# Patient Record
Sex: Female | Born: 1995 | Race: Black or African American | Hispanic: No | Marital: Single | Smoking: Former smoker
Health system: Southern US, Community
[De-identification: ages and names within clinical notes are randomized; demographics above are authoritative.]

## PROBLEM LIST (undated history)

## (undated) ENCOUNTER — Inpatient Hospital Stay (HOSPITAL_COMMUNITY): Payer: Self-pay

## (undated) DIAGNOSIS — D649 Anemia, unspecified: Secondary | ICD-10-CM

## (undated) DIAGNOSIS — Z9109 Other allergy status, other than to drugs and biological substances: Secondary | ICD-10-CM

## (undated) DIAGNOSIS — Z8759 Personal history of other complications of pregnancy, childbirth and the puerperium: Secondary | ICD-10-CM

## (undated) HISTORY — PX: NO PAST SURGERIES: SHX2092

## (undated) HISTORY — DX: Anemia, unspecified: D64.9

---
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
## 2017-01-09 ENCOUNTER — Encounter: Payer: Self-pay | Admitting: Family Medicine

## 2017-01-10 ENCOUNTER — Encounter: Payer: Medicaid Other | Admitting: Obstetrics and Gynecology

## 2017-01-24 ENCOUNTER — Ambulatory Visit (INDEPENDENT_AMBULATORY_CARE_PROVIDER_SITE_OTHER): Payer: Medicaid Other | Admitting: Obstetrics & Gynecology

## 2017-01-24 ENCOUNTER — Encounter: Payer: Self-pay | Admitting: Obstetrics & Gynecology

## 2017-01-24 VITALS — BP 118/71 | HR 76 | Wt 162.8 lb

## 2017-01-24 DIAGNOSIS — O09292 Supervision of pregnancy with other poor reproductive or obstetric history, second trimester: Secondary | ICD-10-CM

## 2017-01-24 DIAGNOSIS — O0990 Supervision of high risk pregnancy, unspecified, unspecified trimester: Secondary | ICD-10-CM

## 2017-01-24 DIAGNOSIS — Z8759 Personal history of other complications of pregnancy, childbirth and the puerperium: Secondary | ICD-10-CM

## 2017-01-24 DIAGNOSIS — O0992 Supervision of high risk pregnancy, unspecified, second trimester: Secondary | ICD-10-CM

## 2017-01-24 MED ORDER — PANTOPRAZOLE SODIUM 20 MG PO TBEC: 20.0000 mg | DELAYED_RELEASE_TABLET | Freq: Every day | ORAL | 4 refills | Status: DC

## 2017-01-24 NOTE — Patient Instructions (Signed)
Second Trimester of Pregnancy The second trimester is from week 13 through week 28, month 4 through 6. This is often the time in pregnancy that you feel your best. Often times, morning sickness has lessened or quit. You may have more energy, and you may get hungry more often. Your unborn baby (fetus) is growing rapidly. At the end of the sixth month, he or she is about 9 inches long and weighs about 1 pounds. You will likely feel the baby move (quickening) between 18 and 20 weeks of pregnancy. Follow these instructions at home:  Avoid all smoking, herbs, and alcohol. Avoid drugs not approved by your doctor.  Do not use any tobacco products, including cigarettes, chewing tobacco, and electronic cigarettes. If you need help quitting, ask your doctor. You may get counseling or other support to help you quit.  Only take medicine as told by your doctor. Some medicines are safe and some are not during pregnancy.  Exercise only as told by your doctor. Stop exercising if you start having cramps.  Eat regular, healthy meals.  Wear a good support bra if your breasts are tender.  Do not use hot tubs, steam rooms, or saunas.  Wear your seat belt when driving.  Avoid raw meat, uncooked cheese, and liter boxes and soil used by cats.  Take your prenatal vitamins.  Take 1500-2000 milligrams of calcium daily starting at the 20th week of pregnancy until you deliver your baby.  Try taking medicine that helps you poop (stool softener) as needed, and if your doctor approves. Eat more fiber by eating fresh fruit, vegetables, and whole grains. Drink enough fluids to keep your pee (urine) clear or pale yellow.  Take warm water baths (sitz baths) to soothe pain or discomfort caused by hemorrhoids. Use hemorrhoid cream if your doctor approves.  If you have puffy, bulging veins (varicose veins), wear support hose. Raise (elevate) your feet for 15 minutes, 3-4 times a day. Limit salt in your diet.  Avoid heavy  lifting, wear low heals, and sit up straight.  Rest with your legs raised if you have leg cramps or low back pain.  Visit your dentist if you have not gone during your pregnancy. Use a soft toothbrush to brush your teeth. Be gentle when you floss.  You can have sex (intercourse) unless your doctor tells you not to.  Go to your doctor visits. Get help if:  You feel dizzy.  You have mild cramps or pressure in your lower belly (abdomen).  You have a nagging pain in your belly area.  You continue to feel sick to your stomach (nauseous), throw up (vomit), or have watery poop (diarrhea).  You have bad smelling fluid coming from your vagina.  You have pain with peeing (urination). Get help right away if:  You have a fever.  You are leaking fluid from your vagina.  You have spotting or bleeding from your vagina.  You have severe belly cramping or pain.  You lose or gain weight rapidly.  You have trouble catching your breath and have chest pain.  You notice sudden or extreme puffiness (swelling) of your face, hands, ankles, feet, or legs.  You have not felt the baby move in over an hour.  You have severe headaches that do not go away with medicine.  You have vision changes. This information is not intended to replace advice given to you by your health care provider. Make sure you discuss any questions you have with your health care   provider. Document Released: 10/20/2009 Document Revised: 01/01/2016 Document Reviewed: 09/26/2012 Elsevier Interactive Patient Education  2017 Elsevier Inc.  

## 2017-01-26 ENCOUNTER — Other Ambulatory Visit: Payer: Self-pay | Admitting: *Deleted

## 2017-01-26 ENCOUNTER — Encounter: Payer: Self-pay | Admitting: Obstetrics & Gynecology

## 2017-01-26 DIAGNOSIS — O0990 Supervision of high risk pregnancy, unspecified, unspecified trimester: Secondary | ICD-10-CM

## 2017-01-26 DIAGNOSIS — Z8759 Personal history of other complications of pregnancy, childbirth and the puerperium: Secondary | ICD-10-CM

## 2017-02-01 ENCOUNTER — Ambulatory Visit (HOSPITAL_COMMUNITY)
Admission: RE | Admit: 2017-02-01 | Discharge: 2017-02-01 | Disposition: A | Discharge status: Home / Self Care | Payer: Medicaid Other | Source: Ambulatory Visit | Attending: Obstetrics & Gynecology | Admitting: Obstetrics & Gynecology

## 2017-02-01 ENCOUNTER — Encounter (HOSPITAL_COMMUNITY): Payer: Self-pay

## 2017-02-01 DIAGNOSIS — O0992 Supervision of high risk pregnancy, unspecified, second trimester: Secondary | ICD-10-CM | POA: Diagnosis not present

## 2017-02-01 DIAGNOSIS — Z3A19 19 weeks gestation of pregnancy: Secondary | ICD-10-CM | POA: Insufficient documentation

## 2017-02-01 DIAGNOSIS — Z8759 Personal history of other complications of pregnancy, childbirth and the puerperium: Secondary | ICD-10-CM | POA: Diagnosis not present

## 2017-02-01 DIAGNOSIS — O0990 Supervision of high risk pregnancy, unspecified, unspecified trimester: Secondary | ICD-10-CM

## 2017-02-24 ENCOUNTER — Ambulatory Visit (INDEPENDENT_AMBULATORY_CARE_PROVIDER_SITE_OTHER): Payer: Self-pay | Admitting: Clinical

## 2017-02-24 ENCOUNTER — Ambulatory Visit (INDEPENDENT_AMBULATORY_CARE_PROVIDER_SITE_OTHER): Payer: Self-pay | Admitting: Obstetrics & Gynecology

## 2017-02-24 VITALS — BP 120/72 | HR 84 | Wt 170.1 lb

## 2017-02-24 DIAGNOSIS — F419 Anxiety disorder, unspecified: Secondary | ICD-10-CM

## 2017-02-24 DIAGNOSIS — O0990 Supervision of high risk pregnancy, unspecified, unspecified trimester: Secondary | ICD-10-CM

## 2017-02-24 DIAGNOSIS — O0992 Supervision of high risk pregnancy, unspecified, second trimester: Secondary | ICD-10-CM

## 2017-02-24 DIAGNOSIS — F4322 Adjustment disorder with anxiety: Secondary | ICD-10-CM

## 2017-02-24 DIAGNOSIS — Z8759 Personal history of other complications of pregnancy, childbirth and the puerperium: Secondary | ICD-10-CM

## 2017-02-24 NOTE — Progress Notes (Signed)
   PRENATAL VISIT NOTE  Subjective:  Emily Cain is a 21 y.o. G2P1000 at 5230w4d being seen today for ongoing prenatal care.  She is currently monitored for the following issues for this high-risk pregnancy and has History of IUFD; Supervision of high risk pregnancy, antepartum; and GBS (group B streptococcus) UTI complicating pregnancy on her problem list.  Patient reports intermittent headache and constipation. Her HA have been daily for x3 weeks and are described in a tension-like distribution to bilateral forehead. She has not been taking any medications for HA, and has been able to sleep. Her HA are similar to HA experienced in previous pregnancy. No h/o preeclampsia. No changes in vision or hearing. Patient reports constipation as well, with associated hemorrhoid and occasional blood with wiping. She is not particularly concerned with this.  .  .  Movement: Present. Denies leaking of fluid.   She is interested in Nexplanon following pregnancy. Patient is uncertain if she wants to breastfeed, but is open to it.  The following portions of the patient's history were reviewed and updated as appropriate: allergies, current medications, past family history, past medical history, past social history, past surgical history and problem list. Problem list updated.  Objective:   Vitals:   02/24/17 0958  BP: 120/72  Pulse: 84  Weight: 170 lb 1.6 oz (77.2 kg)    Fetal Status: Fetal Heart Rate (bpm): 153   Movement: Present     General:  Alert, oriented and cooperative. Patient is in no acute distress.  Skin: Skin is warm and dry. No rash noted.   Cardiovascular: Normal heart rate noted  Respiratory: Normal respiratory effort, no problems with respiration noted  Abdomen: Soft, gravid, appropriate for gestational age 77(22cm FH).  Pain/Pressure: Absent     Pelvic: Cervical exam deferred        Extremities: Normal range of motion.  Edema: None  Mental Status:  Normal mood and affect. Normal  behavior. Normal judgment and thought content.   Assessment and Plan:  Pregnancy: G2P1000 at 5730w4d  1. Anxiety Patient remains anxious regarding current pregnancy secondary to outcome of first pregnancy (stillbirth at full term). She is amenable with referral to onsite integrated behavioral health clinician Asher Muir(Jamie). - Ambulatory referral to Integrated Behavioral Health  2. Supervision of high risk pregnancy, antepartum Patient is overall healthy and progressing well through this pregnancy. She will continue to have U/S monthly at 24 weeks, 28 weeks, 32 weeks, and 36 weeks according to hospital guidelines for previous stillbirth. Patient will have NST/AFI BPP weekly starting at 28 weeks, and then twice weekly starting at 32 weeks. She is to deliver at 39 weeks. Patient is aware and amenable to plan. - US MFM OB FOLLOW UP; Future - Encourage breastfeeding.  3. History of IUFD Patient currently progressing well through pregnancy, and will continue to monitor as mentioned above. - US MFM OB FOLLOW UP; Future  Term/preterm labor symptoms and general obstetric precautions including but not limited to vaginal bleeding, contractions, leaking of fluid and fetal movement were reviewed in detail with the patient. Please refer to After Visit Summary for other counseling recommendations.  Return in about 4 weeks (around 03/24/2017) for 2 hr GTT.   Lucendia HerrlichLaura Jahlen Bollman, [place] NeighborStudent-PA

## 2017-02-24 NOTE — Patient Instructions (Signed)

## 2017-02-24 NOTE — BH Specialist Note (Signed)
Integrated Behavioral Health Initial Visit  MRN: [id number] Name: Emily Cain   Session Start time: 11:00 Session End time:11:30 Total time: 30 minutes  Type of Service: Integrated Behavioral Health- Individual/Family Interpretor:No. Interpretor Name and Language: n/a   Warm Hand Off Completed.       SUBJECTIVE: Emily RuddKenyadia Dowers is a 21 y.o. female accompanied by patient. Patient was referred by Dr Debroah LoopArnold for anxiety. Patient reports the following symptoms/concerns: Pt states her primary concern is relationship with her mother causes her to feel stressed and anxious, and does not want it to negatively affect her baby; open to learning coping strategy. Has coped with feelings by taking a walk, but does not feel safe walking at night. Duration of problem: Current pregnancy; Severity of problem: mild  OBJECTIVE: Mood: Anxious and Affect: Appropriate Risk of harm to self or others: No plan to harm self or others   LIFE CONTEXT: Family and Social: Lives with mother; has one sister, one brother(cousin raised as her brother); [place] School[place]Work[place] Studying for GED Self-Care: Some walking, does not want to continue Life Changes: Current pregnancy  GOALS ADDRESSED: Patient will reduce symptoms of: anxiety and increase knowledge and/or ability of: self-management skills and also: Increase healthy adjustment to current life circumstances and Increase adequate support systems for patient/family   INTERVENTIONS: Mindfulness or Relaxation Training, Psychoeducation and/or Health Education and Link to [facility]  Standardized Assessments completed: GAD-7 and PHQ 9  ASSESSMENT: Patient currently experiencing Adjustment disorder with anxious mood. Patient may benefit from psychoeducation and brief therapeutic interventions regarding coping with symptoms of anxiety.Marland Kitchen.  PLAN: 1. Follow up with behavioral health clinician on : One month 2. Behavioral recommendations:   -Obtain headphones today, prior to road trip; try out calming apps as self-coping strategy -CALM relaxation breathing exercise daily -Read educational material regarding coping with symptoms of anxiety with panic attack -Consider [employer] [facility] for additional social support 3. Referral(s): [facility]) and [facility]:  [facility] 4. "From scale of 1-10, how likely are you to follow plan?": 8  Rae LipsJamie C Vylette Strubel, LCSWA   Depression screen [facility] 2/9 01/24/2017 12/13/2016 05/03/2016 04/16/2016 03/29/2016  Decreased Interest 0 0 0 0 0  Down, Depressed, Hopeless 0 0 0 0 0  PHQ - 2 Score 0 0 0 0 0  Altered sleeping 0 0 0 0 0  Tired, decreased energy 0 0 0 0 0  Change in appetite 0 0 0 0 0  Feeling bad or failure about yourself  0 0 0 0 0  Trouble concentrating 0 0 0 0 0  Moving slowly or fidgety/restless 0 0 0 0 0  Suicidal thoughts 0 0 0 0 0  PHQ-9 Score 0 0 0 0 0   GAD 7 : Generalized Anxiety Score 01/24/2017 12/13/2016 05/03/2016 04/16/2016  Nervous, Anxious, on Edge 0 0 0 0  Control/stop worrying 0 0 0 0  Worry too much - different things 0 0 0 0  Trouble relaxing 0 0 0 0  Restless 0 0 0 0  Easily annoyed or irritable 0 0 0 0  Afraid - awful might happen 0 0 0 0  Total GAD 7 Score 0 0 0 0  Anxiety Difficulty - - - -

## 2017-03-03 ENCOUNTER — Other Ambulatory Visit: Payer: Self-pay | Admitting: General Practice

## 2017-03-03 ENCOUNTER — Other Ambulatory Visit (HOSPITAL_COMMUNITY): Payer: Self-pay | Admitting: *Deleted

## 2017-03-03 ENCOUNTER — Other Ambulatory Visit (HOSPITAL_COMMUNITY): Payer: Self-pay | Admitting: Obstetrics and Gynecology

## 2017-03-03 ENCOUNTER — Other Ambulatory Visit: Payer: Self-pay | Admitting: Obstetrics & Gynecology

## 2017-03-03 ENCOUNTER — Ambulatory Visit (HOSPITAL_COMMUNITY)
Admission: RE | Admit: 2017-03-03 | Discharge: 2017-03-03 | Disposition: A | Discharge status: Home / Self Care | Payer: Medicaid Other | Source: Ambulatory Visit | Attending: Obstetrics & Gynecology | Admitting: Obstetrics & Gynecology

## 2017-03-03 ENCOUNTER — Encounter: Payer: Self-pay | Admitting: Obstetrics & Gynecology

## 2017-03-03 ENCOUNTER — Encounter (HOSPITAL_COMMUNITY): Payer: Self-pay

## 2017-03-03 DIAGNOSIS — O0990 Supervision of high risk pregnancy, unspecified, unspecified trimester: Secondary | ICD-10-CM | POA: Insufficient documentation

## 2017-03-03 DIAGNOSIS — Z8759 Personal history of other complications of pregnancy, childbirth and the puerperium: Secondary | ICD-10-CM | POA: Insufficient documentation

## 2017-03-03 DIAGNOSIS — Z3A23 23 weeks gestation of pregnancy: Secondary | ICD-10-CM | POA: Diagnosis present

## 2017-03-03 DIAGNOSIS — O364XX0 Maternal care for intrauterine death, not applicable or unspecified: Secondary | ICD-10-CM

## 2017-03-03 DIAGNOSIS — O2242 Hemorrhoids in pregnancy, second trimester: Secondary | ICD-10-CM

## 2017-03-03 MED ORDER — HYDROCORTISONE 2.5 % RE CREA: 1.0000 "application " | TOPICAL_CREAM | Freq: Two times a day (BID) | RECTAL | 0 refills | Status: DC

## 2017-03-03 MED ORDER — ASPIRIN EC 81 MG PO TBEC: 81.0000 mg | DELAYED_RELEASE_TABLET | Freq: Every day | ORAL | 3 refills | Status: DC

## 2017-03-23 ENCOUNTER — Encounter (HOSPITAL_COMMUNITY): Payer: Self-pay | Admitting: *Deleted

## 2017-03-23 ENCOUNTER — Inpatient Hospital Stay (HOSPITAL_COMMUNITY)
Admission: AD | Admit: 2017-03-23 | Discharge: 2017-03-23 | Disposition: A | Discharge status: Home / Self Care | Payer: Medicaid Other | Source: Ambulatory Visit | Attending: Family Medicine | Admitting: Family Medicine

## 2017-03-23 DIAGNOSIS — O99352 Diseases of the nervous system complicating pregnancy, second trimester: Secondary | ICD-10-CM | POA: Diagnosis not present

## 2017-03-23 DIAGNOSIS — O9989 Other specified diseases and conditions complicating pregnancy, childbirth and the puerperium: Secondary | ICD-10-CM

## 2017-03-23 DIAGNOSIS — O98812 Other maternal infectious and parasitic diseases complicating pregnancy, second trimester: Secondary | ICD-10-CM | POA: Insufficient documentation

## 2017-03-23 DIAGNOSIS — O0990 Supervision of high risk pregnancy, unspecified, unspecified trimester: Secondary | ICD-10-CM

## 2017-03-23 DIAGNOSIS — Z7982 Long term (current) use of aspirin: Secondary | ICD-10-CM | POA: Insufficient documentation

## 2017-03-23 DIAGNOSIS — Z3A26 26 weeks gestation of pregnancy: Secondary | ICD-10-CM | POA: Insufficient documentation

## 2017-03-23 DIAGNOSIS — G43909 Migraine, unspecified, not intractable, without status migrainosus: Secondary | ICD-10-CM | POA: Insufficient documentation

## 2017-03-23 DIAGNOSIS — B373 Candidiasis of vulva and vagina: Secondary | ICD-10-CM | POA: Diagnosis not present

## 2017-03-23 DIAGNOSIS — N898 Other specified noninflammatory disorders of vagina: Secondary | ICD-10-CM | POA: Diagnosis present

## 2017-03-23 DIAGNOSIS — B379 Candidiasis, unspecified: Secondary | ICD-10-CM | POA: Diagnosis not present

## 2017-03-23 LAB — URINALYSIS, ROUTINE W REFLEX MICROSCOPIC
BILIRUBIN URINE: NEGATIVE
Bacteria, UA: NONE SEEN
GLUCOSE, UA: NEGATIVE mg/dL
Hgb urine dipstick: NEGATIVE
KETONES UR: NEGATIVE mg/dL
NITRITE: NEGATIVE
PH: 6 (ref 5.0–8.0)
Protein, ur: NEGATIVE mg/dL
Specific Gravity, Urine: 1.026 (ref 1.005–1.030)

## 2017-03-23 LAB — WET PREP, GENITAL
CLUE CELLS WET PREP: NONE SEEN
Sperm: NONE SEEN
Trich, Wet Prep: NONE SEEN

## 2017-03-23 MED ORDER — FLUCONAZOLE 150 MG PO TABS: 150.0000 mg | ORAL_TABLET | Freq: Once | ORAL | 0 refills | Status: AC

## 2017-03-23 MED ORDER — BUTALBITAL-APAP-CAFFEINE 50-325-40 MG PO CAPS: 1.0000 | ORAL_CAPSULE | Freq: Four times a day (QID) | ORAL | 3 refills | Status: DC | PRN

## 2017-03-23 MED ORDER — BUTALBITAL-APAP-CAFFEINE 50-325-40 MG PO TABS
1.0000 | ORAL_TABLET | Freq: Once | ORAL | Status: AC
  Administered 2017-03-23: 1 via ORAL
  Filled 2017-03-23: qty 1

## 2017-03-23 NOTE — Discharge Instructions (Signed)
Migraine Headache A migraine headache is an intense, throbbing pain on one side or both sides of the head. Migraines may also cause other symptoms, such as nausea, vomiting, and sensitivity to light and noise. What are the causes? Doing or taking certain things may also trigger migraines, such as:  Alcohol.  Smoking.  Medicines, such as: ? Medicine used to treat chest pain (nitroglycerine). ? Birth control pills. ? Estrogen pills. ? Certain blood pressure medicines.  Aged cheeses, chocolate, or caffeine.  Foods or drinks that contain nitrates, glutamate, aspartame, or tyramine.  Physical activity.  Other things that may trigger a migraine include:  Menstruation.  Pregnancy.  Hunger.  Stress, lack of sleep, too much sleep, or fatigue.  Weather changes.  What increases the risk? The following factors may make you more likely to experience migraine headaches:  Age. Risk increases with age.  Family history of migraine headaches.  Being Caucasian.  Depression and anxiety.  Obesity.  Being a woman.  Having a hole in the heart (patent foramen ovale) or other heart problems.  What are the signs or symptoms? The main symptom of this condition is pulsating or throbbing pain. Pain may:  Happen in any area of the head, such as on one side or both sides.  Interfere with daily activities.  Get worse with physical activity.  Get worse with exposure to bright lights or loud noises.  Other symptoms may include:  Nausea.  Vomiting.  Dizziness.  General sensitivity to bright lights, loud noises, or smells.  Before you get a migraine, you may get warning signs that a migraine is developing (aura). An aura may include:  Seeing flashing lights or having blind spots.  Seeing bright spots, halos, or zigzag lines.  Having tunnel vision or blurred vision.  Having numbness or a tingling feeling.  Having trouble talking.  Having muscle weakness.  How is this  diagnosed? A migraine headache can be diagnosed based on:  Your symptoms.  A physical exam.  Tests, such as CT scan or MRI of the head. These imaging tests can help rule out other causes of headaches.  Taking fluid from the spine (lumbar puncture) and analyzing it (cerebrospinal fluid analysis, or CSF analysis).  How is this treated? A migraine headache is usually treated with medicines that:  Relieve pain.  Relieve nausea.  Prevent migraines from coming back.  Treatment may also include:  Acupuncture.  Lifestyle changes like avoiding foods that trigger migraines.  Follow these instructions at home: Medicines  Take over-the-counter and prescription medicines only as told by your health care provider.  Do not drive or use heavy machinery while taking prescription pain medicine.  To prevent or treat constipation while you are taking prescription pain medicine, your health care provider may recommend that you: ? Drink enough fluid to keep your urine clear or pale yellow. ? Take over-the-counter or prescription medicines. ? Eat foods that are high in fiber, such as fresh fruits and vegetables, whole grains, and beans. ? Limit foods that are high in fat and processed sugars, such as fried and sweet foods. Lifestyle  Avoid alcohol use.  Do not use any products that contain nicotine or tobacco, such as cigarettes and e-cigarettes. If you need help quitting, ask your health care provider.  Get at least 8 hours of sleep every night.  Limit your stress. General instructions   Keep a journal to find out what may trigger your migraine headaches. For example, write down: ? What you eat and   drink. ? How much sleep you get. ? Any change to your diet or medicines.  If you have a migraine: ? Avoid things that make your symptoms worse, such as bright lights. ? It may help to lie down in a dark, quiet room. ? Do not drive or use heavy machinery. ? Ask your health care provider  what activities are safe for you while you are experiencing symptoms.  Keep all follow-up visits as told by your health care provider. This is important. Contact a health care provider if:  You develop symptoms that are different or more severe than your usual migraine symptoms. Get help right away if:  Your migraine becomes severe.  You have a fever.  You have a stiff neck.  You have vision loss.  Your muscles feel weak or like you cannot control them.  You start to lose your balance often.  You develop trouble walking.  You faint. This information is not intended to replace advice given to you by your health care provider. Make sure you discuss any questions you have with your health care provider. Document Released: 07/26/2005 Document Revised: 02/13/2016 Document Reviewed: 01/12/2016 Elsevier Interactive Patient Education  2017 [employer]   

## 2017-03-23 NOTE — [facility] Note (Signed)
Pt has had vaginal itching and irritation for three days and has had some minor spotting today.  Also gets bad headaches when she is pregnant.  Denies cramping.

## 2017-03-23 NOTE — [facility] Provider Note (Addendum)
History     CSN: [id number]  Arrival date and time: 03/23/17 1540   None     Chief Complaint  Patient presents with  . Vaginal Itching  . Vaginal Discharge   HPI 21 yo G2P1000 at 5555w3d here for the evaluation of vaginal pruritis for the past 3 days. She reports douching a few days prior. She denies any abnormal discharge. Patient also reports a persistent headache not relieved by Tylenol. She reports experiencing headaches since the beginning of her pregnancy. She experiences light sensitivity. She falls a sleep with the headache and often wakes up with a dull headache. The headache is located on her left temporal side. She denies any HA, visual changes, RUQ/epigastric pain, nausea or emesis. She reports staying well hydrated. She reports good fetal movement. She denies vaginal bleeding, leakage of fluid or contractions  OB History    Gravida Para Term Preterm AB Living   2 1 1  0 0 0   SAB TAB Ectopic Multiple Live Births   0 0 0 0        Past Medical History:  Diagnosis Date  . Anemia   . Environmental allergies   . Medical history non-contributory     Past Surgical History:  Procedure Laterality Date  . NO PAST SURGERIES      Family History  Problem Relation Age of Onset  . Cancer Maternal Aunt   . Cancer Maternal Uncle   . Cancer Maternal Grandmother     Social History  Substance Use Topics  . Smoking status: Never Smoker  . Smokeless tobacco: Never Used  . Alcohol use No    Allergies: No Known Allergies  Prescriptions Prior to Admission  Medication Sig Dispense Refill Last Dose  . aspirin EC 81 MG tablet Take 1 tablet (81 mg total) by mouth daily. 30 tablet 3   . hydrocortisone (ANUSOL-HC) 2.5 % rectal cream Place 1 application rectally 2 (two) times daily. 30 g 0   . ondansetron (ZOFRAN) 4 MG tablet Take 1 tablet (4 mg total) by mouth every 6 (six) hours as needed for nausea or vomiting. (Patient not taking: Reported on 12/13/2016) 12 tablet 0 Not Taking   . pantoprazole (PROTONIX) 20 MG tablet Take 1 tablet (20 mg total) by mouth daily. 30 tablet 4 Taking  . Prenatal Vit-Fe Fumarate-FA (PRENATAL VITAMIN PO) Take by mouth.   Taking  . promethazine (PHENERGAN) 25 MG tablet Take 1 tablet (25 mg total) by mouth every 6 (six) hours as needed for nausea or vomiting. (Patient not taking: Reported on 12/13/2016) 30 tablet 1 Not Taking    Review of Systems  See pertinent in HPI Physical Exam   Blood pressure 122/73, pulse 86, temperature 97.8 F (36.6 C), temperature source Oral, resp. rate 16, last menstrual period 10/08/2016, SpO2 99 %.  Physical Exam  GENERAL: Well-developed, well-nourished female in no acute distress.  ABDOMEN: Soft, nontender, gravid PELVIC: Normal external female genitalia with labia majora excoriations EXTREMITIES: No cyanosis, clubbing, or edema, 2+ distal pulses.  FHT: baseline 135, mod variability, + accels, no decels Toco: no contractions MAU Course  Procedures  MDM Results for orders placed or performed during the hospital encounter of 03/23/17 (from the past 24 hour(s))  Urinalysis, Routine w reflex microscopic     Status: Abnormal   Collection Time: 03/23/17  3:58 PM  Result Value Ref Range   Color, Urine YELLOW YELLOW   APPearance CLEAR CLEAR   Specific Gravity, Urine 1.026 1.005 - 1.030  pH 6.0 5.0 - 8.0   Glucose, UA NEGATIVE NEGATIVE mg/dL   Hgb urine dipstick NEGATIVE NEGATIVE   Bilirubin Urine NEGATIVE NEGATIVE   Ketones, ur NEGATIVE NEGATIVE mg/dL   Protein, ur NEGATIVE NEGATIVE mg/dL   Nitrite NEGATIVE NEGATIVE   Leukocytes, UA SMALL (A) NEGATIVE   RBC / HPF 0-5 0 - 5 RBC/hpf   WBC, UA 0-5 0 - 5 WBC/hpf   Bacteria, UA NONE SEEN NONE SEEN   Squamous Epithelial / LPF 0-5 (A) NONE SEEN   Mucous PRESENT   Wet prep, genital     Status: Abnormal   Collection Time: 03/23/17  4:15 PM  Result Value Ref Range   Yeast Wet Prep HPF POC PRESENT (A) NONE SEEN   Trich, Wet Prep NONE SEEN NONE SEEN    Clue Cells Wet Prep HPF POC NONE SEEN NONE SEEN   WBC, Wet Prep HPF POC MANY (A) NONE SEEN   Sperm NONE SEEN     Assessment and Plan  21 yo G2P1000 at [redacted]w[redacted]d with yeast infection and migraine headache - Rx diflucan provided - Rx fioricet - Follow up as scheduled for routine prenatal care  Khai Arrona 03/23/2017, 4:46 PM

## 2017-03-24 ENCOUNTER — Ambulatory Visit (INDEPENDENT_AMBULATORY_CARE_PROVIDER_SITE_OTHER): Payer: Self-pay | Admitting: Obstetrics & Gynecology

## 2017-03-24 ENCOUNTER — Ambulatory Visit (INDEPENDENT_AMBULATORY_CARE_PROVIDER_SITE_OTHER): Payer: Self-pay | Admitting: Clinical

## 2017-03-24 ENCOUNTER — Encounter: Payer: Self-pay | Admitting: Obstetrics & Gynecology

## 2017-03-24 VITALS — BP 116/69 | HR 83 | Wt 177.3 lb

## 2017-03-24 DIAGNOSIS — O0992 Supervision of high risk pregnancy, unspecified, second trimester: Secondary | ICD-10-CM

## 2017-03-24 DIAGNOSIS — Z8759 Personal history of other complications of pregnancy, childbirth and the puerperium: Secondary | ICD-10-CM

## 2017-03-24 DIAGNOSIS — Z8659 Personal history of other mental and behavioral disorders: Secondary | ICD-10-CM

## 2017-03-24 DIAGNOSIS — O0990 Supervision of high risk pregnancy, unspecified, unspecified trimester: Secondary | ICD-10-CM

## 2017-03-24 NOTE — Progress Notes (Signed)
   PRENATAL VISIT NOTE  Subjective:  Emily Cain is a 21 y.o. G2P1000 at 7664w4d being seen today for ongoing prenatal care.  She is currently monitored for the following issues for this high-risk pregnancy and has History of IUFD; Supervision of high risk pregnancy, antepartum; and GBS (group B streptococcus) UTI complicating pregnancy on her problem list.  Patient reports no complaints.  Contractions: Not present. Vag. Bleeding: None.  Movement: Present. Denies leaking of fluid.   The following portions of the patient's history were reviewed and updated as appropriate: allergies, current medications, past family history, past medical history, past social history, past surgical history and problem list. Problem list updated.  Objective:   Vitals:   03/24/17 0802  BP: 116/69  Pulse: 83  Weight: 177 lb 4.8 oz (80.4 kg)    Fetal Status: Fetal Heart Rate (bpm): 150   Movement: Present     General:  Alert, oriented and cooperative. Patient is in no acute distress.  Skin: Skin is warm and dry. No rash noted.   Cardiovascular: Normal heart rate noted  Respiratory: Normal respiratory effort, no problems with respiration noted  Abdomen: Soft, gravid, appropriate for gestational age.  Pain/Pressure: Absent     Pelvic: Cervical exam deferred        Extremities: Normal range of motion.  Edema: Trace  Mental Status:  Normal mood and affect. Normal behavior. Normal judgment and thought content.   Assessment and Plan:  Pregnancy: G2P1000 at 4864w4d  1. Supervision of high risk pregnancy, antepartum US aPPT IS SCHEDULED - Glucose Tolerance, 2 Hours w/1 Hour - HIV antibody - RPR - CBC  2. History of IUFD F/u US  Preterm labor symptoms and general obstetric precautions including but not limited to vaginal bleeding, contractions, leaking of fluid and fetal movement were reviewed in detail with the patient. Please refer to After Visit Summary for other counseling recommendations.  Return in  about 2 weeks (around 04/07/2017).   Scheryl DarterJames Anjeanette Petzold, MD

## 2017-03-24 NOTE — Progress Notes (Signed)
Patient here today for Routine OB visit. Patient states she was at [facility] 8/15.18 for a yeast infection, which she is using Monistat to treat it.

## 2017-03-24 NOTE — Patient Instructions (Signed)
Third Trimester of Pregnancy The third trimester is from week 28 through week 40 (months 7 through 9). The third trimester is a time when the unborn baby (fetus) is growing rapidly. At the end of the ninth month, the fetus is about 20 inches in length and weighs 6-10 pounds. Body changes during your third trimester Your body will continue to go through many changes during pregnancy. The changes vary from woman to woman. During the third trimester:  Your weight will continue to increase. You can expect to gain 25-35 pounds (11-16 kg) by the end of the pregnancy.  You may begin to get stretch marks on your hips, abdomen, and breasts.  You may urinate more often because the fetus is moving lower into your pelvis and pressing on your bladder.  You may develop or continue to have heartburn. This is caused by increased hormones that slow down muscles in the digestive tract.  You may develop or continue to have constipation because increased hormones slow digestion and cause the muscles that push waste through your intestines to relax.  You may develop hemorrhoids. These are swollen veins (varicose veins) in the rectum that can itch or be painful.  You may develop swollen, bulging veins (varicose veins) in your legs.  You may have increased body aches in the pelvis, back, or thighs. This is due to weight gain and increased hormones that are relaxing your joints.  You may have changes in your hair. These can include thickening of your hair, rapid growth, and changes in texture. Some women also have hair loss during or after pregnancy, or hair that feels dry or thin. Your hair will most likely return to normal after your baby is born.  Your breasts will continue to grow and they will continue to become tender. A yellow fluid (colostrum) may leak from your breasts. This is the first milk you are producing for your baby.  Your belly button may stick out.  You may notice more swelling in your hands,  face, or ankles.  You may have increased tingling or numbness in your hands, arms, and legs. The skin on your belly may also feel numb.  You may feel short of breath because of your expanding uterus.  You may have more problems sleeping. This can be caused by the size of your belly, increased need to urinate, and an increase in your body's metabolism.  You may notice the fetus "dropping," or moving lower in your abdomen (lightening).  You may have increased vaginal discharge.  You may notice your joints feel loose and you may have pain around your pelvic bone.  What to expect at prenatal visits You will have prenatal exams every 2 weeks until week 36. Then you will have weekly prenatal exams. During a routine prenatal visit:  You will be weighed to make sure you and the baby are growing normally.  Your blood pressure will be taken.  Your abdomen will be measured to track your baby's growth.  The fetal heartbeat will be listened to.  Any test results from the previous visit will be discussed.  You may have a cervical check near your due date to see if your cervix has softened or thinned (effaced).  You will be tested for Group B streptococcus. This happens between 35 and 37 weeks.  Your health care provider may ask you:  What your birth plan is.  How you are feeling.  If you are feeling the baby move.  If you have had   any abnormal symptoms, such as leaking fluid, bleeding, severe headaches, or abdominal cramping.  If you are using any tobacco products, including cigarettes, chewing tobacco, and electronic cigarettes.  If you have any questions.  Other tests or screenings that may be performed during your third trimester include:  Blood tests that check for low iron levels (anemia).  Fetal testing to check the health, activity level, and growth of the fetus. Testing is done if you have certain medical conditions or if there are problems during the  pregnancy.  Nonstress test (NST). This test checks the health of your baby to make sure there are no signs of problems, such as the baby not getting enough oxygen. During this test, a belt is placed around your belly. The baby is made to move, and its heart rate is monitored during movement.  What is false labor? False labor is a condition in which you feel small, irregular tightenings of the muscles in the womb (contractions) that usually go away with rest, changing position, or drinking water. These are called Braxton Hicks contractions. Contractions may last for hours, days, or even weeks before true labor sets in. If contractions come at regular intervals, become more frequent, increase in intensity, or become painful, you should see your health care provider. What are the signs of labor?  Abdominal cramps.  Regular contractions that start at 10 minutes apart and become stronger and more frequent with time.  Contractions that start on the top of the uterus and spread down to the lower abdomen and back.  Increased pelvic pressure and dull back pain.  A watery or bloody mucus discharge that comes from the vagina.  Leaking of amniotic fluid. This is also known as your "water breaking." It could be a slow trickle or a gush. Let your health care provider know if it has a color or strange odor. If you have any of these signs, call your health care provider right away, even if it is before your due date. Follow these instructions at home: Medicines  Follow your health care provider's instructions regarding medicine use. Specific medicines may be either safe or unsafe to take during pregnancy.  Take a prenatal vitamin that contains at least 600 micrograms (mcg) of folic acid.  If you develop constipation, try taking a stool softener if your health care provider approves. Eating and drinking  Eat a balanced diet that includes fresh fruits and vegetables, whole grains, good sources of protein  such as meat, eggs, or tofu, and low-fat dairy. Your health care provider will help you determine the amount of weight gain that is right for you.  Avoid raw meat and uncooked cheese. These carry germs that can cause birth defects in the baby.  If you have low calcium intake from food, talk to your health care provider about whether you should take a daily calcium supplement.  Eat four or five small meals rather than three large meals a day.  Limit foods that are high in fat and processed sugars, such as fried and sweet foods.  To prevent constipation: ? Drink enough fluid to keep your urine clear or pale yellow. ? Eat foods that are high in fiber, such as fresh fruits and vegetables, whole grains, and beans. Activity  Exercise only as directed by your health care provider. Most women can continue their usual exercise routine during pregnancy. Try to exercise for 30 minutes at least 5 days a week. Stop exercising if you experience uterine contractions.  Avoid heavy   lifting.  Do not exercise in extreme heat or humidity, or at high altitudes.  Wear low-heel, comfortable shoes.  Practice good posture.  You may continue to have sex unless your health care provider tells you otherwise. Relieving pain and discomfort  Take frequent breaks and rest with your legs elevated if you have leg cramps or low back pain.  Take warm sitz baths to soothe any pain or discomfort caused by hemorrhoids. Use hemorrhoid cream if your health care provider approves.  Wear a good support bra to prevent discomfort from breast tenderness.  If you develop varicose veins: ? Wear support pantyhose or compression stockings as told by your healthcare provider. ? Elevate your feet for 15 minutes, 3-4 times a day. Prenatal care  Write down your questions. Take them to your prenatal visits.  Keep all your prenatal visits as told by your health care provider. This is important. Safety  Wear your seat belt at  all times when driving.  Make a list of emergency phone numbers, including numbers for family, friends, the hospital, and police and fire departments. General instructions  Avoid cat litter boxes and soil used by cats. These carry germs that can cause birth defects in the baby. If you have a cat, ask someone to clean the litter box for you.  Do not travel far distances unless it is absolutely necessary and only with the approval of your health care provider.  Do not use hot tubs, steam rooms, or saunas.  Do not drink alcohol.  Do not use any products that contain nicotine or tobacco, such as cigarettes and e-cigarettes. If you need help quitting, ask your health care provider.  Do not use any medicinal herbs or unprescribed drugs. These chemicals affect the formation and growth of the baby.  Do not douche or use tampons or scented sanitary pads.  Do not cross your legs for long periods of time.  To prepare for the arrival of your baby: ? Take prenatal classes to understand, practice, and ask questions about labor and delivery. ? Make a trial run to the hospital. ? Visit the hospital and tour the maternity area. ? Arrange for maternity or paternity leave through employers. ? Arrange for family and friends to take care of pets while you are in the hospital. ? Purchase a rear-facing car seat and make sure you know how to install it in your car. ? Pack your hospital bag. ? Prepare the baby's nursery. Make sure to remove all pillows and stuffed animals from the baby's crib to prevent suffocation.  Visit your dentist if you have not gone during your pregnancy. Use a soft toothbrush to brush your teeth and be gentle when you floss. Contact a health care provider if:  You are unsure if you are in labor or if your water has broken.  You become dizzy.  You have mild pelvic cramps, pelvic pressure, or nagging pain in your abdominal area.  You have lower back pain.  You have persistent  nausea, vomiting, or diarrhea.  You have an unusual or bad smelling vaginal discharge.  You have pain when you urinate. Get help right away if:  Your water breaks before 37 weeks.  You have regular contractions less than 5 minutes apart before 37 weeks.  You have a fever.  You are leaking fluid from your vagina.  You have spotting or bleeding from your vagina.  You have severe abdominal pain or cramping.  You have rapid weight loss or weight gain.    You have shortness of breath with chest pain.  You notice sudden or extreme swelling of your face, hands, ankles, feet, or legs.  Your baby makes fewer than 10 movements in 2 hours.  You have severe headaches that do not go away when you take medicine.  You have vision changes. Summary  The third trimester is from week 28 through week 40, months 7 through 9. The third trimester is a time when the unborn baby (fetus) is growing rapidly.  During the third trimester, your discomfort may increase as you and your baby continue to gain weight. You may have abdominal, leg, and back pain, sleeping problems, and an increased need to urinate.  During the third trimester your breasts will keep growing and they will continue to become tender. A yellow fluid (colostrum) may leak from your breasts. This is the first milk you are producing for your baby.  False labor is a condition in which you feel small, irregular tightenings of the muscles in the womb (contractions) that eventually go away. These are called Braxton Hicks contractions. Contractions may last for hours, days, or even weeks before true labor sets in.  Signs of labor can include: abdominal cramps; regular contractions that start at 10 minutes apart and become stronger and more frequent with time; watery or bloody mucus discharge that comes from the vagina; increased pelvic pressure and dull back pain; and leaking of amniotic fluid. This information is not intended to replace advice  given to you by your health care provider. Make sure you discuss any questions you have with your health care provider. Document Released: 07/20/2001 Document Revised: 01/01/2016 Document Reviewed: 09/26/2012 Elsevier Interactive Patient Education  2017 Elsevier Inc.  

## 2017-03-24 NOTE — BH Specialist Note (Signed)
Integrated Behavioral Health Follow Up Visit  MRN: [id number] Name: Emily Cain   Session Start time: 8:30 Session End time: 8:35 Total time: 5 MINUTES ONLY Number of Integrated Behavioral Health Clinician visits: 2/10  Type of Service: Integrated Behavioral Health- Individual/Family Interpretor:No. Interpretor Name and Language: n/a   Warm Hand Off Completed.       SUBJECTIVE: Emily RuddKenyadia Briner is a 21 y.o. female accompanied by patient. Patient was referred by Dr Debroah LoopArnold for f/u anxiety. Patient reports the following symptoms/concerns: Pt states she has no concerns today, but will accept brochure about [facility] and Postpartum Planner. Duration of problem: Current pregnancy ; Severity of problem: n/a today  OBJECTIVE: Mood: Appropriate and Affect: Appropriate Risk of harm to self or others: No plan to harm self or others   LIFE CONTEXT: Family and Social: Lives with mother; has two siblings(1brother/cousin, 1sister) School/Work: Studying for [employer] Self-Care: - Life Changes: Current pregnancy   GOALS ADDRESSED: No goals addressed today  INTERVENTIONS: Link to [facility] Standardized Assessments completed: GAD-7 and PHQ 9  ASSESSMENT: Patient currently experiencing History of episode of anxiety. Patient may benefit from community resources for additional social support.  PLAN: 1. Follow up with behavioral health clinician on : As needed 2. Behavioral recommendations:  -Consider [facility] [facility] -Consider reading Postpartum Planner  -Consider continuing with relaxation breathing exercises and calming phone apps, if anxiety returns 3. Referral(s): Integrated Behavioral Health Services (In Clinic) 4. "From scale of 1-10, how likely are you to follow plan?": 9  Rae LipsJamie C Brazil Voytko, LCSWA  Depression screen [facility] 2/9 03/24/2017 01/24/2017 12/13/2016 05/03/2016 04/16/2016  Decreased Interest 0 0 0 0 0  Down, Depressed, Hopeless 0 0 0 0 0  PHQ - 2  Score 0 0 0 0 0  Altered sleeping 0 0 0 0 0  Tired, decreased energy 0 0 0 0 0  Change in appetite 0 0 0 0 0  Feeling bad or failure about yourself  0 0 0 0 0  Trouble concentrating 0 0 0 0 0  Moving slowly or fidgety/restless 0 0 0 0 0  Suicidal thoughts 0 0 0 0 0  PHQ-9 Score 0 0 0 0 0   GAD 7 : Generalized Anxiety Score 03/24/2017 01/24/2017 12/13/2016 05/03/2016  Nervous, Anxious, on Edge 0 0 0 0  Control/stop worrying 0 0 0 0  Worry too much - different things 0 0 0 0  Trouble relaxing 0 0 0 0  Restless 0 0 0 0  Easily annoyed or irritable 0 0 0 0  Afraid - awful might happen 0 0 0 0  Total GAD 7 Score 0 0 0 0  Anxiety Difficulty - - - -

## 2017-03-25 LAB — CBC
Hematocrit: 30 % — ABNORMAL LOW (ref 34.0–46.6)
Hemoglobin: 9.7 g/dL — ABNORMAL LOW (ref 11.1–15.9)
MCH: 25.7 pg — AB (ref 26.6–33.0)
MCHC: 32.3 g/dL (ref 31.5–35.7)
MCV: 79 fL (ref 79–97)
PLATELETS: 148 10*3/uL — AB (ref 150–379)
RBC: 3.78 x10E6/uL (ref 3.77–5.28)
RDW: 16.4 % — ABNORMAL HIGH (ref 12.3–15.4)
WBC: 6.1 10*3/uL (ref 3.4–10.8)

## 2017-03-25 LAB — GLUCOSE TOLERANCE, 2 HOURS W/ 1HR
GLUCOSE, 1 HOUR: 152 mg/dL (ref 65–179)
GLUCOSE, 2 HOUR: 144 mg/dL (ref 65–152)
GLUCOSE, FASTING: 74 mg/dL (ref 65–91)

## 2017-03-25 LAB — RPR: RPR Ser Ql: NONREACTIVE

## 2017-03-25 LAB — HIV ANTIBODY (ROUTINE TESTING W REFLEX): HIV SCREEN 4TH GENERATION: NONREACTIVE

## 2017-03-31 ENCOUNTER — Encounter (HOSPITAL_COMMUNITY): Payer: Self-pay

## 2017-03-31 ENCOUNTER — Ambulatory Visit (HOSPITAL_COMMUNITY)
Admission: RE | Admit: 2017-03-31 | Discharge: 2017-03-31 | Disposition: A | Discharge status: Home / Self Care | Payer: Medicaid Other | Source: Ambulatory Visit | Attending: Obstetrics & Gynecology | Admitting: Obstetrics & Gynecology

## 2017-03-31 DIAGNOSIS — O09293 Supervision of pregnancy with other poor reproductive or obstetric history, third trimester: Secondary | ICD-10-CM | POA: Diagnosis present

## 2017-03-31 DIAGNOSIS — O0990 Supervision of high risk pregnancy, unspecified, unspecified trimester: Secondary | ICD-10-CM

## 2017-03-31 DIAGNOSIS — O364XX0 Maternal care for intrauterine death, not applicable or unspecified: Secondary | ICD-10-CM

## 2017-03-31 DIAGNOSIS — Z3A27 27 weeks gestation of pregnancy: Secondary | ICD-10-CM | POA: Insufficient documentation

## 2017-03-31 DIAGNOSIS — O09893 Supervision of other high risk pregnancies, third trimester: Secondary | ICD-10-CM | POA: Insufficient documentation

## 2017-03-31 DIAGNOSIS — Z8759 Personal history of other complications of pregnancy, childbirth and the puerperium: Secondary | ICD-10-CM | POA: Insufficient documentation

## 2017-04-05 ENCOUNTER — Ambulatory Visit (INDEPENDENT_AMBULATORY_CARE_PROVIDER_SITE_OTHER): Payer: Self-pay | Admitting: Obstetrics and Gynecology

## 2017-04-05 VITALS — BP 110/75 | HR 88 | Wt 182.1 lb

## 2017-04-05 DIAGNOSIS — O99019 Anemia complicating pregnancy, unspecified trimester: Secondary | ICD-10-CM | POA: Insufficient documentation

## 2017-04-05 DIAGNOSIS — Z87440 Personal history of urinary (tract) infections: Secondary | ICD-10-CM

## 2017-04-05 DIAGNOSIS — Z8759 Personal history of other complications of pregnancy, childbirth and the puerperium: Secondary | ICD-10-CM

## 2017-04-05 DIAGNOSIS — O0990 Supervision of high risk pregnancy, unspecified, unspecified trimester: Secondary | ICD-10-CM

## 2017-04-05 DIAGNOSIS — B951 Streptococcus, group B, as the cause of diseases classified elsewhere: Secondary | ICD-10-CM

## 2017-04-05 DIAGNOSIS — D696 Thrombocytopenia, unspecified: Secondary | ICD-10-CM | POA: Insufficient documentation

## 2017-04-05 DIAGNOSIS — O99013 Anemia complicating pregnancy, third trimester: Secondary | ICD-10-CM

## 2017-04-05 DIAGNOSIS — O09893 Supervision of other high risk pregnancies, third trimester: Secondary | ICD-10-CM

## 2017-04-05 DIAGNOSIS — O99113 Other diseases of the blood and blood-forming organs and certain disorders involving the immune mechanism complicating pregnancy, third trimester: Secondary | ICD-10-CM | POA: Insufficient documentation

## 2017-04-05 DIAGNOSIS — O2343 Unspecified infection of urinary tract in pregnancy, third trimester: Secondary | ICD-10-CM

## 2017-04-05 MED ORDER — FERROUS GLUCONATE 324 (38 FE) MG PO TABS: 324.0000 mg | ORAL_TABLET | Freq: Two times a day (BID) | ORAL | 2 refills | Status: DC

## 2017-04-05 MED ORDER — DOCUSATE SODIUM 100 MG PO CAPS: 100.0000 mg | ORAL_CAPSULE | Freq: Two times a day (BID) | ORAL | 3 refills | Status: DC | PRN

## 2017-04-05 MED ORDER — VALACYCLOVIR HCL 1 G PO TABS: 1000.0000 mg | ORAL_TABLET | Freq: Two times a day (BID) | ORAL | 1 refills | Status: DC

## 2017-04-05 NOTE — Addendum Note (Signed)
Addended by: Emmett Bing on: 04/05/2017 06:29 PM   Modules accepted: Orders

## 2017-04-05 NOTE — Progress Notes (Addendum)
Prenatal Visit Note Date: 04/05/2017 Clinic: [facility]  Subjective:  Emily Cain is a 21 y.o. G2P1000 at [redacted]w[redacted]d being seen today for ongoing prenatal care.  She is currently monitored for the following issues for this high-risk pregnancy and has History of IUFD; Supervision of high risk pregnancy, antepartum; GBS (group B streptococcus) UTI complicating pregnancy; Anemia in pregnancy; and Gestational thrombocytopenia without hemorrhage in third trimester [facility]) on her problem list.  Patient reports no complaints.   Contractions: Not present. Vag. Bleeding: None.  Movement: Present. Denies leaking of fluid.   The following portions of the patient's history were reviewed and updated as appropriate: allergies, current medications, past family history, past medical history, past social history, past surgical history and problem list. Problem list updated.  Objective:   Vitals:   04/05/17 1300  BP: 110/75  Pulse: 88  Weight: 182 lb 1.6 oz (82.6 kg)    Fetal Status: Fetal Heart Rate (bpm): 154   Movement: Present     General:  Alert, oriented and cooperative. Patient is in no acute distress.  Skin: Skin is warm and dry. No rash noted.   Cardiovascular: Normal heart rate noted  Respiratory: Normal respiratory effort, no problems with respiration noted  Abdomen: Soft, gravid, appropriate for gestational age. Pain/Pressure: Absent     Pelvic:  Cervical exam deferred        Extremities: Normal range of motion.  Edema: Trace  Mental Status: Normal mood and affect. Normal behavior. Normal judgment and thought content.   Urinalysis:      Assessment and Plan:  Pregnancy: G2P1000 at [redacted]w[redacted]d  1. Anemia during pregnancy in third trimester Start bid iron  2. History of GBS (group b Streptococcus) UTI, currently pregnant, third trimester toc today - Culture, OB Urine  3. Gestational thrombocytopenia without hemorrhage in third trimester (HCC) Repeat in one month  4.  H/o IUFD Normal growth, ac/afi last week. Start ap testing at 32wks. Recommend starting valtrex now.   5. Supervision of high risk pregnancy, antepartum   Preterm labor symptoms and general obstetric precautions including but not limited to vaginal bleeding, contractions, leaking of fluid and fetal movement were reviewed in detail with the patient. Please refer to After Visit Summary for other counseling recommendations.  Return in about 2 weeks (around 04/19/2017) for rob.   [place] Bing, MD

## 2017-04-07 LAB — URINE CULTURE, OB REFLEX

## 2017-04-07 LAB — CULTURE, OB URINE

## 2017-04-12 ENCOUNTER — Telehealth: Payer: Self-pay

## 2017-04-12 NOTE — Telephone Encounter (Signed)
RE: please call her and tell her i recommend starting valtrex 1gm po qday starting now. thanks  Call patient regarding starting valtrex 1 gm po qday no answer I have left a message for patient to call us back regarding medication.

## 2017-04-13 ENCOUNTER — Encounter: Payer: Self-pay | Admitting: Obstetrics & Gynecology

## 2017-04-13 ENCOUNTER — Telehealth: Payer: Self-pay | Admitting: General Practice

## 2017-04-13 MED ORDER — VALACYCLOVIR HCL 1 G PO TABS: 1000.0000 mg | ORAL_TABLET | Freq: Every day | ORAL | 1 refills | Status: DC

## 2017-04-13 NOTE — Telephone Encounter (Signed)
Telephone call to patient regarding mychart message. Told patient none of our providers are available or in the office at this time but I would be happy to answer any questions or pass a message along if I am uncertain. Patient states no that is okay I'll just wait until my appt. Told patient I would be more than happy to help her and find an answer for her if I don't know. Patient states she will just wait until her appt. Patient had no questions

## 2017-04-13 NOTE — Telephone Encounter (Signed)
Patient called and left message stating she is returning our call for results. Per chart review, it was recommended patient begin valtrex 1gram daily. Called patient and informed her of recommendation. Patient verbalized understanding and asked what that medication was for. Told patient it is generally used for herpes but can be used for other things too. Patient asked if she had herpes and I told her she does not. Told patient I am unsure why the medication was recommended but I will call her doctor and call her back when I have more information. Patient verbalized understanding and had no questions at this time.   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 Called patient back and informed her of last years result and recommendation. Patient states she never knew that and has never had vaginal bumps or a cold sore. Told patient I am uncertain when she could've contracted it but the medication is recommended as a precaution. Patient verbalized understanding and had no questions

## 2017-04-19 ENCOUNTER — Encounter: Payer: Self-pay | Admitting: Obstetrics & Gynecology

## 2017-04-20 ENCOUNTER — Emergency Department (HOSPITAL_COMMUNITY)
Admission: EM | Admit: 2017-04-20 | Discharge: 2017-04-20 | Disposition: A | Discharge status: Home / Self Care | Payer: Medicaid Other | Attending: Emergency Medicine | Admitting: Emergency Medicine

## 2017-04-20 ENCOUNTER — Encounter (HOSPITAL_COMMUNITY): Payer: Self-pay | Admitting: Family Medicine

## 2017-04-20 ENCOUNTER — Ambulatory Visit (INDEPENDENT_AMBULATORY_CARE_PROVIDER_SITE_OTHER): Payer: Self-pay | Admitting: Obstetrics and Gynecology

## 2017-04-20 VITALS — BP 128/78 | HR 93 | Wt 186.2 lb

## 2017-04-20 DIAGNOSIS — O0993 Supervision of high risk pregnancy, unspecified, third trimester: Secondary | ICD-10-CM

## 2017-04-20 DIAGNOSIS — Z8759 Personal history of other complications of pregnancy, childbirth and the puerperium: Secondary | ICD-10-CM

## 2017-04-20 DIAGNOSIS — H10021 Other mucopurulent conjunctivitis, right eye: Secondary | ICD-10-CM | POA: Diagnosis not present

## 2017-04-20 DIAGNOSIS — H538 Other visual disturbances: Secondary | ICD-10-CM | POA: Diagnosis present

## 2017-04-20 DIAGNOSIS — D696 Thrombocytopenia, unspecified: Secondary | ICD-10-CM

## 2017-04-20 DIAGNOSIS — O26893 Other specified pregnancy related conditions, third trimester: Secondary | ICD-10-CM | POA: Diagnosis not present

## 2017-04-20 DIAGNOSIS — R894 Abnormal immunological findings in specimens from other organs, systems and tissues: Secondary | ICD-10-CM

## 2017-04-20 DIAGNOSIS — Z7982 Long term (current) use of aspirin: Secondary | ICD-10-CM | POA: Diagnosis not present

## 2017-04-20 DIAGNOSIS — O99013 Anemia complicating pregnancy, third trimester: Secondary | ICD-10-CM

## 2017-04-20 DIAGNOSIS — O99113 Other diseases of the blood and blood-forming organs and certain disorders involving the immune mechanism complicating pregnancy, third trimester: Secondary | ICD-10-CM

## 2017-04-20 DIAGNOSIS — Z3A30 30 weeks gestation of pregnancy: Secondary | ICD-10-CM | POA: Diagnosis not present

## 2017-04-20 DIAGNOSIS — Z79899 Other long term (current) drug therapy: Secondary | ICD-10-CM | POA: Diagnosis not present

## 2017-04-20 DIAGNOSIS — B951 Streptococcus, group B, as the cause of diseases classified elsewhere: Secondary | ICD-10-CM

## 2017-04-20 DIAGNOSIS — H109 Unspecified conjunctivitis: Secondary | ICD-10-CM

## 2017-04-20 DIAGNOSIS — O0990 Supervision of high risk pregnancy, unspecified, unspecified trimester: Secondary | ICD-10-CM

## 2017-04-20 DIAGNOSIS — O2343 Unspecified infection of urinary tract in pregnancy, third trimester: Secondary | ICD-10-CM

## 2017-04-20 LAB — POCT URINALYSIS DIP (DEVICE)
BILIRUBIN URINE: NEGATIVE
GLUCOSE, UA: NEGATIVE mg/dL
Hgb urine dipstick: NEGATIVE
KETONES UR: NEGATIVE mg/dL
Nitrite: NEGATIVE
Protein, ur: 30 mg/dL — AB
SPECIFIC GRAVITY, URINE: 1.025 (ref 1.005–1.030)
Urobilinogen, UA: 1 mg/dL (ref 0.0–1.0)
pH: 7 (ref 5.0–8.0)

## 2017-04-20 MED ORDER — ERYTHROMYCIN 5 MG/GM OP OINT
TOPICAL_OINTMENT | Freq: Once | OPHTHALMIC | Status: AC
  Administered 2017-04-20: 1 via OPHTHALMIC
  Filled 2017-04-20: qty 3.5

## 2017-04-20 MED ORDER — FLUORESCEIN SODIUM 0.6 MG OP STRP
1.0000 | ORAL_STRIP | Freq: Once | OPHTHALMIC | Status: AC
  Administered 2017-04-20: 1 via OPHTHALMIC

## 2017-04-20 MED ORDER — TETRACAINE HCL 0.5 % OP SOLN
1.0000 [drp] | Freq: Once | OPHTHALMIC | Status: AC
  Administered 2017-04-20: 1 [drp] via OPHTHALMIC
  Filled 2017-04-20: qty 4

## 2017-04-20 NOTE — Progress Notes (Signed)
Patient c/o blurred vision in right eye which started yesterday, has not seen a provider about it yet Would like to be tested for HSV before agreeing to take Valtrex

## 2017-04-20 NOTE — Patient Instructions (Signed)
Please go to the eye doctor to be evaluated for blurry vision  Visual Disturbances A visual disturbance is any problem that interferes with your normal vision. You can have a visual disturbance in one eye or both eyes. Some types of visual disturbances come and go without treatment and do not cause a permanent problem. Other visual disturbances may be a sign of a serious medical emergency. There are many signs and symptoms of a visual disturbance, including:  Blurred vision.  Inability to see certain colors.  Seeing floating spots (floaters).  Light sensitivity.  Flashing or shimmering lights.  Zigzagging lines or stars.  Seeing the floor as tilted (visual midline shift).  Being unaware of objects on one side of the body (visual spatial inattention).  Double vision.  Rhythmic, involuntary eye movements (nystagmus).  Hallucinations.  Temporary or permanent blindness.  Follow these instructions at home:  Take over-the-counter and prescription medicines only as told by your health care provider.  To lower your risk of the problems that can lead to visual disturbances: ? Eat a healthy diet. ? Maintain a healthy weight. Lose weight if you need to. ? Exercise regularly. Ask your health care provider what activities are safe for you.  Avoid migraine triggers when possible.  Keep all follow-up visits as told by your health care provider. This is important. Contact a health care provider if:  Your visual disturbance changes or becomes worse.  You notice any new visual disturbances. Get help right away if:  You lose most or all of your vision in one eye or both eyes.  You experience visual hallucinations.  You have chest pain, nausea, or vomiting along with visual disturbances. This information is not intended to replace advice given to you by your health care provider. Make sure you discuss any questions you have with your health care provider. Document Released:  09/02/2004 Document Revised: 01/07/2016 Document Reviewed: 01/02/2014 Elsevier Interactive Patient Education  [employer]2018 Elsevier Inc.

## 2017-04-20 NOTE — Discharge Instructions (Signed)
Call Dr. Randon GoldsmithLyles' office today to schedule follow up for your eye.  Use the ointment 3 times a day.

## 2017-04-20 NOTE — Progress Notes (Signed)
Subjective:  Emily Cain is a 21 y.o. G2P1000 at 2261w3d being seen today for ongoing prenatal care.  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  Patient reports blurry vision. Blurry vision only in right eye. Started yesterday. Can still read but it is blurry. No scotomas. Has never been seen by eye doctor. Contractions: Irregular. Vag. Bleeding: None.  Movement: Present. Denies leaking of fluid.   Also of note patient requesting that she be retested for HSV. She states that she believes it may be a false result because she has never had an outbreak. She was not on medication with her last pregnancy and also she had a swab that was negative for HSV. She ahs not started Valtrex as recommended.   The following portions of the patient's history were reviewed and updated as appropriate: allergies, current medications, past family history, past medical history, past social history, past surgical history and problem list. Problem list updated.  Objective:   Vitals:   04/20/17 0810  BP: 128/78  Pulse: 93  Weight: 84.5 kg (186 lb 3.2 oz)    Fetal Status: Fetal Heart Rate (bpm): 150 Fundal Height: 33 cm Movement: Present     General:  Alert, oriented and cooperative. Patient is in no acute distress.  Skin: Skin is warm and dry. No rash noted.   Cardiovascular: Normal heart rate noted  Respiratory: Normal respiratory effort, no problems with respiration noted  Abdomen: Soft, gravid, appropriate for gestational age. Pain/Pressure: Present     Pelvic: Vag. Bleeding: None Vag D/C Character: White   Cervical exam performed Dilation: Closed Effacement (%): Thick  Station: Ballotable  Extremities: Normal range of motion.  Edema: Trace  Mental Status: Normal mood and affect. Normal behavior. Normal judgment and thought content.   Urinalysis: Urine Protein: 1+ Urine Glucose: Negative  Assessment and Plan:  Pregnancy: G2P1000 at 6461w3d  1. Supervision of high risk pregnancy, antepartum Continue routine prenatal care. Declined flu today.   2. History of IUFD Will begin antepartum testing at next visit at 32wks. Has scheduled qmonth growth US.   3. Anemia during pregnancy in third trimester On 28wk labs Hbg 9.7. Patient placed on iron; continue use. Recheck Hbg at next visit.   4. Gestational thrombocytopenia without hemorrhage in third trimester (HCC) Plts 148 on recent labs. No signs of easy bruising or bleeding. Monitor. Repeat at next visit.   5. Group B Streptococcus urinary tract infection affecting pregnancy in third trimester Antibiotics intrapartum.   6. 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 Encouraged her to start Valtrex. Reviewed the risk of not starting. Reports never having an outbreak.  - Herpes simplex virus(hsv) dna by pcr  7. Blurry vision Visual acuity intact. Encouraged patient to follow-up with eye doctor for evaluation. Will recheck pre-E labs today. Had 1+ protein on UA. Denies HA, swelling, RUQ abdominal pain. BPs wnl.  - Protein / creatinine ratio, urine - Comprehensive metabolic panel   Preterm labor symptoms and general obstetric precautions including but not limited to vaginal bleeding, contractions, leaking of fluid and fetal movement were reviewed in detail with the patient. Please refer to After Visit Summary for other counseling recommendations.  Return in about 2  weeks (around 05/04/2017) for ob visit.   Caryl Ada, DO OB Fellow 04/20/2017, 9:49 AM

## 2017-04-20 NOTE — ED Triage Notes (Signed)
Patient reports she is experiencing right eye blurry vision that started yesterday. Denies headache, lightheadedness or change in left eye. Patient reports [facility] and was referred to be evaluated at another facility.

## 2017-04-20 NOTE — ED Provider Notes (Signed)
[facility]-EMERGENCY DEPT Provider Note   CSN: [id number] Arrival date & time: 04/20/17  1147     History   Chief Complaint Chief Complaint  Patient presents with  . Blurred Vision    HPI Emily Cain is a 21 y.o.  G2P1000  gestation who presents to the ED with blurry vision in the right eye. The symptoms started today. Patient reports that she is not sure if she rubbed her eye or not. Patient denies headache or blood pressure problems. Patient had her regular prenatal visit today and had vision and BP checked and the doctor recommended that the patient have a f/u appointment with opthalmology. There was no concern for PIH.  The patient left the OB visit and came to the ED.   HPI  Past Medical History:  Diagnosis Date  . Anemia   . Environmental allergies   . Medical history non-contributory     Patient Active Problem List   Diagnosis Date Noted  . 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 Anemia in pregnancy 04/05/2017  . Gestational thrombocytopenia without hemorrhage in third trimester (HCC) 04/05/2017  . GBS (group B streptococcus) UTI complicating pregnancy 12/20/2016  . Supervision of high risk pregnancy, antepartum 12/13/2016  . 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 NO PAST SURGERIES      OB History    Gravida Para Term Preterm AB Living   0 0 0   SAB TAB Ectopic Multiple Live Births   0 0 0 0         Home Medications    Prior to Admission medications   Medication Sig Start Date End Date Taking? Authorizing Provider  aspirin EC 81 MG tablet Take 1 tablet (81 mg total) by mouth daily. 03/03/17   Eulis Foster, MD  Butalbital-APAP-Caffeine [id number] MG capsule Take 1-2 capsules by mouth every 6 (six) hours as needed for headache. Patient not taking: Reported on 03/31/2017 03/23/17   Constant, Peggy, MD  docusate sodium (COLACE) 100 MG capsule Take 1 capsule (100 mg total) by  mouth 2 (two) times daily as needed for mild constipation. Patient not taking: Reported on 04/20/2017 04/05/17   [place] Bing, MD  ferrous gluconate (FERGON) 324 MG tablet Take 1 tablet (324 mg total) by mouth 2 (two) times daily with a meal. 04/05/17   [place] Bing, MD  hydrocortisone (ANUSOL-HC) 2.5 % rectal cream Place 1 application rectally 2 (two) times daily. Patient not taking: Reported on 03/24/2017 03/03/17   [place] Bing, MD  pantoprazole (PROTONIX) 20 MG tablet Take 1 tablet (20 mg total) by mouth daily. Patient not taking: Reported on 03/24/2017 01/24/17   Adam Phenix, MD  Prenatal Vit-Fe Fumarate-FA (PRENATAL VITAMIN PO) Take by mouth.    [provider]  valACYclovir (VALTREX) 1000 MG tablet Take 1 tablet (1,000 mg total) by mouth daily. Patient not taking: Reported on 04/20/2017 04/13/17   [place] Bing, MD    Family History Family History  Problem Relation Age of Onset  . Cancer Maternal Aunt   . Cancer Maternal Uncle   . Cancer Maternal Grandmother     Social History Social History  Substance Use Topics  . Smoking status: Never Smoker  . Smokeless tobacco: Never Used  . Alcohol use No     Allergies   Patient has no known allergies.   Review of Systems Review of Systems  HENT: Negative.   Eyes: Positive  for discharge, redness and itching.       Right eye  Gastrointestinal: Negative for abdominal pain, nausea and vomiting.       Gravid @ 6240w3d   Musculoskeletal: Negative for neck pain.  Skin: Negative for rash.  Neurological: Negative for headaches.     Physical Exam Updated Vital Signs BP 121/73 (BP Location: Right Arm)   Pulse 98   Temp 98.3 F (36.8 C) (Oral)   Resp 20   Ht 5' 7.5" (1.715 m)   Wt 84.4 kg (186 lb)   LMP 10/08/2016 (LMP Unknown)   SpO2 100%   BMI 28.70 kg/m   Physical Exam  Constitutional: She appears well-developed and well-nourished. No distress.  HENT:  Head: Normocephalic.  Eyes: Pupils are equal,  round, and reactive to light. EOM are normal. Lids are everted and swept, no foreign bodies found. Right eye exhibits discharge. Right eye exhibits no hordeolum. No foreign body present in the right eye. Right conjunctiva is injected.  Fundoscopic exam:      The right eye shows exudate.  Slit lamp exam:      The right eye shows no corneal abrasion and no fluorescein uptake.  Neck: Neck supple.  Cardiovascular: Normal rate.   Pulmonary/Chest: Effort normal.  Abdominal:  Gravid c/w dates  Musculoskeletal: Normal range of motion.  Neurological: She is alert.  Skin: Skin is warm and dry.  Psychiatric: She has a normal mood and affect.  Nursing note and vitals reviewed.    ED Treatments / Results  Labs (all labs ordered are listed, but only abnormal results are displayed) Labs Reviewed - No data to display  Radiology No results found.  Procedures Procedures (including critical care time)  Medications Ordered in ED Medications  tetracaine (PONTOCAINE) 0.5 % ophthalmic solution 1 drop (1 drop Right Eye Given 04/20/17 1315)  fluorescein ophthalmic strip 1 strip (1 strip Right Eye Given by Other 04/20/17 1315)  erythromycin ophthalmic ointment (1 application Right Eye Given 04/20/17 1414)     Initial Impression / Assessment and Plan / ED Course  I have reviewed the triage vital signs and the nursing notes.  21 y.o. female with c/o blurry vision right eye, mild redness and itching stable for d/c to f/u with opthalmology as suggested by her OB earlier today. Patient started on erythromycin opth ointment and referral given.   Final Clinical Impressions(s) / ED Diagnoses   Final diagnoses:  Bacterial conjunctivitis of right eye    New Prescriptions Discharge Medication List as of 04/20/2017  2:07 PM       Janne Napoleoneese, Darrelle Barrell M, NP 04/20/17 1741    Little, Ambrose Finlandachel Morgan, MD 04/20/17 2203

## 2017-04-22 LAB — COMPREHENSIVE METABOLIC PANEL
A/G RATIO: 1.3 (ref 1.2–2.2)
ALT: 7 IU/L (ref 0–32)
AST: 12 IU/L (ref 0–40)
Albumin: 3.6 g/dL (ref 3.5–5.5)
Alkaline Phosphatase: 65 IU/L (ref 39–117)
BUN/Creatinine Ratio: 13 (ref 9–23)
BUN: 7 mg/dL (ref 6–20)
CALCIUM: 8.9 mg/dL (ref 8.7–10.2)
CHLORIDE: 104 mmol/L (ref 96–106)
CO2: 21 mmol/L (ref 20–29)
Creatinine, Ser: 0.54 mg/dL — ABNORMAL LOW (ref 0.57–1.00)
GFR, EST AFRICAN AMERICAN: 158 mL/min/{1.73_m2} (ref >59)
GFR, EST NON AFRICAN AMERICAN: 137 mL/min/{1.73_m2} (ref >59)
Globulin, Total: 2.8 g/dL (ref 1.5–4.5)
Glucose: 84 mg/dL (ref 65–99)
POTASSIUM: 4.2 mmol/L (ref 3.5–5.2)
SODIUM: 140 mmol/L (ref 134–144)
TOTAL PROTEIN: 6.4 g/dL (ref 6.0–8.5)

## 2017-04-22 LAB — HERPES SIMPLEX VIRUS(HSV) DNA BY PCR
HSV 2 DNA: NEGATIVE
HSV-1 DNA: NEGATIVE

## 2017-04-22 LAB — PROTEIN / CREATININE RATIO, URINE
Creatinine, Urine: 238.9 mg/dL
PROTEIN UR: 23 mg/dL
PROTEIN/CREAT RATIO: 96 mg/g{creat} (ref 0–200)

## 2017-04-26 ENCOUNTER — Encounter: Payer: Self-pay | Admitting: Obstetrics and Gynecology

## 2017-04-28 ENCOUNTER — Ambulatory Visit (HOSPITAL_COMMUNITY)
Admission: RE | Admit: 2017-04-28 | Discharge: 2017-04-28 | Disposition: A | Discharge status: Home / Self Care | Payer: Medicaid Other | Source: Ambulatory Visit | Attending: Obstetrics & Gynecology | Admitting: Obstetrics & Gynecology

## 2017-04-28 ENCOUNTER — Encounter: Payer: Self-pay | Admitting: *Deleted

## 2017-04-28 ENCOUNTER — Other Ambulatory Visit (HOSPITAL_COMMUNITY): Payer: Self-pay | Admitting: Obstetrics and Gynecology

## 2017-04-28 ENCOUNTER — Encounter (HOSPITAL_COMMUNITY): Payer: Self-pay

## 2017-04-28 DIAGNOSIS — Z3A31 31 weeks gestation of pregnancy: Secondary | ICD-10-CM

## 2017-04-28 DIAGNOSIS — O0990 Supervision of high risk pregnancy, unspecified, unspecified trimester: Secondary | ICD-10-CM

## 2017-04-28 DIAGNOSIS — O364XX0 Maternal care for intrauterine death, not applicable or unspecified: Secondary | ICD-10-CM

## 2017-04-28 DIAGNOSIS — O09893 Supervision of other high risk pregnancies, third trimester: Secondary | ICD-10-CM

## 2017-04-28 DIAGNOSIS — O09293 Supervision of pregnancy with other poor reproductive or obstetric history, third trimester: Secondary | ICD-10-CM | POA: Insufficient documentation

## 2017-04-28 NOTE — Progress Notes (Signed)
Pt presented to office with her mother and stated she had some questions regarding lab test results from visit on 9/12. She stated concern regarding her urine test which showed the presence of some protein and also that she has had some headaches and blurry vision in her Rt eye. I explained that it is normal to have some protein in urine during pregnancy. On that same day additional tests were performed to check for pre-eclampsia. Pt was informed that all results from those tests were normal and indicated she does not have pre-eclampsia. No additional labs are needed at this time. I advised that we will continue to monitor her urine for significant changes. Also she should come to hospital immediately for evaluation if she experiences a severe headache or visual disturbances (blurry, seeing spots or dizziness) which do not go away within a few minutes. Pt was also informed of negative HSV test. It is not necessary for her to take Valtrex as previously prescribed however she may choose to do so if desired per Dr. Doroteo Glassman. Pt and her mother expressed extreme relief regarding test results and gratitude for the time spent discussing the information. They did not have any further questions.

## 2017-05-04 ENCOUNTER — Ambulatory Visit: Payer: Self-pay

## 2017-05-04 ENCOUNTER — Ambulatory Visit (INDEPENDENT_AMBULATORY_CARE_PROVIDER_SITE_OTHER): Payer: Medicaid Other | Admitting: Obstetrics & Gynecology

## 2017-05-04 VITALS — BP 111/66 | HR 97 | Wt 189.0 lb

## 2017-05-04 DIAGNOSIS — O99113 Other diseases of the blood and blood-forming organs and certain disorders involving the immune mechanism complicating pregnancy, third trimester: Secondary | ICD-10-CM

## 2017-05-04 DIAGNOSIS — Z8759 Personal history of other complications of pregnancy, childbirth and the puerperium: Secondary | ICD-10-CM

## 2017-05-04 DIAGNOSIS — D696 Thrombocytopenia, unspecified: Secondary | ICD-10-CM

## 2017-05-04 DIAGNOSIS — O0990 Supervision of high risk pregnancy, unspecified, unspecified trimester: Secondary | ICD-10-CM

## 2017-05-04 LAB — POCT URINALYSIS DIP (DEVICE)
Bilirubin Urine: NEGATIVE
Glucose, UA: NEGATIVE mg/dL
HGB URINE DIPSTICK: NEGATIVE
Ketones, ur: NEGATIVE mg/dL
NITRITE: NEGATIVE
PH: 7 (ref 5.0–8.0)
Protein, ur: NEGATIVE mg/dL
Specific Gravity, Urine: 1.02 (ref 1.005–1.030)
Urobilinogen, UA: 0.2 mg/dL (ref 0.0–1.0)

## 2017-05-04 NOTE — Progress Notes (Signed)
   PRENATAL VISIT NOTE  Subjective:  Emily Cain is a 21 y.o. G2P1000 at [redacted]w[redacted]d being seen today for ongoing prenatal care.  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  Patient reports no complaints.  Contractions: Irritability. Vag. Bleeding: None.  Movement: Present. Denies leaking of fluid.   The following portions of the patient's history were reviewed and updated as appropriate: allergies, current medications, past family history, past medical history, past social history, past surgical history and problem list. Problem list updated.  Objective:   Vitals:   05/04/17 1422  BP: 111/66  Pulse: 97  Weight: 189 lb (85.7 kg)    Fetal Status:     Movement: Present  Presentation: Vertex  General:  Alert, oriented and cooperative. Patient is in no acute distress.  Skin: Skin is warm and dry. No rash noted.   Cardiovascular: Normal heart rate noted  Respiratory: Normal respiratory effort, no problems with respiration noted  Abdomen: Soft, gravid, appropriate for gestational age.  Pain/Pressure: Present     Pelvic: Cervical exam deferred        Extremities: Normal range of motion.  Edema: None  Mental Status:  Normal mood and affect. Normal behavior. Normal judgment and thought content.   Assessment and Plan:  Pregnancy: G2P1000 at [redacted]w[redacted]d  1. History of IUFD BPP 8/10, normal AFV. Growth scan scheduled 05/26/17.  IOL at 39 weeks or earlier. - Fetal nonstress test - US FETAL BPP WO NON STRESS; Future  2. Gestational thrombocytopenia without hemorrhage in third trimester (HCC) - CBC checked today.  3. Supervision of high risk pregnancy,  antepartum Preterm labor symptoms and general obstetric precautions including but not limited to vaginal bleeding, contractions, leaking of fluid and fetal movement were reviewed in detail with the patient. Please refer to After Visit Summary for other counseling recommendations.  Return in about 1 week (around 05/11/2017) for Weekly NST, BPP, OB visit.   Jaynie Collins, MD

## 2017-05-04 NOTE — Patient Instructions (Addendum)
Return to clinic for any scheduled appointments or obstetric concerns, or go to [facility] for evaluation   AREA PEDIATRIC/FAMILY PRACTICE PHYSICIANS  [facility] [address], [address] [place], [place]  [address] Phone - [phone]   Fax - [phone]  [facility] [address] [place], [place] [address] Phone - [phone]   Fax - [phone]  [facility] [address] [place], [place]  [address] Phone - [phone]   Fax - [phone]  [facility] [address] [place], [place]  [address] Phone - [phone]   Fax - [phone]  [facility] [address] [place], [place]  [address] Phone - [phone]   Fax - [phone]  [facility] [address], [address] [place], [place]  [address] Phone - [phone]   Fax - [phone]  [facility] [address] [place], [place]  [address] Phone - [phone]   Fax - [phone]  [facility] [address], [address] [place], [place]  [address] Phone - [phone]   Fax - [phone]  [facility] [address] [place], [place]  [address] Phone - [phone]   Fax - [phone] [facility] [address] [place], [place]  [address] Phone - [phone]   Fax - [phone]  [facility] [address] [place], [place]  [address] Phone - [phone]   Fax - [phone]  [facility] [address] [place], [place]  [address] Phone - [phone]   Fax - [phone]  [facility] [address], [address] [place], [place]  [address] Phone - [phone]   Fax - [phone]  [facility] [address] [place], [place]  [address] Phone - [phone]  [facility] [address] [place], [place]  [address] Phone - [phone]   Fax - [phone]  [facility] [address] [place], [place]  [address] Phone - [phone]   Fax - [phone]  [facility] [address] [place], [place]  [address] Phone - [phone]   Fax - [phone]  [facility] [address] [place], [place]  [address] Phone - [phone]   Fax - [phone]   [facility] [address] [place], [place]  [address] Phone - [phone]   Fax - [phone]  [facility] [address] [place], [place]  [address] Phone - [phone]   Fax - [phone]  [facility] [address] , [place]  [address] Phone - [phone]   Fax - [phone]  [facility] [address] , [place]  [address] Phone - [phone]   Fax - [phone]  [facility] [address] , [place]  [address] Phone - [phone]   Fax - [phone] [facility] [address] [place], [place]  [address] Phone - [phone]   Fax - [phone]  [facility] [address] [place], [place]  [address] Phone - [phone]   Fax - [phone]   [facility] [address] [place], [place]  [address] Phone - [phone]   Fax - [phone]  [facility] Charlene Flemming MD [address] [place]  [address] Phone [phone]    Fax [phone]   

## 2017-05-05 LAB — CBC
Hematocrit: 30.6 % — ABNORMAL LOW (ref 34.0–46.6)
Hemoglobin: 10.3 g/dL — ABNORMAL LOW (ref 11.1–15.9)
MCH: 27 pg (ref 26.6–33.0)
MCHC: 33.7 g/dL (ref 31.5–35.7)
MCV: 80 fL (ref 79–97)
Platelets: 132 10*3/uL — ABNORMAL LOW (ref 150–379)
RBC: 3.81 x10E6/uL (ref 3.77–5.28)
RDW: 19 % — AB (ref 12.3–15.4)
WBC: 5.2 10*3/uL (ref 3.4–10.8)

## 2017-05-11 ENCOUNTER — Ambulatory Visit: Payer: Self-pay

## 2017-05-11 ENCOUNTER — Ambulatory Visit (INDEPENDENT_AMBULATORY_CARE_PROVIDER_SITE_OTHER): Payer: Medicaid Other | Admitting: *Deleted

## 2017-05-11 ENCOUNTER — Ambulatory Visit (INDEPENDENT_AMBULATORY_CARE_PROVIDER_SITE_OTHER): Payer: Self-pay | Admitting: Family Medicine

## 2017-05-11 VITALS — BP 120/80 | HR 91 | Wt 190.0 lb

## 2017-05-11 DIAGNOSIS — O9989 Other specified diseases and conditions complicating pregnancy, childbirth and the puerperium: Secondary | ICD-10-CM

## 2017-05-11 DIAGNOSIS — Z8759 Personal history of other complications of pregnancy, childbirth and the puerperium: Secondary | ICD-10-CM

## 2017-05-11 DIAGNOSIS — O0990 Supervision of high risk pregnancy, unspecified, unspecified trimester: Secondary | ICD-10-CM

## 2017-05-11 DIAGNOSIS — O0993 Supervision of high risk pregnancy, unspecified, third trimester: Secondary | ICD-10-CM

## 2017-05-11 LAB — POCT URINALYSIS DIP (DEVICE)
GLUCOSE, UA: NEGATIVE mg/dL
Ketones, ur: NEGATIVE mg/dL
NITRITE: NEGATIVE
Protein, ur: 30 mg/dL — AB
UROBILINOGEN UA: 1 mg/dL (ref 0.0–1.0)
pH: 6 (ref 5.0–8.0)

## 2017-05-11 NOTE — Progress Notes (Signed)

## 2017-05-11 NOTE — Progress Notes (Signed)
   PRENATAL VISIT NOTE  Subjective:  Emily Cain is a 21 y.o. G2P1000 at [redacted]w[redacted]d being seen today for ongoing prenatal care.  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  Patient reports no complaints.  Contractions: Irregular.  .  Movement: Present. Denies leaking of fluid.    The following portions of the patient's history were reviewed and updated as appropriate: allergies, current medications, past family history, past medical history, past social history, past surgical history and problem list. Problem list updated.  Objective:  BP 120/80, pulse 91, weight 190 lb, FHT 154 bpm, fundal height 33 cm  Fetal Status:     Movement: Present     General:  Alert, oriented and cooperative. Patient is in no acute distress.  Skin: Skin is warm and dry. No rash noted.   Cardiovascular: Normal heart rate noted  Respiratory: Normal respiratory effort, no problems with respiration noted  Abdomen: Soft, gravid, appropriate for gestational age.  Pain/Pressure: Present     Pelvic: Cervical exam deferred        Extremities: Normal range of motion.  Edema: Trace  Mental Status:  Normal mood and affect. Normal behavior. Normal judgment and thought content.   Assessment and Plan:  Pregnancy: G2P1000 at [redacted]w[redacted]d Routine care.  Preterm labor symptoms and general obstetric precautions including but not limited to vaginal bleeding, contractions, leaking of fluid and fetal movement were reviewed in detail with the patient. Please refer to After Visit Summary for other counseling recommendations.  Return in about 1 week (around 05/18/2017) for 10/10 or 10/11  NST/BPP only;  10/18  NST & HOB - has [place] @ 1530.   Rolm Bookbinder, DO

## 2017-05-11 NOTE — Patient Instructions (Signed)

## 2017-05-18 ENCOUNTER — Ambulatory Visit (INDEPENDENT_AMBULATORY_CARE_PROVIDER_SITE_OTHER): Payer: Medicaid Other | Admitting: *Deleted

## 2017-05-18 ENCOUNTER — Ambulatory Visit: Payer: Self-pay

## 2017-05-18 VITALS — BP 118/66 | HR 90

## 2017-05-18 DIAGNOSIS — Z8759 Personal history of other complications of pregnancy, childbirth and the puerperium: Secondary | ICD-10-CM

## 2017-05-18 NOTE — Progress Notes (Signed)

## 2017-05-18 NOTE — Progress Notes (Signed)
BPP performed today was reviewed and was found to be 10/10.  AFI was also normal at 15.7 cm.  Continue recommended antenatal testing and prenatal care.

## 2017-05-25 ENCOUNTER — Encounter (HOSPITAL_COMMUNITY): Payer: Self-pay

## 2017-05-25 ENCOUNTER — Inpatient Hospital Stay (HOSPITAL_COMMUNITY)
Admission: AD | Admit: 2017-05-25 | Discharge: 2017-05-26 | Disposition: A | Discharge status: Home / Self Care | Payer: Medicaid Other | Source: Ambulatory Visit | Attending: Obstetrics and Gynecology | Admitting: Obstetrics and Gynecology

## 2017-05-25 ENCOUNTER — Encounter (HOSPITAL_COMMUNITY): Payer: Self-pay | Admitting: *Deleted

## 2017-05-25 ENCOUNTER — Inpatient Hospital Stay (HOSPITAL_COMMUNITY): Payer: Medicaid Other

## 2017-05-25 DIAGNOSIS — Z3689 Encounter for other specified antenatal screening: Secondary | ICD-10-CM

## 2017-05-25 DIAGNOSIS — Z7982 Long term (current) use of aspirin: Secondary | ICD-10-CM | POA: Insufficient documentation

## 2017-05-25 DIAGNOSIS — O9989 Other specified diseases and conditions complicating pregnancy, childbirth and the puerperium: Secondary | ICD-10-CM | POA: Diagnosis not present

## 2017-05-25 DIAGNOSIS — O368130 Decreased fetal movements, third trimester, not applicable or unspecified: Secondary | ICD-10-CM | POA: Diagnosis present

## 2017-05-25 DIAGNOSIS — B373 Candidiasis of vulva and vagina: Secondary | ICD-10-CM | POA: Insufficient documentation

## 2017-05-25 DIAGNOSIS — Z3A35 35 weeks gestation of pregnancy: Secondary | ICD-10-CM | POA: Diagnosis not present

## 2017-05-25 DIAGNOSIS — R110 Nausea: Secondary | ICD-10-CM

## 2017-05-25 DIAGNOSIS — Z8759 Personal history of other complications of pregnancy, childbirth and the puerperium: Secondary | ICD-10-CM

## 2017-05-25 DIAGNOSIS — O98813 Other maternal infectious and parasitic diseases complicating pregnancy, third trimester: Secondary | ICD-10-CM | POA: Insufficient documentation

## 2017-05-25 DIAGNOSIS — O26899 Other specified pregnancy related conditions, unspecified trimester: Secondary | ICD-10-CM

## 2017-05-25 DIAGNOSIS — B3731 Acute candidiasis of vulva and vagina: Secondary | ICD-10-CM

## 2017-05-25 HISTORY — DX: Personal history of other complications of pregnancy, childbirth and the puerperium: Z87.59

## 2017-05-25 LAB — WET PREP, GENITAL
Clue Cells Wet Prep HPF POC: NONE SEEN
SPERM: NONE SEEN
TRICH WET PREP: NONE SEEN

## 2017-05-25 LAB — URINALYSIS, ROUTINE W REFLEX MICROSCOPIC
BILIRUBIN URINE: NEGATIVE
Bacteria, UA: NONE SEEN
GLUCOSE, UA: NEGATIVE mg/dL
Hgb urine dipstick: NEGATIVE
KETONES UR: 5 mg/dL — AB
NITRITE: NEGATIVE
PH: 6 (ref 5.0–8.0)
Protein, ur: 30 mg/dL — AB
SPECIFIC GRAVITY, URINE: 1.032 — AB (ref 1.005–1.030)

## 2017-05-25 MED ORDER — FLUCONAZOLE 150 MG PO TABS
150.0000 mg | ORAL_TABLET | Freq: Once | ORAL | Status: AC
  Administered 2017-05-26: 150 mg via ORAL
  Filled 2017-05-25: qty 1

## 2017-05-25 MED ORDER — ONDANSETRON 8 MG PO TBDP
8.0000 mg | ORAL_TABLET | Freq: Once | ORAL | Status: AC
  Administered 2017-05-25: 8 mg via ORAL
  Filled 2017-05-25: qty 1

## 2017-05-25 NOTE — MAU Provider Note (Signed)
History     CSN: [id number]  Arrival date and time: 05/25/17 2216   First Provider Initiated Contact with Patient 05/25/17 2254      Chief Complaint  Patient presents with  . Vaginal Discharge  . Decreased Fetal Movement  . Nausea   HPI Emily Cain is a 21 y.o. G2P1000 at [redacted]w[redacted]d who presents with vaginal discharge, nausea, & decreased fetal movement. Was diagnosed with yeast infection in August; was taking monistat without relief. States thick white vaginal discharge & vaginal itching has continued since then. Nausea since this morning; feels like she's dehydrated & has dry lips. Has not vomited & reports drinking 5 bottles of water today. Denies diarrhea, vaginal bleeding, LOF, or abdominal pain. States fetal movement has changed over the last 2 days. Movement is not as strong as previously & not moving as much.  OB History    Gravida Para Term Preterm AB Living   2 1 1  0 0 0   SAB TAB Ectopic Multiple Live Births   0 0 0 0        Past Medical History:  Diagnosis Date  . Anemia   . Environmental allergies   . History of IUFD     Past Surgical History:  Procedure Laterality Date  . NO PAST SURGERIES      Family History  Problem Relation Age of Onset  . Cancer Maternal Aunt   . Cancer Maternal Uncle   . Cancer Maternal Grandmother     Social History  Substance Use Topics  . Smoking status: Never Smoker  . Smokeless tobacco: Never Used  . Alcohol use No    Allergies: No Known Allergies  Prescriptions Prior to Admission  Medication Sig Dispense Refill Last Dose  . aspirin EC 81 MG tablet Take 1 tablet (81 mg total) by mouth daily. 30 tablet 3 Taking  . Prenatal Vit-Fe Fumarate-FA (PRENATAL VITAMIN PO) Take by mouth.   Taking    Review of Systems  Constitutional: Negative.   Gastrointestinal: Positive for nausea. Negative for abdominal pain, constipation, diarrhea and vomiting.  Genitourinary: Positive for vaginal discharge. Negative for dysuria and  vaginal bleeding.  Neurological: Positive for dizziness. Negative for headaches.   Physical Exam   Blood pressure 120/81, pulse 85, temperature 98.2 F (36.8 C), temperature source Oral, resp. rate 19, height 5\' 7"  (1.702 m), weight 193 lb (87.5 kg), last menstrual period 10/08/2016, SpO2 98 %.  Physical Exam  Nursing note and vitals reviewed. Constitutional: She is oriented to person, place, and time. She appears well-developed and well-nourished. No distress.  HENT:  Head: Normocephalic and atraumatic.  Eyes: Conjunctivae are normal. Right eye exhibits no discharge. Left eye exhibits no discharge. No scleral icterus.  Neck: Normal range of motion.  Cardiovascular: Normal rate, regular rhythm and normal heart sounds.   No murmur heard. Respiratory: Effort normal and breath sounds normal. No respiratory distress. She has no wheezes.  GI: Soft. There is no tenderness.  Genitourinary: There is erythema in the vagina. No bleeding in the vagina. Vaginal discharge (moderate amount of clumpy green discharge) found.  Genitourinary Comments: Cervix visually closed  Neurological: She is alert and oriented to person, place, and time.  Skin: Skin is warm and dry. She is not diaphoretic.  Psychiatric: She has a normal mood and affect. Her behavior is normal. Judgment and thought content normal.   Fetal Tracing:  Baseline: 145 Variability: moderate Accelerations: 15x15 Decelerations: none  Toco: irr ctx & UI MAU Course  Procedures  Results for orders placed or performed during the hospital encounter of 05/25/17 (from the past 24 hour(s))  Urinalysis, Routine w reflex microscopic     Status: Abnormal   Collection Time: 05/25/17 10:25 PM  Result Value Ref Range   Color, Urine YELLOW YELLOW   APPearance HAZY (A) CLEAR   Specific Gravity, Urine 1.032 (H) 1.005 - 1.030   pH 6.0 5.0 - 8.0   Glucose, UA NEGATIVE NEGATIVE mg/dL   Hgb urine dipstick NEGATIVE NEGATIVE   Bilirubin Urine NEGATIVE  NEGATIVE   Ketones, ur 5 (A) NEGATIVE mg/dL   Protein, ur 30 (A) NEGATIVE mg/dL   Nitrite NEGATIVE NEGATIVE   Leukocytes, UA MODERATE (A) NEGATIVE   RBC / HPF 0-5 0 - 5 RBC/hpf   WBC, UA 6-30 0 - 5 WBC/hpf   Bacteria, UA NONE SEEN NONE SEEN   Squamous Epithelial / LPF 0-5 (A) NONE SEEN   Mucus PRESENT   Wet prep, genital     Status: Abnormal   Collection Time: 05/25/17 11:03 PM  Result Value Ref Range   Yeast Wet Prep HPF POC PRESENT (A) NONE SEEN   Trich, Wet Prep NONE SEEN NONE SEEN   Clue Cells Wet Prep HPF POC NONE SEEN NONE SEEN   WBC, Wet Prep HPF POC MANY (A) NONE SEEN   Sperm NONE SEEN   CBC     Status: Abnormal   Collection Time: 05/26/17 12:07 AM  Result Value Ref Range   WBC 5.9 4.0 - 10.5 K/uL   RBC 3.87 3.87 - 5.11 MIL/uL   Hemoglobin 10.6 (L) 12.0 - 15.0 g/dL   HCT 16.132.3 (L) 09.636.0 - 04.546.0 %   MCV 83.5 78.0 - 100.0 fL   MCH 27.4 26.0 - 34.0 pg   MCHC 32.8 30.0 - 36.0 g/dL   RDW 40.918.1 (H) 81.111.5 - 91.415.5 %   Platelets 137 (L) 150 - 400 K/uL   No results found.  MDM Reactive NST. Fetal movement audible in room. BPP -- 8/8 with normal AFI. Patient reports increase in movement while in MAU.  Diflucan 150 mg PO in MAU Zofran 8 mg ODT -- improvement in symptoms  Assessment and Plan  A: 1. Vaginal yeast infection   2. [redacted] weeks gestation of pregnancy   3. Decreased fetal movement affecting management of pregnancy in third trimester, single or unspecified fetus   4. History of IUFD   5. NST (non-stress test) reactive   6. Pregnancy related nausea, antepartum    P: Discharge home Rx terazol & zofran Discussed reasons to return to MAU Patient has OB appt tomorrow -- keep appt Fetal kick count  Judeth HornErin Francheska Villeda 05/25/2017, 10:54 PM

## 2017-05-25 NOTE — MAU Note (Signed)
Vaginal itching for a while, frequent yeast infection. Nausea and dry lips

## 2017-05-26 ENCOUNTER — Inpatient Hospital Stay (EMERGENCY_DEPARTMENT_HOSPITAL)
Admission: AD | Admit: 2017-05-26 | Discharge: 2017-05-27 | Disposition: A | Discharge status: Home / Self Care | Payer: Medicaid Other | Source: Ambulatory Visit | Attending: Obstetrics & Gynecology | Admitting: Obstetrics & Gynecology

## 2017-05-26 ENCOUNTER — Encounter (HOSPITAL_COMMUNITY): Payer: Self-pay

## 2017-05-26 ENCOUNTER — Ambulatory Visit (INDEPENDENT_AMBULATORY_CARE_PROVIDER_SITE_OTHER): Payer: Medicaid Other | Admitting: *Deleted

## 2017-05-26 ENCOUNTER — Other Ambulatory Visit (HOSPITAL_COMMUNITY): Payer: Self-pay | Admitting: Obstetrics and Gynecology

## 2017-05-26 ENCOUNTER — Ambulatory Visit (HOSPITAL_COMMUNITY)
Admission: RE | Admit: 2017-05-26 | Discharge: 2017-05-26 | Disposition: A | Discharge status: Home / Self Care | Payer: Medicaid Other | Source: Ambulatory Visit | Attending: Obstetrics & Gynecology | Admitting: Obstetrics & Gynecology

## 2017-05-26 ENCOUNTER — Other Ambulatory Visit: Payer: Self-pay | Admitting: Family Medicine

## 2017-05-26 ENCOUNTER — Encounter (HOSPITAL_COMMUNITY): Payer: Self-pay | Admitting: *Deleted

## 2017-05-26 ENCOUNTER — Ambulatory Visit (INDEPENDENT_AMBULATORY_CARE_PROVIDER_SITE_OTHER): Payer: Self-pay | Admitting: Family Medicine

## 2017-05-26 VITALS — BP 110/75 | HR 88 | Wt 193.8 lb

## 2017-05-26 DIAGNOSIS — O99113 Other diseases of the blood and blood-forming organs and certain disorders involving the immune mechanism complicating pregnancy, third trimester: Secondary | ICD-10-CM

## 2017-05-26 DIAGNOSIS — I868 Varicose veins of other specified sites: Secondary | ICD-10-CM | POA: Insufficient documentation

## 2017-05-26 DIAGNOSIS — B951 Streptococcus, group B, as the cause of diseases classified elsewhere: Secondary | ICD-10-CM

## 2017-05-26 DIAGNOSIS — O09299 Supervision of pregnancy with other poor reproductive or obstetric history, unspecified trimester: Secondary | ICD-10-CM

## 2017-05-26 DIAGNOSIS — O0990 Supervision of high risk pregnancy, unspecified, unspecified trimester: Secondary | ICD-10-CM

## 2017-05-26 DIAGNOSIS — O9989 Other specified diseases and conditions complicating pregnancy, childbirth and the puerperium: Secondary | ICD-10-CM

## 2017-05-26 DIAGNOSIS — O09293 Supervision of pregnancy with other poor reproductive or obstetric history, third trimester: Secondary | ICD-10-CM | POA: Diagnosis present

## 2017-05-26 DIAGNOSIS — O358XX0 Maternal care for other (suspected) fetal abnormality and damage, not applicable or unspecified: Secondary | ICD-10-CM | POA: Insufficient documentation

## 2017-05-26 DIAGNOSIS — Z3A35 35 weeks gestation of pregnancy: Secondary | ICD-10-CM | POA: Insufficient documentation

## 2017-05-26 DIAGNOSIS — O368130 Decreased fetal movements, third trimester, not applicable or unspecified: Secondary | ICD-10-CM | POA: Insufficient documentation

## 2017-05-26 DIAGNOSIS — Z8759 Personal history of other complications of pregnancy, childbirth and the puerperium: Secondary | ICD-10-CM | POA: Insufficient documentation

## 2017-05-26 DIAGNOSIS — B373 Candidiasis of vulva and vagina: Secondary | ICD-10-CM

## 2017-05-26 DIAGNOSIS — D696 Thrombocytopenia, unspecified: Secondary | ICD-10-CM

## 2017-05-26 DIAGNOSIS — Z3689 Encounter for other specified antenatal screening: Secondary | ICD-10-CM

## 2017-05-26 DIAGNOSIS — O0993 Supervision of high risk pregnancy, unspecified, third trimester: Secondary | ICD-10-CM

## 2017-05-26 DIAGNOSIS — O2343 Unspecified infection of urinary tract in pregnancy, third trimester: Secondary | ICD-10-CM

## 2017-05-26 DIAGNOSIS — O364XX0 Maternal care for intrauterine death, not applicable or unspecified: Secondary | ICD-10-CM

## 2017-05-26 LAB — CBC
HEMATOCRIT: 32.3 % — AB (ref 36.0–46.0)
Hemoglobin: 10.6 g/dL — ABNORMAL LOW (ref 12.0–15.0)
MCH: 27.4 pg (ref 26.0–34.0)
MCHC: 32.8 g/dL (ref 30.0–36.0)
MCV: 83.5 fL (ref 78.0–100.0)
Platelets: 137 10*3/uL — ABNORMAL LOW (ref 150–400)
RBC: 3.87 MIL/uL (ref 3.87–5.11)
RDW: 18.1 % — AB (ref 11.5–15.5)
WBC: 5.9 10*3/uL (ref 4.0–10.5)

## 2017-05-26 MED ORDER — TERCONAZOLE 0.8 % VA CREA: 1.0000 | TOPICAL_CREAM | Freq: Every day | VAGINAL | 0 refills | Status: DC

## 2017-05-26 MED ORDER — ONDANSETRON 4 MG PO TBDP: 4.0000 mg | ORAL_TABLET | Freq: Three times a day (TID) | ORAL | 0 refills | Status: DC | PRN

## 2017-05-26 NOTE — Progress Notes (Signed)
   PRENATAL VISIT NOTE  Subjective:  Emily Cain is a 21 y.o. G2P1000 at 6331w4d being seen today for ongoing prenatal care.  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  Patient reports heartburn.  Contractions: Irregular. Vag. Bleeding: None.  Movement: Present. Denies leaking of fluid.   The following portions of the patient's history were reviewed and updated as appropriate: allergies, current medications, past family history, past medical history, past social history, past surgical history and problem list. Problem list updated.  Objective:   Vitals:   05/26/17 1307  BP: 110/75  Pulse: 88  Weight: 193 lb 12.8 oz (87.9 kg)    Fetal Status:     Movement: Present     General:  Alert, oriented and cooperative. Patient is in no acute distress.  Skin: Skin is warm and dry. No rash noted.   Cardiovascular: Normal heart rate noted  Respiratory: Normal respiratory effort, no problems with respiration noted  Abdomen: Soft, gravid, appropriate for gestational age.  Pain/Pressure: Present     Pelvic: Cervical exam deferred        Extremities: Normal range of motion.  Edema: None  Mental Status:  Normal mood and affect. Normal behavior. Normal judgment and thought content.   Assessment and Plan:  Pregnancy: G2P1000 at 5631w4d  1. Supervision of high risk pregnancy, antepartum FHT normal  2. Gestational thrombocytopenia without hemorrhage in third trimester (HCC) Platelets last night: 137  3. Group B Streptococcus urinary tract infection affecting pregnancy in third trimester Intrapartum prophylaxis  4. History of IUFD NST reactive BPP today Growth  today. Induce at 39 weeks  5. HSV  Never had an outbreak - repeat testing neg.  Preterm labor symptoms and general obstetric precautions including but not limited to vaginal bleeding, contractions, leaking of fluid and fetal movement were reviewed in detail with the patient. Please refer to After Visit Summary for other counseling recommendations.  Return in about 6 days (around 06/01/2017) for as scheduled.   Levie HeritageJacob J Ceriah Kohler, DO

## 2017-05-26 NOTE — Progress Notes (Signed)
US for growth today, BPP added

## 2017-05-26 NOTE — Discharge Instructions (Signed)

## 2017-05-26 NOTE — MAU Note (Signed)
PT  SAYS   LAST  TIME  SHE FELT BABY MOVE  WAS -  7PM .   WAS IN OFFICE  TODAY-   TOLD  HAS  SLIGHT   ABRUPTION ,   NO BLEEDING

## 2017-05-26 NOTE — MAU Provider Note (Signed)
History   [id number]   Chief Complaint  Patient presents with  . Decreased Fetal Movement    HPI Emily Cain is a 21 y.o. female  G2P1000 here with report of decreased fetal movement since 7 pm.  Reports feeling the baby move approximately none since 7 pm. Denies vaginal bleeding or leaking of fluid.  Feels occasional contraction. Hx significant for previous term IUFD last year.  Was in office this morning for ROB & ultrasound. Ultrasound showed new umbilical vein varix. BPP 10/10. Last night patient was seen in MAU for DFM & had BPP 10/10.   Patient's last menstrual period was 10/08/2016 (lmp unknown).  OB History  Gravida Para Term Preterm AB Living  2 1 1  0 0 0  SAB TAB Ectopic Multiple Live Births  0 0 0 0      # Outcome Date GA Lbr Len/2nd Weight Sex Delivery Anes PTL Lv  2 Current           1 Term 05/22/16 7218w3d 08:46 / 02:13 8 lb 0.6 oz (3.645 kg) M Vag-Spont EPI  FD      Past Medical History:  Diagnosis Date  . Anemia   . Environmental allergies   . History of IUFD     Family History  Problem Relation Age of Onset  . Cancer Maternal Aunt   . Cancer Maternal Uncle   . Cancer Maternal Grandmother     Social History   Social History  . Marital status: Single    Spouse name: N/A  . Number of children: N/A  . Years of education: N/A   Social History Main Topics  . Smoking status: Never Smoker  . Smokeless tobacco: Never Used  . Alcohol use No  . Drug use: No  . Sexual activity: No   Other Topics Concern  . None   Social History Narrative  . None    No Known Allergies  No current facility-administered medications on file prior to encounter.    Current Outpatient Prescriptions on File Prior to Encounter  Medication Sig Dispense Refill  . aspirin EC 81 MG tablet Take 1 tablet (81 mg total) by mouth daily. 30 tablet 3  . Prenatal Vit-Fe Fumarate-FA (PRENATAL VITAMIN PO) Take by mouth.    . ondansetron (ZOFRAN ODT) 4 MG disintegrating tablet  Take 1 tablet (4 mg total) by mouth every 8 (eight) hours as needed for nausea or vomiting. (Patient not taking: Reported on 05/26/2017) 15 tablet 0  . terconazole (TERAZOL 3) 0.8 % vaginal cream Place 1 applicator vaginally at bedtime. (Patient not taking: Reported on 05/26/2017) 20 g 0     Review of Systems  Constitutional: Negative.   Gastrointestinal: Negative.   Genitourinary: Negative.      Physical Exam   Vitals:   05/26/17 2356 05/27/17 0051  BP: 125/65 113/63  Pulse: 94 97  Resp: 20   Temp: 98.1 F (36.7 C)   TempSrc: Oral   SpO2: 100%     Physical Exam  Nursing note and vitals reviewed. Constitutional: She is oriented to person, place, and time. She appears well-developed and well-nourished. No distress.  HENT:  Head: Normocephalic and atraumatic.  Eyes: Conjunctivae are normal. Right eye exhibits no discharge. Left eye exhibits no discharge. No scleral icterus.  Neck: Normal range of motion.  Respiratory: Effort normal. No respiratory distress.  GI: Soft. There is no tenderness.  Neurological: She is alert and oriented to person, place, and time.  Skin: Skin is warm and  dry. She is not diaphoretic.  Psychiatric: She has a normal mood and affect. Her behavior is normal. Judgment and thought content normal.   Fetal Tracing:  Baseline: 145 Variability: moderate Accelerations:  + Decelerations: none  Toco: none MAU Course  Procedures No results found for this or any previous visit (from the past 24 hour(s)).  MDM Reactive NST Fetal movement heard on monitor. Patient reports fetal movement present in MAU C/w Dr. Irisha Grandmaison Fulling. Has had BPP of 10//10 twice in the last 24 hours. Will discharge home after reactive NST  Assessment and Plan  A: 1. Decreased fetal movements in third trimester, single or unspecified fetus   2. NST (non-stress test) reactive   3. [redacted] weeks gestation of pregnancy    P: Discharge home Discussed fetal movement form Keep f/u  with OB & MFM Discussed reasons to return to MAU   Judeth Horn, NP 05/26/2017 11:56 PM

## 2017-05-27 ENCOUNTER — Other Ambulatory Visit (HOSPITAL_COMMUNITY): Payer: Self-pay | Admitting: *Deleted

## 2017-05-27 DIAGNOSIS — O368130 Decreased fetal movements, third trimester, not applicable or unspecified: Secondary | ICD-10-CM | POA: Diagnosis not present

## 2017-05-27 DIAGNOSIS — Z3A35 35 weeks gestation of pregnancy: Secondary | ICD-10-CM | POA: Diagnosis not present

## 2017-05-27 DIAGNOSIS — O358XX0 Maternal care for other (suspected) fetal abnormality and damage, not applicable or unspecified: Secondary | ICD-10-CM

## 2017-05-27 LAB — CULTURE, OB URINE: CULTURE: NO GROWTH

## 2017-05-27 LAB — GC/CHLAMYDIA PROBE AMP (~~LOC~~) NOT AT ARMC
CHLAMYDIA, DNA PROBE: NEGATIVE
Neisseria Gonorrhea: NEGATIVE

## 2017-05-27 NOTE — Discharge Instructions (Signed)

## 2017-05-30 ENCOUNTER — Encounter (HOSPITAL_COMMUNITY): Payer: Self-pay

## 2017-05-30 ENCOUNTER — Observation Stay (HOSPITAL_COMMUNITY)
Admission: AD | Admit: 2017-05-30 | Discharge: 2017-05-31 | Disposition: A | Discharge status: Home / Self Care | Payer: Medicaid Other | Source: Ambulatory Visit | Attending: Obstetrics & Gynecology | Admitting: Obstetrics & Gynecology

## 2017-05-30 ENCOUNTER — Ambulatory Visit (HOSPITAL_COMMUNITY)
Admission: RE | Admit: 2017-05-30 | Discharge: 2017-05-30 | Disposition: A | Discharge status: Home / Self Care | Payer: Medicaid Other | Source: Ambulatory Visit | Attending: Obstetrics & Gynecology | Admitting: Obstetrics & Gynecology

## 2017-05-30 ENCOUNTER — Other Ambulatory Visit (HOSPITAL_COMMUNITY): Payer: Self-pay | Admitting: Obstetrics and Gynecology

## 2017-05-30 ENCOUNTER — Ambulatory Visit (INDEPENDENT_AMBULATORY_CARE_PROVIDER_SITE_OTHER): Payer: Medicaid Other | Admitting: *Deleted

## 2017-05-30 ENCOUNTER — Inpatient Hospital Stay (HOSPITAL_COMMUNITY): Payer: Medicaid Other

## 2017-05-30 DIAGNOSIS — O0990 Supervision of high risk pregnancy, unspecified, unspecified trimester: Secondary | ICD-10-CM

## 2017-05-30 DIAGNOSIS — O09893 Supervision of other high risk pregnancies, third trimester: Secondary | ICD-10-CM | POA: Diagnosis not present

## 2017-05-30 DIAGNOSIS — O368331 Maternal care for abnormalities of the fetal heart rate or rhythm, third trimester, fetus 1: Principal | ICD-10-CM | POA: Insufficient documentation

## 2017-05-30 DIAGNOSIS — Z3A36 36 weeks gestation of pregnancy: Secondary | ICD-10-CM

## 2017-05-30 DIAGNOSIS — O358XX0 Maternal care for other (suspected) fetal abnormality and damage, not applicable or unspecified: Secondary | ICD-10-CM

## 2017-05-30 DIAGNOSIS — O09293 Supervision of pregnancy with other poor reproductive or obstetric history, third trimester: Secondary | ICD-10-CM | POA: Diagnosis not present

## 2017-05-30 DIAGNOSIS — O09299 Supervision of pregnancy with other poor reproductive or obstetric history, unspecified trimester: Secondary | ICD-10-CM

## 2017-05-30 DIAGNOSIS — Z7982 Long term (current) use of aspirin: Secondary | ICD-10-CM | POA: Diagnosis not present

## 2017-05-30 DIAGNOSIS — Z8759 Personal history of other complications of pregnancy, childbirth and the puerperium: Secondary | ICD-10-CM

## 2017-05-30 DIAGNOSIS — O288 Other abnormal findings on antenatal screening of mother: Secondary | ICD-10-CM

## 2017-05-30 DIAGNOSIS — D612 Aplastic anemia due to other external agents: Secondary | ICD-10-CM | POA: Insufficient documentation

## 2017-05-30 DIAGNOSIS — Z362 Encounter for other antenatal screening follow-up: Secondary | ICD-10-CM

## 2017-05-30 LAB — CBC
HEMATOCRIT: 33.5 % — AB (ref 36.0–46.0)
HEMOGLOBIN: 11 g/dL — AB (ref 12.0–15.0)
MCH: 26.8 pg (ref 26.0–34.0)
MCHC: 32.8 g/dL (ref 30.0–36.0)
MCV: 81.7 fL (ref 78.0–100.0)
Platelets: 132 10*3/uL — ABNORMAL LOW (ref 150–400)
RBC: 4.1 MIL/uL (ref 3.87–5.11)
RDW: 17.8 % — AB (ref 11.5–15.5)
WBC: 5.5 10*3/uL (ref 4.0–10.5)

## 2017-05-30 LAB — TYPE AND SCREEN
ABO/RH(D): B POS
Antibody Screen: NEGATIVE

## 2017-05-30 MED ORDER — SODIUM CHLORIDE 0.9 % IV SOLN: 250.0000 mL | INTRAVENOUS | Status: DC | PRN

## 2017-05-30 MED ORDER — DOCUSATE SODIUM 100 MG PO CAPS
100.0000 mg | ORAL_CAPSULE | Freq: Every day | ORAL | Status: DC
  Filled 2017-05-30: qty 1

## 2017-05-30 MED ORDER — PRENATAL MULTIVITAMIN CH
1.0000 | ORAL_TABLET | Freq: Every day | ORAL | Status: DC
  Administered 2017-05-31: 1 via ORAL
  Filled 2017-05-30: qty 1

## 2017-05-30 MED ORDER — SODIUM CHLORIDE 0.9% FLUSH
3.0000 mL | Freq: Two times a day (BID) | INTRAVENOUS | Status: DC
  Administered 2017-05-31: 3 mL via INTRAVENOUS

## 2017-05-30 MED ORDER — CALCIUM CARBONATE ANTACID 500 MG PO CHEW: 2.0000 | CHEWABLE_TABLET | ORAL | Status: DC | PRN

## 2017-05-30 MED ORDER — ACETAMINOPHEN 325 MG PO TABS: 650.0000 mg | ORAL_TABLET | ORAL | Status: DC | PRN

## 2017-05-30 MED ORDER — SODIUM CHLORIDE 0.9% FLUSH: 3.0000 mL | INTRAVENOUS | Status: DC | PRN

## 2017-05-30 MED ORDER — ZOLPIDEM TARTRATE 5 MG PO TABS: 5.0000 mg | ORAL_TABLET | Freq: Every evening | ORAL | Status: DC | PRN

## 2017-05-30 NOTE — H&P (Signed)
ANTEPARTUM ADMISSION HISTORY AND PHYSICAL NOTE   History of Present Illness: Emily Cain is a 21 y.o. G2P1000 at [redacted]w[redacted]d admitted for observation and repreat BPP.  Patient has a hisotry of term IUFD.  Patient reports the fetal movement as active. Patient reports uterine contraction  activity as irregular, every 10 minutes. Patient reports  vaginal bleeding as none. Patient describes fluid per vagina as None. Fetal presentation is cephalic.  Patient Active Problem List   Diagnosis Date Noted  . Ultrasound scan to recheck fetal heart rate 05/30/2017  . 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 Anemia in pregnancy 04/05/2017  . Gestational thrombocytopenia without hemorrhage in third trimester (HCC) 04/05/2017  . GBS (group B streptococcus) UTI complicating pregnancy 12/20/2016  . Supervision of high risk pregnancy, antepartum 12/13/2016  . History of IUFD 05/22/2016    Past Medical History:  Diagnosis Date  . Anemia   . Environmental allergies   . History of IUFD     Past Surgical History:  Procedure Laterality Date  . NO PAST SURGERIES      OB History  Gravida Para Term Preterm AB Living  2 1 1  0 0 0  SAB TAB Ectopic Multiple Live Births  0 0 0 0      # Outcome Date GA Lbr Len/2nd Weight Sex Delivery Anes PTL Lv  2 Current           1 Term 05/22/16 [redacted]w[redacted]d 08:46 / 02:13 8 lb 0.6 oz (3.645 kg) M Vag-Spont EPI  FD      Social History   Social History  . Marital status: Single    Spouse name: N/A  . Number of children: N/A  . Years of education: N/A   Social History Main Topics  . Smoking status: Never Smoker  . Smokeless tobacco: Never Used  . Alcohol use No  . Drug use: No  . Sexual activity: No   Other Topics Concern  . None   Social History Narrative  . None    Family History  Problem Relation Age of Onset  . Cancer Maternal Aunt   . Cancer Maternal Uncle   . Cancer Maternal Grandmother     No Known  Allergies  Prescriptions Prior to Admission  Medication Sig Dispense Refill Last Dose  . aspirin EC 81 MG tablet Take 1 tablet (81 mg total) by mouth daily. 30 tablet 3 05/29/2017 at Unknown time  . Prenatal Vit-Fe Fumarate-FA (PRENATAL VITAMIN PO) Take 1 tablet by mouth daily.    05/29/2017 at Unknown time  . ondansetron (ZOFRAN ODT) 4 MG disintegrating tablet Take 1 tablet (4 mg total) by mouth every 8 (eight) hours as needed for nausea or vomiting. (Patient not taking: Reported on 05/26/2017) 15 tablet 0 Not Taking  . terconazole (TERAZOL 3) 0.8 % vaginal cream Place 1 applicator vaginally at bedtime. (Patient not taking: Reported on 05/26/2017) 20 g 0 Not Taking    Review of Systems - Negative except nothing  Vitals:  BP 124/67   Pulse 92   LMP 10/08/2016 (LMP Unknown)   SpO2 100%  Physical Examination: CONSTITUTIONAL: Well-developed, well-nourished female in no acute distress.  HENT:  Normocephalic, atraumatic, External right and left ear normal. Oropharynx is clear and moist EYES: Conjunctivae and EOM are normal. Pupils are equal, round, and reactive to light. No scleral icterus.  NECK: Normal range of motion, supple, no masses SKIN: Skin is warm and dry. No rash noted. Not diaphoretic. No erythema. No  pallor. NEUROLGIC: Alert and oriented to person, place, and time. Normal reflexes, muscle tone coordination. No cranial nerve deficit noted. PSYCHIATRIC: Normal mood and affect. Normal behavior. Normal judgment and thought content. CARDIOVASCULAR: Normal heart rate noted, regular rhythm RESPIRATORY: Effort and breath sounds normal, no problems with respiration noted ABDOMEN: Soft, nontender, nondistended, gravid. MUSCULOSKELETAL: Normal range of motion. No edema and no tenderness. 2+ distal pulses.  Cervix: Evaluated by sterile speculum exam. and found to be <0.5 cm/ Long/-2 and fetal presentation is cephalic. Membranes:intact Fetal Monitoring:Baseline: 150 bpm Tocometer:q 5  min  Labs:  No results found for this or any previous visit (from the past 24 hour(s)).  Imaging Studies: [place] Mfm Fetal Bpp Wo Non Stress  Result Date: 05/26/2017 ----------------------------------------------------------------------  OBSTETRICS REPORT                      (Signed Final 05/26/2017 04:12 pm) ---------------------------------------------------------------------- Patient Info  ID #:       [id number]                          D.O.B.:  July 16, 1996 (21 yrs)  Name:       Emily Cain                  Visit Date: 05/26/2017 02:42 pm ---------------------------------------------------------------------- Performed By  Performed By:     Eden Lathe BS      Ref. Address:     [facility]                    RDMS RVT                                                             OB/Gyn Clinic                                                             [address]                                                             [place], [place]                                                             [address]  Attending:        Durwin Nora  Location:         [facility]                    MD  Referred By:      [facility] for                    [facility]                    Healthcare ---------------------------------------------------------------------- Orders   #  Description                                 Code   1  [place] MFM OB FOLLOW UP                         40981.19   2  [place] MFM FETAL BPP WO NON STRESS              14782.95  ----------------------------------------------------------------------   #  Ordered By               Order #        Accession #    Episode #   1  Eulis Foster            [id number]      [id number]     [id number]   2  JACOB STINSON            [id number]      [id number]     [id number]  ---------------------------------------------------------------------- Indications   [redacted] weeks  gestation of pregnancy                Z3A.35   Poor obstetric history: Previous IUFD (37      O09.299   weeks)  ---------------------------------------------------------------------- OB History  Gravidity:    2         Term:   1  Living:       0 ---------------------------------------------------------------------- Fetal Evaluation  Num Of Fetuses:     1  Fetal Heart         168  Rate(bpm):  Cardiac Activity:   Observed  Presentation:       Cephalic  Placenta:           Posterior, above cervical os  P. Cord Insertion:  Previously Visualized  Amniotic Fluid  AFI FV:      Subjectively within normal limits  AFI Sum(cm)     %Tile       Largest Pocket(cm)  21.53           81          6.38  RUQ(cm)       RLQ(cm)       LUQ(cm)        LLQ(cm)  4.8           6.38          5.71           4.64 ---------------------------------------------------------------------- Biophysical Evaluation  Amniotic F.V:   Within normal limits       F. Tone:        Observed  F. Movement:    Observed  Score:          8/8  F. Breathing:   Observed ---------------------------------------------------------------------- Gestational Age  Best:          35w 4d     Det. ByMarcella Dubs         EDD:   06/26/17                                      (12/16/16) ---------------------------------------------------------------------- Anatomy  Cranium:               Appears normal         Aortic Arch:            Previously seen  Cavum:                 Appears normal         Ductal Arch:            Previously seen  Ventricles:            Appears normal         Diaphragm:              Previously seen  Choroid Plexus:        Appears normal         Stomach:                Appears normal, left                                                                        sided  Cerebellum:            Appears normal         Abdomen:                Umbilical vein                                                                        varix (9.68mm)   Posterior Fossa:       Appears normal         Abdominal Wall:         Previously seen  Nuchal Fold:           Previously seen        Cord Vessels:           Appears normal (3                                                                        vessel cord)  Face:  Appears normal         Kidneys:                Appear normal                         (orbits and profile)  Lips:                  Appears normal         Bladder:                Appears normal  Thoracic:              Appears normal         Spine:                  Previously seen  Heart:                 Appears normal         Upper Extremities:      Previously seen                         (4CH, axis, and situs  RVOT:                  Appears normal         Lower Extremities:      Previously seen  LVOT:                  Appears normal  Other:  Heels and 5th digit previously visualized. Female gender previously          seen. ---------------------------------------------------------------------- Cervix Uterus Adnexa  Cervix  Not visualized (advanced GA >29wks)  Uterus  No abnormality visualized.  Left Ovary  Within normal limits.  Right Ovary  Within normal limits.  Cul De Sac:   No free fluid seen.  Adnexa:       No abnormality visualized. 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  there is no filling defect or  evidence of hydrops  Normal amniotic fluid  BPP 8/8 ---------------------------------------------------------------------- Recommendations  Twice weekly BPP was arranged  Fetal kick counts  Delivery between 36-37 weeks (due to UVV). ----------------------------------------------------------------------               Tori Milks, MD Electronically Signed Final Report   05/26/2017 04:12 pm  ----------------------------------------------------------------------  [place] Mfm Fetal Bpp Wo Non Stress  Result Date: 05/26/2017 ----------------------------------------------------------------------  OBSTETRICS REPORT                      (Signed Final 05/26/2017 08:15 am) ---------------------------------------------------------------------- Patient Info  ID #:       [id number]                          D.O.B.:  Jul 04, 1996 (21 yrs)  Name:       Emily Cain                  Visit Date: 05/25/2017 11:10 pm ---------------------------------------------------------------------- Performed By  Performed By:     Ellin Saba        Ref. Address:     [facility]  RDMS                                                             OB/Gyn Clinic                                                             [address]                                                             [place], [place]                                                             [address]  Attending:        Charlsie Merles MD         Location:         [facility]  Referred By:      [facility] for                    [facility]                    Healthcare ---------------------------------------------------------------------- Orders   #  Description                                 Code   1  [place] MFM FETAL BPP WO NON STRESS              60454.09  ----------------------------------------------------------------------   #  Ordered By               Order #        Accession #    Episode #   1  Judeth Horn            [id number]      [id number]     [id number]  ---------------------------------------------------------------------- Indications   [redacted] weeks gestation of pregnancy                Z3A.35   Decreased fetal movement                       O36.8190  Poor obstetric history: Previous IUFD          O09.299   (stillbirth)   ---------------------------------------------------------------------- OB History  Gravidity:    2         Term:   1  Living:       0 ---------------------------------------------------------------------- Fetal Evaluation  Num Of Fetuses:     1  Fetal Heart         158  Rate(bpm):  Cardiac Activity:   Observed  Presentation:       Cephalic  Amniotic Fluid  AFI FV:      Subjectively within normal limits  AFI Sum(cm)     %Tile       Largest Pocket(cm)  18.28           68          6  RUQ(cm)       RLQ(cm)       LUQ(cm)        LLQ(cm)  2.63          4.52          5.13           6 ---------------------------------------------------------------------- Biophysical Evaluation  Amniotic F.V:   Within normal limits       F. Tone:        Observed  F. Movement:    Observed                   Score:          8/8  F. Breathing:   Observed ---------------------------------------------------------------------- Gestational Age  Best:          35w 3d     Det. ByMarcella Dubs         EDD:   06/26/17                                      (12/16/16) ---------------------------------------------------------------------- Anatomy  Stomach:               Appears normal, left   Bladder:                Appears normal                         sided ---------------------------------------------------------------------- Impression  IUP at 35+3 weeks with history of previous IUFD and  decreased fetal movement  Normal amniotic fluid  BPP 8/8 ---------------------------------------------------------------------- Recommendations  Continue clinical evaluation and management ----------------------------------------------------------------------                 Charlsie Merles, MD Electronically Signed Final Report   05/26/2017 08:15 am ----------------------------------------------------------------------  [place] Mfm Ob Follow Up  Result Date: 05/26/2017 ----------------------------------------------------------------------  OBSTETRICS REPORT                       (Signed Final 05/26/2017 04:12 pm) ---------------------------------------------------------------------- Patient Info  ID #:       [id number]                          D.O.B.:  1996-06-18 (21 yrs)  Name:       Emily Cain                  Visit Date: 05/26/2017 02:42 pm ---------------------------------------------------------------------- Performed By  Performed By:  Carrie Stalter BS      Ref. Address:     [facility]                    RDMS RVT                                                             OB[facility]Gyn Clinic                                                             [address]                                                             [address]                                                             [place], [place]                                                             [phone]  Attending:        Durwin NoraJeffrey M Denney       [facility]         [facility]                    MD  Referred By:      [facility]                    Center for                    [facility]                    Health[facility] ---------------------------------------------------------------------- Orders   #  Description                                 Code   1  US MFM OB FOLLOW UP                         [id number]   2  US MFM FETAL BPP WO NON STRESS              [id number]  ----------------------------------------------------------------------   #  Ordered By               Order #        Accession #  Episode #   1  Eulis Foster            [id number]      [id number]     [id number]   2  JACOB STINSON            [id number]      [id number]     [id number]  ---------------------------------------------------------------------- Indications   [redacted] weeks gestation of pregnancy                Z3A.35   Poor obstetric history: Previous IUFD (37      O09.299   weeks)  ---------------------------------------------------------------------- OB History  Gravidity:    2         Term:   1  Living:       0  ---------------------------------------------------------------------- Fetal Evaluation  Num Of Fetuses:     1  Fetal Heart         168  Rate(bpm):  Cardiac Activity:   Observed  Presentation:       Cephalic  Placenta:           Posterior, above cervical os  P. Cord Insertion:  Previously Visualized  Amniotic Fluid  AFI FV:      Subjectively within normal limits  AFI Sum(cm)     %Tile       Largest Pocket(cm)  21.53           81          6.38  RUQ(cm)       RLQ(cm)       LUQ(cm)        LLQ(cm)  4.8           6.38          5.71           4.64 ---------------------------------------------------------------------- Biophysical Evaluation  Amniotic F.V:   Within normal limits       F. Tone:        Observed  F. Movement:    Observed                   Score:          8/8  F. Breathing:   Observed ---------------------------------------------------------------------- Gestational Age  Best:          35w 4d     Det. ByMarcella Dubs         EDD:   06/26/17                                      (12/16/16) ---------------------------------------------------------------------- Anatomy  Cranium:               Appears normal         Aortic Arch:            Previously seen  Cavum:                 Appears normal         Ductal Arch:            Previously seen  Ventricles:            Appears normal         Diaphragm:              Previously seen  Choroid Plexus:        Appears normal  Stomach:                Appears normal, left                                                                        sided  Cerebellum:            Appears normal         Abdomen:                Umbilical vein                                                                        varix (9.35mm)  Posterior Fossa:       Appears normal         Abdominal Wall:         Previously seen  Nuchal Fold:           Previously seen        Cord Vessels:           Appears normal (3                                                                        vessel  cord)  Face:                  Appears normal         Kidneys:                Appear normal                         (orbits and profile)  Lips:                  Appears normal         Bladder:                Appears normal  Thoracic:              Appears normal         Spine:                  Previously seen  Heart:                 Appears normal         Upper Extremities:      Previously seen                         (4CH, axis, and situs  RVOT:                  Appears normal  Lower Extremities:      Previously seen  LVOT:                  Appears normal  Other:  Heels and 5th digit previously visualized. Female gender previously          seen. ---------------------------------------------------------------------- Cervix Uterus Adnexa  Cervix  Not visualized (advanced GA >29wks)  Uterus  No abnormality visualized.  Left Ovary  Within normal limits.  Right Ovary  Within normal limits.  Cul De Sac:   No free fluid seen.  Adnexa:       No abnormality visualized. 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  there is no filling defect or  evidence of hydrops  Normal amniotic fluid  BPP 8/8 ---------------------------------------------------------------------- Recommendations  Twice weekly BPP was arranged  Fetal kick counts  Delivery between 36-37 weeks (due to UVV). ----------------------------------------------------------------------               Tori Milks, MD Electronically Signed Final Report   05/26/2017 04:12 pm ----------------------------------------------------------------------  US Fetal Bpp W/nonstress  Result Date: 05/26/2017 ----------------------------------------------------------------------  OBSTETRICS REPORT                      (Signed Final 05/26/2017 04:49 pm)  ---------------------------------------------------------------------- Patient Info  ID #:       [id number]                          D.O.B.:  01-Jan-1996 (21 yrs)  Name:       Emily Cain                  Visit Date: 05/18/2017 09:15 am ---------------------------------------------------------------------- Performed By  Performed By:     Sedalia Muta Day RNC          Ref. Address:     [facility]                                                             [address]                                                             [place], [place]  [address]  Attending:        Jaynie Collins         Location:         Center for                    MD                                       [facility]  Referred By:      [facility] for                    [facility]                    Healthcare ---------------------------------------------------------------------- Orders   #  Description                                 Code   1  US FETAL BPP W/NONSTRESS                    60454.0  ----------------------------------------------------------------------   #  Ordered By               Order #        Accession #    Episode #   1  Jaynie Collins           [id number]      [id number]     [id number]  ---------------------------------------------------------------------- Service(s) Provided   US Fetal BPP W NST                                   [phone]  ---------------------------------------------------------------------- Indications   [redacted] weeks gestation of pregnancy                Z3A.34   Poor obstetric history: Previous IUFD          O09.299   (stillbirth)  ---------------------------------------------------------------------- OB History   Gravidity:    2         Term:   1  Living:       0 ---------------------------------------------------------------------- Fetal Evaluation  Num Of Fetuses:     1  Preg. Location:     Intrauterine  Cardiac Activity:   Observed  Presentation:       Cephalic  Amniotic Fluid  AFI FV:      Subjectively within normal limits  AFI Sum(cm)     %Tile       Largest Pocket(cm)  15.68           57          6.95  RUQ(cm)       RLQ(cm)       LUQ(cm)        LLQ(cm)  6.95  3.45          3.12           2.16 ---------------------------------------------------------------------- Biophysical Evaluation  Amniotic F.V:   Pocket => 2 cm two         F. Tone:        Observed                  planes  F. Movement:    Observed                   N.S.T:          Reactive  F. Breathing:   Observed                   Score:          10/10 ---------------------------------------------------------------------- Gestational Age  Best:          34w 3d     Det. ByMarcella Dubs         EDD:   06/26/17                                      (12/16/16) ---------------------------------------------------------------------- Impression  Normal amniotic fluid volume. BPP 10/10. ---------------------------------------------------------------------- Recommendations  Continue antenatal testing as recommended. ----------------------------------------------------------------------              Jaynie Collins, MD Electronically Signed Final Report   05/26/2017 04:49 pm ----------------------------------------------------------------------  US Fetal Bpp W/nonstress  Result Date: 05/26/2017 ----------------------------------------------------------------------  OBSTETRICS REPORT                      (Signed Final 05/26/2017 04:47 pm) ---------------------------------------------------------------------- Patient Info  ID #:       [id number]                          D.O.B.:  [birth year] (21 yrs)  Name:       Emily Cain                  Visit Date:  05/11/2017 04:43 pm ---------------------------------------------------------------------- Performed By  Performed By:     Sedalia Muta Day RNC          Ref. Address:     [facility]                                                             [address]                                                             [address]d  [place], [place]                                                             [address]  Attending:        Jaynie Collins         Location:         Center for                    MD                                       [facility]  Referred By:      [facility] for                    [facility]                    Healthcare ---------------------------------------------------------------------- Orders   #  Description                                 Code   1  US FETAL BPP W/NONSTRESS                    60454.0  ----------------------------------------------------------------------   #  Ordered By               Order #        Accession #    Episode #   1  Jaynie Collins           [id number]      [id number]     [id number]  ---------------------------------------------------------------------- Service(s) Provided   US Fetal BPP W NST                                   [phone]  ---------------------------------------------------------------------- Indications   [redacted] weeks gestation of pregnancy                Z3A.33   Poor obstetric history: Previous IUFD          O09.299   (stillbirth)  ---------------------------------------------------------------------- OB History  Gravidity:    2         Term:   1  Living:       0 ---------------------------------------------------------------------- Fetal Evaluation  Num Of Fetuses:     1  Preg. Location:     Intrauterine  Cardiac Activity:    Observed  Presentation:       Cephalic  Amniotic Fluid  AFI FV:      Subjectively upper-normal  AFI Sum(cm)     %Tile  Largest Pocket(cm)  20.47           77          7.07  RUQ(cm)       RLQ(cm)       LUQ(cm)        LLQ(cm)  3.32          6.27          7.07           3.81 ---------------------------------------------------------------------- Biophysical Evaluation  Amniotic F.V:   Pocket => 2 cm two         F. Tone:        Observed                  planes  F. Movement:    Observed                   N.S.T:          Reactive  F. Breathing:   Not Observed               Score:          8/10 ---------------------------------------------------------------------- Gestational Age  Best:          33w 3d     Det. ByMarcella Dubs         EDD:   06/26/17                                      (12/16/16) ---------------------------------------------------------------------- Impression  Normal amniotic fluid volume. BPP 8/10. ---------------------------------------------------------------------- Recommendations  Continue antenatal testing as recommended. ----------------------------------------------------------------------              Jaynie Collins, MD Electronically Signed Final Report   05/26/2017 04:47 pm ----------------------------------------------------------------------  US Fetal Bpp W/nonstress  Result Date: 05/04/2017 ----------------------------------------------------------------------  OBSTETRICS REPORT                      (Signed Final 05/04/2017 04:34 pm) ---------------------------------------------------------------------- Patient Info  ID #:       [id number]                          D.O.B.:  1996/01/19 (21 yrs)  Name:       Emily Cain                  Visit Date: 05/04/2017 03:51 pm ---------------------------------------------------------------------- Performed By  Performed By:     Sedalia Muta Day [facility]          Ref. Address:     [facility]                                                             [address]                                                             [place], [place]                                                             [address]  Attending:        Jaynie Collins         Location:         [facility]                                       [facility]  Referred By:      [facility] for                    [facility]                    Healthcare ---------------------------------------------------------------------- Orders   #  Description                                 Code   1  US FETAL BPP W/NONSTRESS                    60454.0  ----------------------------------------------------------------------   #  Ordered By               Order #        Accession #    Episode #   1  Jaynie Collins           [id number]      [id number]     [id number]  ---------------------------------------------------------------------- Service(s) Provided   US Fetal BPP W NST                                   [phone]  ---------------------------------------------------------------------- Indications   [redacted] weeks gestation of pregnancy                Z3A.32   Poor obstetric history: Previous IUFD          O3.299   (stillbirth)  ---------------------------------------------------------------------- OB History  Gravidity:    2         Term:   1  Living:  0 ---------------------------------------------------------------------- Fetal Evaluation  Num Of Fetuses:     1  Preg. Location:     Intrauterine  Cardiac Activity:   Observed  Presentation:       Cephalic  Amniotic Fluid  AFI FV:      Subjectively within normal limits  AFI Sum(cm)     %Tile       Largest Pocket(cm)  19.35           73          7.54  RUQ(cm)       RLQ(cm)       LUQ(cm)        LLQ(cm)   5.48          3.08          7.54           3.25 ---------------------------------------------------------------------- Biophysical Evaluation  Amniotic F.V:   Pocket => 2 cm two         F. Tone:        Observed                  planes  F. Movement:    Observed                   N.S.T:          Reactive  F. Breathing:   Not Observed               Score:          8/10 ---------------------------------------------------------------------- Gestational Age  Best:          32w 3d     Det. ByMarcella Dubs         EDD:   06/26/17                                      (12/16/16) ---------------------------------------------------------------------- Impression  Normal amniotic fluid volume. BPP 8/10 (no breathing).  Overall reassuring. ---------------------------------------------------------------------- Recommendations  Continue recommended antenatal testing. ----------------------------------------------------------------------               Jaynie Collins, MD Electronically Signed Final Report   05/04/2017 04:34 pm ----------------------------------------------------------------------    Assessment and Plan: Patient Active Problem List   Diagnosis Date Noted  . Ultrasound scan to recheck fetal heart rate 05/30/2017  . 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 Anemia in pregnancy 04/05/2017  . Gestational thrombocytopenia without hemorrhage in third trimester (HCC) 04/05/2017  . GBS (group B streptococcus) UTI complicating pregnancy 12/20/2016  . Supervision of high risk pregnancy, antepartum 12/13/2016  . History of IUFD 05/22/2016   Admit to Antenatal Routine antenatal care Repeat BPP in the AM GC CT cultures  Marylene Land, CNM [facility], [facility]

## 2017-05-30 NOTE — Progress Notes (Signed)
Pt had BPP 6/8 @ MFM today.  Dr. Debroah LoopArnold arrived to speak w/pt and significant other regarding plan of care due to NR NST.  Pt taken to MAU for further evaluation.

## 2017-05-30 NOTE — MAU Note (Signed)
Pt sent from clinic for NR NST, BPP 6/8.

## 2017-05-30 NOTE — MAU Provider Note (Signed)
  History     CSN: [id number]  Arrival date and time: 05/30/17 1616   None     Chief Complaint  Patient presents with  . Non-stress Test   HPI   Ms.Emily Cain is a 21 y.o. female G2P1000 @ 3249w1d here in MAU for extended fetal monitoring. States she was in the office today and the baby scored low on the BPP, she was sent her for monitoring. States she is feeling her baby move. No bleeding.  Has history of previous 37 week IUFD.   OB History    Gravida Para Term Preterm AB Living   2 1 1  0 0 0   SAB TAB Ectopic Multiple Live Births   0 0 0 0        Past Medical History:  Diagnosis Date  . Anemia   . Environmental allergies   . History of IUFD     Past Surgical History:  Procedure Laterality Date  . NO PAST SURGERIES      Family History  Problem Relation Age of Onset  . Cancer Maternal Aunt   . Cancer Maternal Uncle   . Cancer Maternal Grandmother     Social History  Substance Use Topics  . Smoking status: Never Smoker  . Smokeless tobacco: Never Used  . Alcohol use No    Allergies: No Known Allergies  Prescriptions Prior to Admission  Medication Sig Dispense Refill Last Dose  . aspirin EC 81 MG tablet Take 1 tablet (81 mg total) by mouth daily. 30 tablet 3 05/29/2017 at Unknown time  . Prenatal Vit-Fe Fumarate-FA (PRENATAL VITAMIN PO) Take 1 tablet by mouth daily.    05/29/2017 at Unknown time  . ondansetron (ZOFRAN ODT) 4 MG disintegrating tablet Take 1 tablet (4 mg total) by mouth every 8 (eight) hours as needed for nausea or vomiting. (Patient not taking: Reported on 05/26/2017) 15 tablet 0 Not Taking  . terconazole (TERAZOL 3) 0.8 % vaginal cream Place 1 applicator vaginally at bedtime. (Patient not taking: Reported on 05/26/2017) 20 g 0 Not Taking   No results found for this or any previous visit (from the past 48 hour(s)).  Review of Systems  Gastrointestinal: Negative for abdominal pain.  Genitourinary: Negative for pelvic pain, vaginal  bleeding and vaginal discharge.   Physical Exam   Blood pressure 124/67, pulse 92, last menstrual period 10/08/2016, SpO2 100 %.  Physical Exam  Constitutional: She is oriented to person, place, and time. She appears well-developed and well-nourished. No distress.  HENT:  Head: Normocephalic.  Eyes: Pupils are equal, round, and reactive to light.  GI: Soft. She exhibits no distension. There is no tenderness. There is no rebound.  Musculoskeletal: Normal range of motion.  Neurological: She is alert and oriented to person, place, and time.  Skin: Skin is warm. She is not diaphoretic.  Psychiatric: Her behavior is normal.   Fetal Tracing: Baseline: 150 bpm Variability: Moderate  Accelerations: 15x15 Decelerations: None Toco: None  MAU Course  Procedures  None  MDM  NST reactive BPP 6/8, discussed with Dr. Debroah LoopArnold; will admit for observation   Assessment and Plan   A:  1. Non-reactive NST (non-stress test)   2. Supervision of high risk pregnancy, antepartum   3. Encounter for repeat ultrasound for fetal heart rate     P:  Admit to high risk OB for observation.   Duane Lopeasch, Jennifer I, NP 05/30/2017 9:14 PM

## 2017-05-31 ENCOUNTER — Observation Stay (HOSPITAL_COMMUNITY): Payer: Medicaid Other

## 2017-05-31 LAB — GC/CHLAMYDIA PROBE AMP (~~LOC~~) NOT AT ARMC
Chlamydia: NEGATIVE
NEISSERIA GONORRHEA: NEGATIVE

## 2017-05-31 NOTE — Discharge Summary (Signed)
Physician Discharge Summary  Patient ID: Emily Cain MRN: [id number] DOB/AGE: May 04, 1996 21 y.o.  Admit date: 05/30/2017 Discharge date: 05/31/2017  Admission Diagnoses: IUP 36 2/7 weeks, Non reassuring fetal testing, H/O term FDIU  Discharge Diagnoses: SAA  Discharged Condition: good  Hospital Course: Pt was admitted for observation after non reassuring fetal testing d/t h/o term FDIU. Pt was observed overnite with repeat BPP 8/8 with reactive NST BPP 10/10. Pt reports good fetal movement. No LOF, VB or ut ctx. Pt was discharged home to continue with routine OB appt tomorrow and BPP on Thursday. Case discussed with MFM who recommended IOL at 37 weeks d/t to H/O and umbilical vein varix.   Consults: MFM  Significant Diagnostic Studies: U/S  Treatments: IV hydration  Discharge Exam: Blood pressure (!) 107/57, pulse 92, temperature 98.1 F (36.7 C), temperature source Oral, resp. rate 18, height 5\' 7"  (1.702 m), weight 88 kg (194 lb 0.4 oz), last menstrual period 10/08/2016, SpO2 100 %.  Lungs clear Heart RRR Abd soft + BS gravid non tender Ext non tender  Disposition: 01-Home or Self Care  Discharge Instructions    Discharge activity:  No Restrictions    Complete by:  As directed    Discharge diet:  No restrictions    Complete by:  As directed    Fetal Kick Count:  Lie on our left side for one hour after a meal, and count the number of times your baby kicks.  If it is less than 5 times, get up, move around and drink some juice.  Repeat the test 30 minutes later.  If it is still less than 5 kicks in an hour, notify your doctor.    Complete by:  As directed    No sexual activity restrictions    Complete by:  As directed      Allergies as of 05/31/2017   No Known Allergies     Medication List    STOP taking these medications   ondansetron 4 MG disintegrating tablet Commonly known as:  ZOFRAN ODT   terconazole 0.8 % vaginal cream Commonly known as:  TERAZOL 3      TAKE these medications   aspirin EC 81 MG tablet Take 1 tablet (81 mg total) by mouth daily.   PRENATAL VITAMIN PO Take 1 tablet by mouth daily.      Follow-up Information    [facility]   Why:  Pt already has appt for tomorrow  Contact information: [address] [place] [phone] [phone]          Signed: Hermina StaggersMichael L Ilianna Bown 05/31/2017, 3:12 PM

## 2017-05-31 NOTE — Progress Notes (Signed)
Discharge instructions given. Questions answered, pt states understanding. Pt signs and given copy.

## 2017-06-01 ENCOUNTER — Ambulatory Visit (INDEPENDENT_AMBULATORY_CARE_PROVIDER_SITE_OTHER): Payer: Self-pay | Admitting: Obstetrics & Gynecology

## 2017-06-01 ENCOUNTER — Other Ambulatory Visit: Payer: Self-pay | Admitting: Obstetrics and Gynecology

## 2017-06-01 ENCOUNTER — Other Ambulatory Visit: Payer: Self-pay

## 2017-06-01 VITALS — BP 127/73 | HR 89 | Wt 194.1 lb

## 2017-06-01 DIAGNOSIS — Z8759 Personal history of other complications of pregnancy, childbirth and the puerperium: Secondary | ICD-10-CM

## 2017-06-01 DIAGNOSIS — O99891 Other specified diseases and conditions complicating pregnancy: Secondary | ICD-10-CM | POA: Insufficient documentation

## 2017-06-01 DIAGNOSIS — IMO0001 Reserved for inherently not codable concepts without codable children: Secondary | ICD-10-CM

## 2017-06-01 DIAGNOSIS — O0993 Supervision of high risk pregnancy, unspecified, third trimester: Secondary | ICD-10-CM

## 2017-06-01 DIAGNOSIS — O0990 Supervision of high risk pregnancy, unspecified, unspecified trimester: Secondary | ICD-10-CM

## 2017-06-01 NOTE — Progress Notes (Signed)
   PRENATAL VISIT NOTE  Subjective:  Emily Cain is a 21 y.o. G2P1000 at 5588w3d being seen today for ongoing prenatal care.  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  Patient reports no complaints.  Contractions: Irregular. Vag. Bleeding: None.  Movement: Present. Denies leaking of fluid.   The following portions of the patient's history were reviewed and updated as appropriate: allergies, current medications, past family history, past medical history, past social history, past surgical history and problem list. Problem list updated.  Objective:   Vitals:   06/01/17 0810  BP: 127/73  Pulse: 89  Weight: 194 lb 1.6 oz (88 kg)    Fetal Status: Fetal Heart Rate (bpm): 152   Movement: Present     General:  Alert, oriented and cooperative. Patient is in no acute distress.  Skin: Skin is warm and dry. No rash noted.   Cardiovascular: Normal heart rate noted  Respiratory: Normal respiratory effort, no problems with respiration noted  Abdomen: Soft, gravid, appropriate for gestational age.  Pain/Pressure: Present     Pelvic: Cervical exam deferred        Extremities: Normal range of motion.  Edema: None  Mental Status:  Normal mood and affect. Normal behavior. Normal judgment and thought content.   Assessment and Plan:  Pregnancy: G2P1000 at 8988w3d  1. History of IUFD BPP 8/8 yesterday and repeat is tomorrow  2. Supervision of high risk pregnancy, antepartum   3. 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 Please refer to After Visit Summary for other counseling recommendations.  Return in about 1 month (around 07/02/2017) for postpartum.   Scheryl DarterJames Dnasia Gauna, MD

## 2017-06-01 NOTE — Patient Instructions (Signed)
Labor Induction Labor induction is when steps are taken to cause a pregnant woman to begin the labor process. Most women go into labor on their own between 37 weeks and 42 weeks of the pregnancy. When this does not happen or when there is a medical need, methods may be used to induce labor. Labor induction causes a pregnant woman's uterus to contract. It also causes the cervix to soften (ripen), open (dilate), and thin out (efface). Usually, labor is not induced before 39 weeks of the pregnancy unless there is a problem with the baby or mother. Before inducing labor, your health care provider will consider a number of factors, including the following:  The medical condition of you and the baby.  How many weeks along you are.  The status of the baby's lung maturity.  The condition of the cervix.  The position of the baby. What are the reasons for labor induction? Labor may be induced for the following reasons:  The health of the baby or mother is at risk.  The pregnancy is overdue by 1 week or more.  The water breaks but labor does not start on its own.  The mother has a health condition or serious illness, such as high blood pressure, infection, placental abruption, or diabetes.  The amniotic fluid amounts are low around the baby.  The baby is distressed. Convenience or wanting the baby to be born on a certain date is not a reason for inducing labor. What methods are used for labor induction? Several methods of labor induction may be used, such as:  Prostaglandin medicine. This medicine causes the cervix to dilate and ripen. The medicine will also start contractions. It can be taken by mouth or by inserting a suppository into the vagina.  Inserting a thin tube (catheter) with a balloon on the end into the vagina to dilate the cervix. Once inserted, the balloon is expanded with water, which causes the cervix to open.  Stripping the membranes. Your health care provider separates  amniotic sac tissue from the cervix, causing the cervix to be stretched and causing the release of a hormone called progesterone. This may cause the uterus to contract. It is often done during an office visit. You will be sent home to wait for the contractions to begin. You will then come in for an induction.  Breaking the water. Your health care provider makes a hole in the amniotic sac using a small instrument. Once the amniotic sac breaks, contractions should begin. This may still take hours to see an effect.  Medicine to trigger or strengthen contractions. This medicine is given through an IV access tube inserted into a vein in your arm. All of the methods of induction, besides stripping the membranes, will be done in the hospital. Induction is done in the hospital so that you and the baby can be carefully monitored. How long does it take for labor to be induced? Some inductions can take up to 2-3 days. Depending on the cervix, it usually takes less time. It takes longer when you are induced early in the pregnancy or if this is your first pregnancy. If a mother is still pregnant and the induction has been going on for 2-3 days, either the mother will be sent home or a cesarean delivery will be needed. What are the risks associated with labor induction? Some of the risks of induction include:  Changes in fetal heart rate, such as too high, too low, or erratic.  Fetal distress.    Chance of infection for the mother and baby.  Increased chance of having a cesarean delivery.  Breaking off (abruption) of the placenta from the uterus (rare).  Uterine rupture (very rare). When induction is needed for medical reasons, the benefits of induction may outweigh the risks. What are some reasons for not inducing labor? Labor induction should not be done if:  It is shown that your baby does not tolerate labor.  You have had previous surgeries on your uterus, such as a myomectomy or the removal of  fibroids.  Your placenta lies very low in the uterus and blocks the opening of the cervix (placenta previa).  Your baby is not in a head-down position.  The umbilical cord drops down into the birth canal in front of the baby. This could cut off the baby's blood and oxygen supply.  You have had a previous cesarean delivery.  There are unusual circumstances, such as the baby being extremely premature. This information is not intended to replace advice given to you by your health care provider. Make sure you discuss any questions you have with your health care provider. Document Released: 12/15/2006 Document Revised: 01/01/2016 Document Reviewed: 02/22/2013 Elsevier Interactive Patient Education  2017 Elsevier Inc.  

## 2017-06-01 NOTE — Progress Notes (Signed)
IOL scheduled 10/28 @ 0730

## 2017-06-02 ENCOUNTER — Telehealth (HOSPITAL_COMMUNITY): Payer: Self-pay | Admitting: *Deleted

## 2017-06-02 ENCOUNTER — Encounter (HOSPITAL_COMMUNITY): Payer: Self-pay

## 2017-06-02 ENCOUNTER — Ambulatory Visit (HOSPITAL_COMMUNITY)
Admission: RE | Admit: 2017-06-02 | Discharge: 2017-06-02 | Disposition: A | Discharge status: Home / Self Care | Payer: Medicaid Other | Source: Ambulatory Visit | Attending: Obstetrics & Gynecology | Admitting: Obstetrics & Gynecology

## 2017-06-02 ENCOUNTER — Other Ambulatory Visit (HOSPITAL_COMMUNITY): Payer: Self-pay | Admitting: Obstetrics and Gynecology

## 2017-06-02 DIAGNOSIS — O358XX0 Maternal care for other (suspected) fetal abnormality and damage, not applicable or unspecified: Secondary | ICD-10-CM

## 2017-06-02 DIAGNOSIS — Z3A36 36 weeks gestation of pregnancy: Secondary | ICD-10-CM | POA: Diagnosis present

## 2017-06-02 DIAGNOSIS — O09293 Supervision of pregnancy with other poor reproductive or obstetric history, third trimester: Secondary | ICD-10-CM | POA: Insufficient documentation

## 2017-06-02 DIAGNOSIS — O0990 Supervision of high risk pregnancy, unspecified, unspecified trimester: Secondary | ICD-10-CM

## 2017-06-02 DIAGNOSIS — IMO0001 Reserved for inherently not codable concepts without codable children: Secondary | ICD-10-CM

## 2017-06-02 NOTE — Procedures (Signed)
[name] 01-31-1996 7568w4d  Fetus A Non-Stress Test Interpretation for 06/02/17  Indication: Unsatisfactory BPP  Fetal Heart Rate A Mode: External Baseline Rate (A): 145 bpm Variability: Moderate Accelerations: 10 x 10, 15 x 15 Decelerations: None Multiple birth?: No  Uterine Activity Mode: Palpation, Toco Contraction Frequency (min): x2 Contraction Duration (sec): 60-80 Contraction Quality: Mild Resting Tone Palpated: Relaxed Resting Time: Adequate  Interpretation (Fetal Testing) Nonstress Test Interpretation: Reactive Overall Impression: Reassuring for gestational age Comments: Reviewed tracing with Dr. Archie PattenBunce

## 2017-06-02 NOTE — Telephone Encounter (Signed)
Preadmission screen  

## 2017-06-05 ENCOUNTER — Inpatient Hospital Stay (HOSPITAL_COMMUNITY)
Admission: RE | Admit: 2017-06-05 | Discharge: 2017-06-08 | DRG: 807 | Disposition: A | Discharge status: Home / Self Care | Payer: Medicaid Other | Source: Ambulatory Visit | Attending: Obstetrics and Gynecology | Admitting: Obstetrics and Gynecology

## 2017-06-05 ENCOUNTER — Encounter (HOSPITAL_COMMUNITY): Payer: Self-pay

## 2017-06-05 DIAGNOSIS — O26899 Other specified pregnancy related conditions, unspecified trimester: Secondary | ICD-10-CM | POA: Diagnosis present

## 2017-06-05 DIAGNOSIS — D649 Anemia, unspecified: Secondary | ICD-10-CM | POA: Diagnosis present

## 2017-06-05 DIAGNOSIS — Z3A37 37 weeks gestation of pregnancy: Secondary | ICD-10-CM

## 2017-06-05 DIAGNOSIS — O99891 Other specified diseases and conditions complicating pregnancy: Secondary | ICD-10-CM | POA: Diagnosis present

## 2017-06-05 DIAGNOSIS — O9902 Anemia complicating childbirth: Secondary | ICD-10-CM | POA: Diagnosis present

## 2017-06-05 DIAGNOSIS — O99824 Streptococcus B carrier state complicating childbirth: Secondary | ICD-10-CM | POA: Diagnosis present

## 2017-06-05 DIAGNOSIS — O0990 Supervision of high risk pregnancy, unspecified, unspecified trimester: Secondary | ICD-10-CM

## 2017-06-05 LAB — CBC
HCT: 30.7 % — ABNORMAL LOW (ref 36.0–46.0)
Hemoglobin: 10.6 g/dL — ABNORMAL LOW (ref 12.0–15.0)
MCH: 27.9 pg (ref 26.0–34.0)
MCHC: 34.5 g/dL (ref 30.0–36.0)
MCV: 80.8 fL (ref 78.0–100.0)
Platelets: 152 10*3/uL (ref 150–400)
RBC: 3.8 MIL/uL — ABNORMAL LOW (ref 3.87–5.11)
RDW: 17.7 % — ABNORMAL HIGH (ref 11.5–15.5)
WBC: 4.9 10*3/uL (ref 4.0–10.5)

## 2017-06-05 LAB — TYPE AND SCREEN
ABO/RH(D): B POS
Antibody Screen: NEGATIVE

## 2017-06-05 MED ORDER — LACTATED RINGERS IV SOLN: 500.0000 mL | INTRAVENOUS | Status: DC | PRN

## 2017-06-05 MED ORDER — MISOPROSTOL 50MCG HALF TABLET
50.0000 ug | ORAL_TABLET | ORAL | Status: DC
  Administered 2017-06-05: 50 ug via ORAL

## 2017-06-05 MED ORDER — EPHEDRINE 5 MG/ML INJ
10.0000 mg | INTRAVENOUS | Status: DC | PRN
  Filled 2017-06-05: qty 2

## 2017-06-05 MED ORDER — OXYTOCIN 40 UNITS IN LACTATED RINGERS INFUSION - SIMPLE MED
2.5000 [IU]/h | INTRAVENOUS | Status: DC
  Filled 2017-06-05: qty 1000

## 2017-06-05 MED ORDER — OXYTOCIN 40 UNITS IN LACTATED RINGERS INFUSION - SIMPLE MED: 1.0000 m[IU]/min | INTRAVENOUS | Status: DC

## 2017-06-05 MED ORDER — ACETAMINOPHEN 325 MG PO TABS: 650.0000 mg | ORAL_TABLET | ORAL | Status: DC | PRN

## 2017-06-05 MED ORDER — OXYTOCIN BOLUS FROM INFUSION: 500.0000 mL | Freq: Once | INTRAVENOUS | Status: DC

## 2017-06-05 MED ORDER — PENICILLIN G POTASSIUM 5000000 UNITS IJ SOLR
5.0000 10*6.[IU] | Freq: Once | INTRAVENOUS | Status: AC
  Administered 2017-06-05: 5 10*6.[IU] via INTRAVENOUS
  Filled 2017-06-05: qty 5

## 2017-06-05 MED ORDER — PHENYLEPHRINE 40 MCG/ML (10ML) SYRINGE FOR IV PUSH (FOR BLOOD PRESSURE SUPPORT)
80.0000 ug | PREFILLED_SYRINGE | INTRAVENOUS | Status: DC | PRN
  Filled 2017-06-05: qty 10
  Filled 2017-06-05: qty 5

## 2017-06-05 MED ORDER — PENICILLIN G POT IN DEXTROSE 60000 UNIT/ML IV SOLN
3.0000 10*6.[IU] | INTRAVENOUS | Status: DC
  Administered 2017-06-05 – 2017-06-06 (×4): 3 10*6.[IU] via INTRAVENOUS
  Filled 2017-06-05 (×7): qty 50

## 2017-06-05 MED ORDER — OXYCODONE-ACETAMINOPHEN 5-325 MG PO TABS
1.0000 | ORAL_TABLET | ORAL | Status: DC | PRN
Start: 2017-06-05 — End: 2017-06-06

## 2017-06-05 MED ORDER — ONDANSETRON HCL 4 MG/2ML IJ SOLN
4.0000 mg | Freq: Four times a day (QID) | INTRAMUSCULAR | Status: DC | PRN
  Administered 2017-06-05: 4 mg via INTRAVENOUS
  Filled 2017-06-05: qty 2

## 2017-06-05 MED ORDER — MISOPROSTOL 50MCG HALF TABLET
50.0000 ug | ORAL_TABLET | ORAL | Status: DC
  Filled 2017-06-05: qty 1

## 2017-06-05 MED ORDER — PHENYLEPHRINE 40 MCG/ML (10ML) SYRINGE FOR IV PUSH (FOR BLOOD PRESSURE SUPPORT)
80.0000 ug | PREFILLED_SYRINGE | INTRAVENOUS | Status: DC | PRN
  Filled 2017-06-05: qty 5

## 2017-06-05 MED ORDER — OXYTOCIN 40 UNITS IN LACTATED RINGERS INFUSION - SIMPLE MED
1.0000 m[IU]/min | INTRAVENOUS | Status: DC
  Administered 2017-06-05: 2 m[IU]/min via INTRAVENOUS

## 2017-06-05 MED ORDER — TERBUTALINE SULFATE 1 MG/ML IJ SOLN
0.2500 mg | Freq: Once | INTRAMUSCULAR | Status: DC | PRN
  Filled 2017-06-05: qty 1

## 2017-06-05 MED ORDER — LACTATED RINGERS IV SOLN
INTRAVENOUS | Status: DC
  Administered 2017-06-05 – 2017-06-06 (×3): via INTRAVENOUS

## 2017-06-05 MED ORDER — SOD CITRATE-CITRIC ACID 500-334 MG/5ML PO SOLN
30.0000 mL | ORAL | Status: DC | PRN
  Administered 2017-06-05: 30 mL via ORAL
  Filled 2017-06-05: qty 15

## 2017-06-05 MED ORDER — LIDOCAINE HCL (PF) 1 % IJ SOLN
30.0000 mL | INTRAMUSCULAR | Status: DC | PRN
  Filled 2017-06-05: qty 30

## 2017-06-05 MED ORDER — OXYCODONE-ACETAMINOPHEN 5-325 MG PO TABS: 2.0000 | ORAL_TABLET | ORAL | Status: DC | PRN

## 2017-06-05 MED ORDER — FENTANYL 2.5 MCG/ML BUPIVACAINE 1/10 % EPIDURAL INFUSION (WH - ANES)
14.0000 mL/h | INTRAMUSCULAR | Status: DC | PRN
  Administered 2017-06-06: 14 mL/h via EPIDURAL
  Filled 2017-06-05: qty 100

## 2017-06-05 MED ORDER — FENTANYL CITRATE (PF) 100 MCG/2ML IJ SOLN
100.0000 ug | INTRAMUSCULAR | Status: DC | PRN
  Administered 2017-06-05: 100 ug via INTRAVENOUS
  Filled 2017-06-05: qty 2

## 2017-06-05 MED ORDER — FLEET ENEMA 7-19 GM/118ML RE ENEM: 1.0000 | ENEMA | RECTAL | Status: DC | PRN

## 2017-06-05 MED ORDER — TERBUTALINE SULFATE 1 MG/ML IJ SOLN: 0.2500 mg | Freq: Once | INTRAMUSCULAR | Status: DC | PRN

## 2017-06-05 MED ORDER — LACTATED RINGERS IV SOLN
500.0000 mL | Freq: Once | INTRAVENOUS | Status: AC
  Administered 2017-06-05: 500 mL via INTRAVENOUS

## 2017-06-05 MED ORDER — DIPHENHYDRAMINE HCL 50 MG/ML IJ SOLN: 12.5000 mg | INTRAMUSCULAR | Status: DC | PRN

## 2017-06-05 NOTE — Progress Notes (Signed)
Labor Progress Note Kyung RuddKenyadia Cullins is a 21 y.o. G2P1000 at 8756w0d presented for induction of labor for umbilical varix  S: No complaints  O:  BP 116/69   Pulse 94   Temp 98.5 F (36.9 C) (Oral)   Resp 18   Ht 5\' 7"  (1.702 m)   Wt 194 lb 4 oz (88.1 kg)   LMP 10/08/2016 (LMP Unknown)   SpO2 100%   BMI 30.42 kg/m  EFM: 150 bpm/mod var/decels, variable vs late  CVE: Dilation: 5 Effacement (%): 70 Cervical Position: Middle Station: -2, -1 Presentation: Vertex Exam by:: Dr. Rachelle HoraMoss   A&P: 21 y.o. G2P1000 2756w0d here for IOL 2/2 umbilical varix #Labor: Cervix unchanged but only recently restarted pitocin at 592mU/min. Placed IUPC and FSE. Attempt to increase pitocin to 10 mU/min which was about half of previous dose before stopping. #Pain: planning epidural #FWB: cat 2  Rolm BookbinderAmber Dhani Dannemiller, DO 11:50 PM

## 2017-06-05 NOTE — Progress Notes (Signed)
Emily RuddKenyadia Cain is a 21 y.o. G2P1000 at 2154w0d admitted for induction of labor due to umbilical varix.  Subjective: Pt sleeping at this time.  Objective: BP (!) 108/54   Pulse 88   Temp 98.2 F (36.8 C) (Oral)   Resp 16   Ht 5\' 7"  (1.702 m)   Wt 88.1 kg (194 lb 4 oz)   LMP 10/08/2016 (LMP Unknown)   SpO2 100%   BMI 30.42 kg/m  No intake/output data recorded. No intake/output data recorded.  FHT:  FHR: 140s bpm, variability: minimal ,  accelerations:  Abscent,  decelerations:  Absent UC:   irregular, every 2-7 minutes SVE:   Dilation: 1.5 Effacement (%): 70 Station: -2 Exam by:: Emily Cain,CNM  Membranes intact  Assessment / Plan: Induction of labor due to umbilical varix,  progressing well on pitocin  Labor: Progressing on Pitocin, will continue to increase then AROM Fetal Wellbeing:  Category II Pain Control:  Labor support without medications I/D:  GBS pos; IV abx intrapartum Anticipated MOD:  NSVD  Cinthya Bowmaker-Kareen, SNM 06/05/2017, 2:23 PM I assessed this pt and agree with this assessment

## 2017-06-05 NOTE — H&P (Signed)
Emily Cain is a 21 y.o. female G2P1000 @ 37 wks  presenting for IOL for unbilical varix. OB History    Gravida Para Term Preterm AB Living   2 1 1  0 0 0   SAB TAB Ectopic Multiple Live Births   0 0 0 0 0     Past Medical History:  Diagnosis Date  . Anemia   . Environmental allergies   . History of IUFD    Past Surgical History:  Procedure Laterality Date  . NO PAST SURGERIES     Family History: family history includes Cancer in her maternal aunt, maternal grandmother, and maternal uncle. Social History:  reports that she has never smoked. She has never used smokeless tobacco. She reports that she does not drink alcohol or use drugs.     Maternal Diabetes: No Genetic Screening: Normal Maternal Ultrasounds/Referrals: Abnormal:  Findings:   Other: cord varix Fetal Ultrasounds or other Referrals:  Referred to Materal Fetal Medicine  Maternal Substance Abuse:  No Significant Maternal Medications:  None Significant Maternal Lab Results:  None Other Comments:  None  Review of Systems  Constitutional: Negative.   HENT: Negative.   Eyes: Negative.   Respiratory: Negative.   Cardiovascular: Negative.   Gastrointestinal: Negative.   Genitourinary: Negative.   Musculoskeletal: Negative.   Skin: Negative.   Neurological: Negative.   Endo/Heme/Allergies: Negative.   Psychiatric/Behavioral: Negative.    Maternal Medical History:  Reason for admission: IOL for umbilical cord varix  Fetal activity: Perceived fetal activity is normal.    Prenatal complications: Umbilical cord varix  Prenatal Complications - Diabetes: none.      Last menstrual period 10/08/2016. Maternal Exam:  Uterine Assessment: none  Abdomen: Patient reports no abdominal tenderness. Fetal presentation: vertex  Introitus: Normal vulva. Normal vagina.  Ferning test: not done.  Nitrazine test: not done. Amniotic fluid character: not assessed.  Pelvis: adequate for delivery.   Cervix: Cervix  evaluated by digital exam.     Fetal Exam Fetal Monitor Review: Mode: ultrasound.   Variability: moderate (6-25 bpm).   Pattern: accelerations present.    Fetal State Assessment: Category I - tracings are normal.     Physical Exam  Constitutional: She is oriented to person, place, and time. She appears well-developed and well-nourished.  HENT:  Head: Normocephalic.  Eyes: Pupils are equal, round, and reactive to light.  Neck: Normal range of motion.  Cardiovascular: Normal rate, regular rhythm, normal heart sounds and intact distal pulses.   Respiratory: Effort normal and breath sounds normal.  GI: Soft. Bowel sounds are normal.  Genitourinary: Vagina normal and uterus normal.  Musculoskeletal: Normal range of motion.  Neurological: She is alert and oriented to person, place, and time. She has normal reflexes.  Skin: Skin is warm and dry.  Psychiatric: She has a normal mood and affect. Her behavior is normal. Judgment and thought content normal.    Prenatal labs: ABO, Rh: --/--/B POS (10/22 2135) Antibody: NEG (10/22 2135) Rubella: 2.28 (05/07 1427) RPR: Non Reactive (08/16 0927)  HBsAg: Negative (05/07 1427)  HIV:    GBS: Positive (05/07 0000)   Assessment/Plan: preg at 37 wks umbilical cord varix IOL Pit induction of labor   Emily Cain 06/05/2017, 8:58 AM

## 2017-06-05 NOTE — Progress Notes (Signed)
Emily RuddKenyadia Cain is a 21 y.o. G2P1000 at 9022w0d admitted for induction of labor due to umbilical varix.  Subjective: Pt comfortable, not feeling ctx.   Objective: BP 115/70   Pulse 95   Temp 98.1 F (36.7 C) (Oral)   Resp 16   Ht 5\' 7"  (1.702 m)   Wt 88.1 kg (194 lb 4 oz)   LMP 10/08/2016 (LMP Unknown)   SpO2 100%   BMI 30.42 kg/m  No intake/output data recorded. No intake/output data recorded.  FHT:  FHR: 150s bpm, variability: minimal ,  accelerations:  Abscent,  decelerations:  Absent UC:   irregular, every 2-5 minutes SVE:   Dilation: 3 Effacement (%): 60, 70 Station: -3, -2 Exam by:: Emily GlazierJennifer Hamilton RN Membranes intact  Assessment / Plan: Induction of labor due to umbilical varix  Labor: Not responding to 20mu pitocin; stopped pit and administered buccal cytotec Fetal Wellbeing:  Category II Pain Control:  Labor support without medications I/D:  GBS pos; IV abx intrapartum Anticipated MOD:  NSVD Consult with Dr. Emelda FearFerguson to assess Emily KlinefelterFHT  Emily Cain, SNM 06/05/2017, 7:45 PM

## 2017-06-05 NOTE — Progress Notes (Signed)
Labor Progress Note Emily RuddKenyadia Cain is a 21 y.o. G2P1000 at 4327w0d presented for induction of labor 2/2 to umbilical varix. S: Patient has no complaints. She enjoyed eating dinner.  O:  BP 108/67   Pulse (!) 101   Temp 98.8 F (37.1 C) (Oral)   Resp 16   Ht 5\' 7"  (1.702 m)   Wt 194 lb 4 oz (88.1 kg)   LMP 10/08/2016 (LMP Unknown)   SpO2 100%   BMI 30.42 kg/m  EFM: 150/mod var/no decels  CVE: Dilation: 5 Effacement (%): 70 Cervical Position: Posterior Station: -2 Presentation: Vertex Exam by:: Dr. Rachelle HoraMoss   A&P: 21 y.o. G2P1000 1227w0d here for IOL 2/2 umbilical varix #Labor: Progressing well. AROM @2055 , clear fluid. Plan to restart pitocin at 2230 if baby continues to have cat 1 strip.  #Pain: epidural upon request #FWB: cat 1 #GBS positive   Rolm BookbinderAmber Jazzmyne Rasnick, DO 8:58 PM

## 2017-06-05 NOTE — Anesthesia Pain Management Evaluation Note (Signed)
  CRNA Pain Management Visit Note  Patient: Emily Cain, 21 y.o., female  "Hello I am a member of the anesthesia team at [facility]. We have an anesthesia team available at all times to provide care throughout the hospital, including epidural management and anesthesia for C-section. I don't know your plan for the delivery whether it a natural birth, water birth, IV sedation, nitrous supplementation, doula or epidural, but we want to meet your pain goals."   1.Was your pain managed to your expectations on prior hospitalizations?   Yes   2.What is your expectation for pain management during this hospitalization?     Epidural  3.How can we help you reach that goal?   Record the patient's initial score and the patient's pain goal.   Pain: 6  Pain Goal: 8 The [facility] wants you to be able to say your pain was always managed very well.  Emily Cain,Emily Cain 06/05/2017

## 2017-06-06 ENCOUNTER — Inpatient Hospital Stay (HOSPITAL_COMMUNITY): Payer: Medicaid Other | Admitting: Anesthesiology

## 2017-06-06 ENCOUNTER — Encounter (HOSPITAL_COMMUNITY): Payer: Self-pay

## 2017-06-06 DIAGNOSIS — Z3A37 37 weeks gestation of pregnancy: Secondary | ICD-10-CM

## 2017-06-06 LAB — RPR: RPR Ser Ql: NONREACTIVE

## 2017-06-06 MED ORDER — DIPHENHYDRAMINE HCL 25 MG PO CAPS: 25.0000 mg | ORAL_CAPSULE | Freq: Four times a day (QID) | ORAL | Status: DC | PRN

## 2017-06-06 MED ORDER — BENZOCAINE-MENTHOL 20-0.5 % EX AERO
1.0000 "application " | INHALATION_SPRAY | CUTANEOUS | Status: DC | PRN
  Administered 2017-06-06 – 2017-06-08 (×3): 1 via TOPICAL
  Filled 2017-06-06 (×3): qty 56

## 2017-06-06 MED ORDER — DIBUCAINE 1 % RE OINT: 1.0000 "application " | TOPICAL_OINTMENT | RECTAL | Status: DC | PRN

## 2017-06-06 MED ORDER — ACETAMINOPHEN 325 MG PO TABS
650.0000 mg | ORAL_TABLET | ORAL | Status: DC | PRN
  Administered 2017-06-06 – 2017-06-08 (×5): 650 mg via ORAL
  Filled 2017-06-06 (×6): qty 2

## 2017-06-06 MED ORDER — IBUPROFEN 600 MG PO TABS
600.0000 mg | ORAL_TABLET | Freq: Four times a day (QID) | ORAL | Status: DC
  Administered 2017-06-06 – 2017-06-08 (×10): 600 mg via ORAL
  Filled 2017-06-06 (×10): qty 1

## 2017-06-06 MED ORDER — COCONUT OIL OIL: 1.0000 "application " | TOPICAL_OIL | Status: DC | PRN

## 2017-06-06 MED ORDER — ONDANSETRON HCL 4 MG/2ML IJ SOLN: 4.0000 mg | INTRAMUSCULAR | Status: DC | PRN

## 2017-06-06 MED ORDER — TETANUS-DIPHTH-ACELL PERTUSSIS 5-2.5-18.5 LF-MCG/0.5 IM SUSP: 0.5000 mL | Freq: Once | INTRAMUSCULAR | Status: DC

## 2017-06-06 MED ORDER — LIDOCAINE HCL (PF) 1 % IJ SOLN
INTRAMUSCULAR | Status: DC | PRN
  Administered 2017-06-06 (×2): 6 mL via EPIDURAL

## 2017-06-06 MED ORDER — SIMETHICONE 80 MG PO CHEW: 80.0000 mg | CHEWABLE_TABLET | ORAL | Status: DC | PRN

## 2017-06-06 MED ORDER — SENNOSIDES-DOCUSATE SODIUM 8.6-50 MG PO TABS
2.0000 | ORAL_TABLET | ORAL | Status: DC
  Administered 2017-06-06 – 2017-06-08 (×2): 2 via ORAL
  Filled 2017-06-06 (×2): qty 2

## 2017-06-06 MED ORDER — WITCH HAZEL-GLYCERIN EX PADS
1.0000 "application " | MEDICATED_PAD | CUTANEOUS | Status: DC | PRN
  Administered 2017-06-08: 1 via TOPICAL

## 2017-06-06 MED ORDER — PRENATAL MULTIVITAMIN CH
1.0000 | ORAL_TABLET | Freq: Every day | ORAL | Status: DC
  Administered 2017-06-06 – 2017-06-07 (×2): 1 via ORAL
  Filled 2017-06-06 (×3): qty 1

## 2017-06-06 MED ORDER — ONDANSETRON HCL 4 MG PO TABS: 4.0000 mg | ORAL_TABLET | ORAL | Status: DC | PRN

## 2017-06-06 MED ORDER — ZOLPIDEM TARTRATE 5 MG PO TABS: 5.0000 mg | ORAL_TABLET | Freq: Every evening | ORAL | Status: DC | PRN

## 2017-06-06 NOTE — Anesthesia Postprocedure Evaluation (Signed)
Anesthesia Post Note  Patient: Emily Cain  Procedure(s) Performed: AN AD HOC LABOR EPIDURAL     Patient location during evaluation: Mother Baby Anesthesia Type: Epidural Level of consciousness: awake and alert and oriented Pain management: pain level controlled Vital Signs Assessment: post-procedure vital signs reviewed and stable Respiratory status: spontaneous breathing and nonlabored ventilation Cardiovascular status: stable Postop Assessment: no headache, no apparent nausea or vomiting, no backache, adequate PO intake, epidural receding and patient able to bend at knees Anesthetic complications: no    Last Vitals:  Vitals:   06/06/17 0600 06/06/17 0730  BP: 121/67 120/74  Pulse: 90 92  Resp:  16  Temp: 37.1 C 37.3 C  SpO2: 100%     Last Pain:  Vitals:   06/06/17 0730  TempSrc: Oral  PainSc: 0-No pain   Pain Goal:                 Land O'LakesMalinova,Alnisa Hasley Hristova

## 2017-06-06 NOTE — Anesthesia Procedure Notes (Signed)
Epidural Patient location during procedure: OB Start time: 06/06/2017 12:26 AM End time: 06/06/2017 12:29 AM  Staffing Anesthesiologist: Leilani AbleHATCHETT, Giovan Pinsky Performed: anesthesiologist   Preanesthetic Checklist Completed: patient identified, surgical consent, pre-op evaluation, timeout performed, IV checked, risks and benefits discussed and monitors and equipment checked  Epidural Patient position: sitting Prep: site prepped and draped and DuraPrep Patient monitoring: continuous pulse ox and blood pressure Approach: midline Location: L3-L4 Injection technique: LOR air  Needle:  Needle type: Tuohy  Needle gauge: 17 G Needle length: 9 cm and 9 Needle insertion depth: 5 cm cm Catheter type: closed end flexible Catheter size: 19 Gauge Catheter at skin depth: 10 cm Test dose: negative and Other  Assessment Sensory level: T9 Events: blood not aspirated, injection not painful, no injection resistance, negative IV test and no paresthesia

## 2017-06-06 NOTE — Anesthesia Preprocedure Evaluation (Signed)
Anesthesia Evaluation  Patient identified by MRN, date of birth, ID band Patient awake    Reviewed: Allergy & Precautions, NPO status , Patient's Chart, lab work & pertinent test results  Airway Mallampati: I  TM Distance: >3 FB Neck ROM: Full    Dental no notable dental hx. (+) Teeth Intact   Pulmonary neg pulmonary ROS,    Pulmonary exam normal breath sounds clear to auscultation       Cardiovascular negative cardio ROS Normal cardiovascular exam Rhythm:Regular Rate:Normal     Neuro/Psych negative neurological ROS  negative psych ROS   GI/Hepatic Neg liver ROS,   Endo/Other  negative endocrine ROS  Renal/GU negative Renal ROS  negative genitourinary   Musculoskeletal negative musculoskeletal ROS (+)   Abdominal Normal abdominal exam  (+)   Peds  Hematology  (+) anemia , Thrombocytopenia-mild   Anesthesia Other Findings   Reproductive/Obstetrics (+) Pregnancy IUFD                             Lab Results  Component Value Date   INR 1.0 12/13/2016   INR 1.07 05/22/2016   Lab Results  Component Value Date   WBC 4.9 06/05/2017   HGB 10.6 (L) 06/05/2017   HCT 30.7 (L) 06/05/2017   MCV 80.8 06/05/2017   PLT 152 06/05/2017   No results found for: PTT Lab Results  Component Value Date   DDIMER 2.68 (H) 05/22/2016  . Anesthesia Physical  Anesthesia Plan  ASA: II  Anesthesia Plan: Epidural   Post-op Pain Management:    Induction:   PONV Risk Score and Plan:   Airway Management Planned:   Additional Equipment:   Intra-op Plan:   Post-operative Plan:   Informed Consent: I have reviewed the patients History and Physical, chart, labs and discussed the procedure including the risks, benefits and alternatives for the proposed anesthesia with the patient or authorized representative who has indicated his/her understanding and acceptance.     Plan Discussed with:    Anesthesia Plan Comments:         Anesthesia Quick Evaluation

## 2017-06-06 NOTE — Progress Notes (Signed)
CSW received consult due to score greater than 9, or positive for SI on Edinburgh Depression Screen.    When CSW arrived, MOB was engaging in skin to skin with infant and MOB's was resting in the recliner.  MOB gave CSW permission to meet with MOB while MOB's mother was present. CSW provided education regarding Baby Blues vs PMADs and provided MOB with information about support groups held at [facility].  CSW encouraged MOB to evaluate her mental health throughout the postpartum period with the use of the New Mom Checklist developed by Postpartum Progress and notify a medical professional if symptoms arise.  MOB appeared to have insight and awareness and did not present with any acute MH symptoms.   MOB reported a wealth of support from MOB's immediate family and communicated she felt prepared to parent.   CSW inquired about MOB's fetal demise in 2017 and MOB openly reported that MOB was doing well.  MOB declined outpatient counseling resources and resources for parenting education.  There are no barriers to d/c.  Blaine HamperAngel Boyd-Gilyard, MSW, LCSW Clinical Social Work [phone]

## 2017-06-06 NOTE — Plan of Care (Signed)
Problem: Activity: Goal: Risk for activity intolerance will decrease Outcome: Completed/Met Date Met: 06/06/17 Patient ambulated well to the bathroom. Peri care demonstrated and discussed with patient.  Problem: Nutritional: Goal: Mothers verbalization of comfort with breastfeeding process will improve Patient states she plans to only pump and bottle feed baby. Patient states she does not desire to latch baby at all. Patient states she has her own pump and her family will get it from home to bring to the hospital. Discussed the importance of regular pumping to get milk supply in, hand expression and discussed supply and demand. Provided patient with formula feeding instruction sheet and discussed paced feeding. Patient verbalizes understanding.

## 2017-06-07 MED ORDER — BENZOCAINE-MENTHOL 20-0.5 % EX AERO: 1.0000 "application " | INHALATION_SPRAY | Freq: Four times a day (QID) | CUTANEOUS | Status: DC | PRN

## 2017-06-07 MED ORDER — BENZOCAINE-MENTHOL 20-0.5 % EX AERO: 1.0000 "application " | INHALATION_SPRAY | CUTANEOUS | Status: DC | PRN

## 2017-06-07 NOTE — Lactation Note (Signed)
This note was copied from a baby's chart. Lactation Consultation Note  Patient Name: Girl Kyung RuddKenyadia Basham ZOXWR'UToday's Date: 06/07/2017   Initial LC visit was attempted, but this LC was interrupted & unable to finish consultation (on 10/29@ 1645). RN's note states that she wants to pump & BO. Mom reported that she only had a hand pump at home.    Lurline HareRichey, Dannelle Rhymes [facility] 06/07/2017, 12:02 AM

## 2017-06-07 NOTE — Progress Notes (Signed)
CSW met with MOB on 10/29 and no concerns noted.    There are no barriers to d/c.  Laurey Arrow, MSW, LCSW Clinical Social Work [phone]

## 2017-06-07 NOTE — Progress Notes (Signed)
POSTPARTUM PROGRESS NOTE  Post Partum Day 1  Subjective:  Emily Cain is a 21 y.o. G2P2001 s/p SVD at 33105w1d.  No acute events overnight.  Pt denies problems with ambulating, voiding or po intake.  She denies nausea or vomiting.  Pain is well controlled.  She has had flatus. She has not had bowel movement.  Lochia Minimal.    Objective: Blood pressure 116/70, pulse 80, temperature 98 F (36.7 C), temperature source Oral, resp. rate 18, height 5\' 7"  (1.702 m), weight 194 lb 4 oz (88.1 kg), last menstrual period 10/08/2016, SpO2 100 %, unknown if currently breastfeeding.  Physical Exam:  General: alert, cooperative and no distress Chest: no respiratory distress Heart:regular rate, distal pulses intact Abdomen: soft, nontender,  Uterine Fundus: firm, appropriately tender DVT Evaluation: No calf swelling or tenderness Extremities: no edema Skin: warm, dry  Recent Labs  06/05/17 0820  HGB 10.6*  HCT 30.7*    Assessment/Plan: Emily RuddKenyadia Svec is a 21 y.o. G2P2001 s/p SVD at 26105w1d   PPD#1 - Doing well Contraception: Pt would like to do OCPs she declines nexplanon Feeding: breast Dispo: Plan for discharge tomorrow. Perineal discomfort order dermaplast   LOS: 2 days   Lynnae PrudeKeriann S MinottMD 06/07/2017, 8:49 AM

## 2017-06-07 NOTE — Plan of Care (Signed)
Problem: Nutritional: Goal: Mothers verbalization of comfort with breastfeeding process will improve Outcome: Not Applicable Date Met: 98/02/21 Bottle feeding baby

## 2017-06-07 NOTE — Lactation Note (Signed)
This note was copied from a baby's chart. Lactation Consultation Note  Patient Name: Emily Cain ZOXWR'UToday's Date: 06/07/2017   Attempted to visit with mom, mom sleeping.     Maternal Data    Feeding    LATCH Score                   Interventions    Lactation Tools Discussed/Used     Consult Status      Ed BlalockSharon S Lanecia Sliva 06/07/2017, 2:34 PM

## 2017-06-08 MED ORDER — GABAPENTIN 300 MG PO CAPS
300.0000 mg | ORAL_CAPSULE | Freq: Two times a day (BID) | ORAL | 0 refills | Status: DC
Start: 2017-06-08 — End: 2017-06-08

## 2017-06-08 MED ORDER — IBUPROFEN 600 MG PO TABS: 600.0000 mg | ORAL_TABLET | Freq: Four times a day (QID) | ORAL | 0 refills | Status: DC

## 2017-06-08 MED ORDER — SENNOSIDES-DOCUSATE SODIUM 8.6-50 MG PO TABS: 2.0000 | ORAL_TABLET | ORAL | 0 refills | Status: DC

## 2017-06-08 MED ORDER — GABAPENTIN 300 MG PO CAPS
300.0000 mg | ORAL_CAPSULE | Freq: Two times a day (BID) | ORAL | Status: DC
  Administered 2017-06-08 (×2): 300 mg via ORAL
  Filled 2017-06-08 (×4): qty 1

## 2017-06-08 NOTE — Progress Notes (Signed)
Consulted anesthesia to evaluate patient's new onset of lower back and sciatic like pain. Discharge is pending results of consult.

## 2017-06-08 NOTE — Lactation Note (Signed)
This note was copied from a baby's chart. Lactation Consultation Note:Mothers breast are filling. She is leaking milk. Mother was given a hand pump last night.  She pumped apprx 10 ml from one breast last night.  Lots of discussion on supply and demand and importance of regular pumping.  Mother reports she has a single electric pump at home. Advised mother to do good breast massage to soften tissue.mother taught breast massage. Advised mother to begin pumping with hand pump now and pump for 15 mins on each breast. Mother advised to use ice to prevent severe engorgement. Mother to put on good support bra.  Mother encouraged to continue to pump every 2-3 hours 15 mins on each breast when she arrives home at least 8-10 times in 24 hrs. Mother informed of BFSGS and phone number for breastfeeding questions or concerns.   Patient Name: Emily Cain ZHYQM'VToday's Date: 06/08/2017 Reason for consult: Follow-up assessment   Maternal Data    Feeding    LATCH Score                   Interventions    Lactation Tools Discussed/Used     Consult Status Consult Status: Complete    Emily Cain, Emily Cain 06/08/2017, 11:25 AM

## 2017-06-08 NOTE — Discharge Summary (Signed)
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 yo G2P2001 at 1319w1d was admitted to the hospital 06/05/2017 for induction of labor.  Indication for induction: umbilical varix and history of IUFD at term.  Patient had an uncomplicated labor course as follows: Membrane Rupture Time/Date: 8:51 PM ,06/05/2017   Intrapartum Procedures: Episiotomy: None [1]                                         Lacerations:  Periurethral [8]  Patient had delivery of a Viable infant.  Information for the patient's newborn:  Carney LivingRaziq, Girl Keeli [[id number]][[id number]]  Delivery Method: Vag-Spont  At post partum day 1 at night, patient reported pain down back of thigh that radiates down posterior leg. Sciatic-like distribution. She was given gabapentin 300mg  BID which alleviated the pain. She reported that the pain is random with occurrence.  06/06/2017  Details of delivery can be found in separate delivery note.  Patient had a routine postpartum course. Patient is discharged home 06/08/17.  Physical exam  Vitals:   06/06/17 1900 06/07/17 0529 06/07/17 1709 06/08/17 0555  BP: (!) 106/57 116/70 114/69 (!) 105/55  Pulse: 79 80 81 65  Resp: 20 18 16 18   Temp: 98.9 F (37.2 C) 98 F (36.7 C) 98.6 F (37 C) 98 F (36.7 C)  TempSrc:  Oral Oral Axillary Oral  SpO2: 98% 100% 100% 100%  Weight:      Height:       General: alert, cooperative and no distress Lochia: appropriate Uterine Fundus: firm Incision: N/A DVT Evaluation: No evidence of DVT seen on physical exam.  Labs: Lab Results  Component Value Date   WBC 4.9 06/05/2017   HGB 10.6 (L) 06/05/2017   HCT 30.7 (L) 06/05/2017   MCV 80.8 06/05/2017   PLT 152 06/05/2017   CMP Latest Ref Rng & Units 04/20/2017  Glucose 65 - 99 mg/dL 84  BUN 6 - 20 mg/dL 7  Creatinine 3.660.57 - 4.401.00 mg/dL 3.47(Q0.54(L)  Sodium 259134 - 563144 mmol/L 140  Potassium 3.5 - 5.2 mmol/L 4.2  Chloride 96 - 106 mmol/L 104  CO2 20 - 29 mmol/L 21  Calcium 8.7 - 10.2 mg/dL 8.9  Total Protein 6.0 - 8.5 g/dL 6.4  Total Bilirubin 0.0 - 1.2 mg/dL <8.7<0.2  Alkaline Phos 39 - 117 IU/L 65  AST 0 - 40 IU/L 12  ALT 0 - 32 IU/L 7    Discharge instruction: per After Visit Summary and "Baby and Me Booklet".  After visit meds:  Allergies as of 06/08/2017   No Known Allergies     Medication List    STOP taking these medications   aspirin EC 81 MG tablet   ondansetron 4 MG disintegrating tablet Commonly known as:  ZOFRAN-ODT     TAKE these medications   gabapentin 300 MG capsule Commonly known as:  NEURONTIN Take 1 capsule (300 mg total) by mouth 2 (two) times daily.   ibuprofen 600 MG tablet Commonly known as:  ADVIL,MOTRIN Take 1 tablet (600 mg total) by mouth every 6 (six) hours.   prenatal multivitamin Tabs tablet Take 1 tablet by mouth daily at 12 noon.   senna-docusate 8.6-50 MG tablet Commonly known as:  Senokot-S Take 2 tablets by mouth daily.       Diet: routine diet  Activity: Advance as tolerated. Pelvic rest for 6 weeks.   Outpatient follow up:4 weeks Follow up Appt: Future Appointments Date Time Provider Department Center  07/05/2017 8:40 AM Marny Lowenstein, PA-C [facility]   Follow up Visit:No Follow-up on file.  Postpartum contraception: Progesterone only  pills  Newborn Data: Live born female  Birth Weight: 6 lb 8.1 oz (2950 g) APGAR: 8, 9  Newborn Delivery   Birth date/time:  06/06/2017 03:21:00 Delivery type:  Vaginal, Spontaneous Delivery      Baby Feeding: Breast Disposition:home with mother   06/08/2017 Suella Broad, MD

## 2017-06-08 NOTE — Progress Notes (Signed)
Anesthesiology:    Called to evaluate patient with new onset pain over L lateral upper leg.  She is >48 y hours post partum, having an uncomplicated labor and delivery, with Lumbar Epidural Analgesia. There is mild tenderness at the epidural site, but no edema, erythema, or drainage. This is unchanged over the past 48 hours. She reports that last night she began to have soreness with movement in the upper lateral thigh (IT band region). This was totally relieved with medication last night, but has returned today.  It is relieved with massage.   On PEx: VSS                  Neuro: full sensation is present in Bilat lower extrem, she has full flex/ex strength, and proprioception.  She is able to ambulate freely, without difficulty. She has no paresthesia or dysesthesia, and no femoral nerve or sciatic nerve discomfort.                  Musculoskel: mild swelling bilat ankle    Assessment: Emily Cain's discomfort appears to be related to IlioTibial band stretch. This may be from stirrup positioning, or stretch during the delivery process.  It most likely will resolve with conservative measures and Non-steroidal anti-inflammatory medication.   Emily Cain understands to report any muscular weakness or worsening of neurologic symptoms to her OB, so that we can follow-up if needed.    Sandford Craze Shuayb Schepers, MD

## 2017-06-08 NOTE — Discharge Instructions (Signed)
Home Care Instructions for Mom ACTIVITY  Gradually return to your regular activities.  Let yourself rest. Nap while your baby sleeps.  Avoid lifting anything that is heavier than 10 lb (4.5 kg) until your health care provider says it is okay.  Avoid activities that take a lot of effort and energy (are strenuous) until approved by your health care provider. Walking at a slow-to-moderate pace is usually safe.  If you had a cesarean delivery: ? Do not vacuum, climb stairs, or drive a car for 4-6 weeks. ? Have someone help you at home until you feel like you can do your usual activities yourself. ? Do exercises as told by your health care provider, if this applies.  VAGINAL BLEEDING You may continue to bleed for 4-6 weeks after delivery. Over time, the amount of blood usually decreases and the color of the blood usually gets lighter. However, the flow of bright red blood may increase if you have been too active. If you need to use more than one pad in an hour because your pad gets soaked, or if you pass a large clot:  Lie down.  Raise your feet.  Place a cold compress on your lower abdomen.  Rest.  Call your health care provider.  If you are breastfeeding, your period should return anytime between 8 weeks after delivery and the time that you stop breastfeeding. If you are not breastfeeding, your period should return 6-8 weeks after delivery. PERINEAL CARE The perineal area, or perineum, is the part of your body between your thighs. After delivery, this area needs special care. Follow these instructions as told by your health care provider.  Take warm tub baths for 15-20 minutes.  Use medicated pads and pain-relieving sprays and creams as told.  Do not use tampons or douches until vaginal bleeding has stopped.  Each time you go to the bathroom: ? Use a peri bottle. ? Change your pad. ? Use towelettes in place of toilet paper until your stitches have healed.  Do Kegel  exercises every day. Kegel exercises help to maintain the muscles that support the vagina, bladder, and bowels. You can do these exercises while you are standing, sitting, or lying down. To do Kegel exercises: ? Tighten the muscles of your abdomen and the muscles that surround your birth canal. ? Hold for a few seconds. ? Relax. ? Repeat until you have done this 5 times in a row.  To prevent hemorrhoids from developing or getting worse: ? Drink enough fluid to keep your urine clear or pale yellow. ? Avoid straining when having a bowel movement. ? Take over-the-counter medicines and stool softeners as told by your health care provider.  BREAST CARE  Wear a tight-fitting bra.  Avoid taking over-the-counter pain medicine for breast discomfort.  Apply ice to the breasts to help with discomfort as needed: ? Put ice in a plastic bag. ? Place a towel between your skin and the bag. ? Leave the ice on for 20 minutes or as told by your health care provider.  NUTRITION  Eat a well-balanced diet.  Do not try to lose weight quickly by cutting back on calories.  Take your prenatal vitamins until your postpartum checkup or until your health care provider tells you to stop.  POSTPARTUM DEPRESSION You may find yourself crying for no apparent reason and unable to cope with all of the changes that come with having a newborn. This mood is called postpartum depression. Postpartum depression happens because your hormone  levels change after delivery. If you have postpartum depression, get support from your partner, friends, and family. If the depression does not go away on its own after several weeks, contact your health care provider. BREAST SELF-EXAM Do a breast self-exam each month, at the same time of the month. If you are breastfeeding, check your breasts just after a feeding, when your breasts are less full. If you are breastfeeding and your period has started, check your breasts on day 5, 6, or 7 of  your period. Report any lumps, bumps, or discharge to your health care provider. Know that breasts are normally lumpy if you are breastfeeding. This is temporary, and it is not a health risk. INTIMACY AND SEXUALITY Avoid sexual activity for at least 3-4 weeks after delivery or until the brownish-red vaginal flow is completely gone. If you want to avoid pregnancy, use some form of birth control. You can get pregnant after delivery, even if you have not had your period. SEEK MEDICAL CARE IF:  You feel unable to cope with the changes that a child brings to your life, and these feelings do not go away after several weeks.  You notice a lump, a bump, or discharge on your breast.  SEEK IMMEDIATE MEDICAL CARE IF:  Blood soaks your pad in 1 hour or less.  You have: ? Severe pain or cramping in your lower abdomen. ? A bad-smelling vaginal discharge. ? A fever that is not controlled by medicine. ? A fever, and an area of your breast is red and sore. ? Pain or redness in your calf. ? Sudden, severe chest pain. ? Shortness of breath. ? Painful or bloody urination. ? Problems with your vision.  You vomit for 12 hours or longer.  You develop a severe headache.  You have serious thoughts about hurting yourself, your child, or anyone else.  This information is not intended to replace advice given to you by your health care provider. Make sure you discuss any questions you have with your health care provider. Document Released: 07/23/2000 Document Revised: 01/01/2016 Document Reviewed: 01/27/2015 Elsevier Interactive Patient Education  2017 Else[employer]  Oral Contraception Information Oral contraceptive pills (OCPs) are medicines taken to prevent pregnancy. OCPs work by preventing the ovaries from releasing eggs. The hormones in OCPs also cause the cervical mucus to thicken, preventing the sperm from entering the uterus. The hormones also cause the uterine lining to become thin, not allowing a  fertilized egg to attach to the inside of the uterus. OCPs are highly effective when taken exactly as prescribed. However, OCPs do not prevent sexually transmitted diseases (STDs). Safe sex practices, such as using condoms along with the pill, can help prevent STDs. Before taking the pill, you may have a physical exam and Pap test. Your health care provider may order blood tests. The health care provider will make sure you are a good candidate for oral contraception. Discuss with your health care provider the possible side effects of the OCP you may be prescribed. When starting an OCP, it can take 2 to 3 months for the body to adjust to the changes in hormone levels in your body. Types of oral contraception  The combination pill--This pill contains estrogen and progestin (synthetic progesterone) hormones. The combination pill comes in 21-day, 28-day, or 91-day packs. Some types of combination pills are meant to be taken continuously (365-day pills). With 21-day packs, you do not take pills for 7 days after the last pill. With 28-day packs, the pill  is taken every day. The last 7 pills are without hormones. Certain types of pills have more than 21 hormone-containing pills. With 91-day packs, the first 84 pills contain both hormones, and the last 7 pills contain no hormones or contain estrogen only.  The minipill--This pill contains the progesterone hormone only. The pill is taken every day continuously. It is very important to take the pill at the same time each day. The minipill comes in packs of 28 pills. All 28 pills contain the hormone. Advantages of oral contraceptive pills  Decreases premenstrual symptoms.  Treats menstrual period cramps.  Regulates the menstrual cycle.  Decreases a heavy menstrual flow.  May treatacne, depending on the type of pill.  Treats abnormal uterine bleeding.  Treats polycystic ovarian syndrome.  Treats endometriosis.  Can be used as emergency  contraception. Things that can make oral contraceptive pills less effective OCPs can be less effective if:  You forget to take the pill at the same time every day.  You have a stomach or intestinal disease that lessens the absorption of the pill.  You take OCPs with other medicines that make OCPs less effective, such as antibiotics, certain HIV medicines, and some seizure medicines.  You take expired OCPs.  You forget to restart the pill on day 7, when using the packs of 21 pills.  Risks associated with oral contraceptive pills Oral contraceptive pills can sometimes cause side effects, such as:  Headache.  Nausea.  Breast tenderness.  Irregular bleeding or spotting.  Combination pills are also associated with a small increased risk of:  Blood clots.  Heart attack.  Stroke.  This information is not intended to replace advice given to you by your health care provider. Make sure you discuss any questions you have with your health care provider. Document Released: 10/16/2002 Document Revised: 01/01/2016 Document Reviewed: 01/14/2013 Elsevier Interactive Patient Education  [employer].

## 2017-06-30 ENCOUNTER — Other Ambulatory Visit: Payer: Self-pay

## 2017-06-30 ENCOUNTER — Emergency Department (HOSPITAL_COMMUNITY)
Admission: EM | Admit: 2017-06-30 | Discharge: 2017-07-01 | Disposition: A | Discharge status: Home / Self Care | Payer: Medicaid Other | Attending: Emergency Medicine | Admitting: Emergency Medicine

## 2017-06-30 ENCOUNTER — Encounter (HOSPITAL_COMMUNITY): Payer: Self-pay | Admitting: Emergency Medicine

## 2017-06-30 DIAGNOSIS — N644 Mastodynia: Secondary | ICD-10-CM | POA: Diagnosis not present

## 2017-06-30 DIAGNOSIS — Z5321 Procedure and treatment not carried out due to patient leaving prior to being seen by health care provider: Secondary | ICD-10-CM | POA: Diagnosis not present

## 2017-06-30 NOTE — ED Triage Notes (Signed)
Pt states she had a babe 3 weeks ago she was trying to breast feed the baby now she has bilateral nipple pain and itchiness and she doesn't feel like she is able to breast feed any more 7/10 pain at this time, no fever or chills.

## 2017-07-01 NOTE — ED Notes (Signed)
No answer x2 for treatment room. 

## 2017-07-05 ENCOUNTER — Ambulatory Visit: Payer: Self-pay | Admitting: Medical

## 2017-07-05 ENCOUNTER — Ambulatory Visit (INDEPENDENT_AMBULATORY_CARE_PROVIDER_SITE_OTHER): Payer: Medicaid Other | Admitting: Clinical

## 2017-07-05 ENCOUNTER — Ambulatory Visit (INDEPENDENT_AMBULATORY_CARE_PROVIDER_SITE_OTHER): Payer: Medicaid Other | Admitting: Medical

## 2017-07-05 ENCOUNTER — Encounter: Payer: Self-pay | Admitting: Medical

## 2017-07-05 VITALS — BP 117/75 | HR 67 | Ht 67.0 in | Wt 175.1 lb

## 2017-07-05 DIAGNOSIS — F53 Postpartum depression: Secondary | ICD-10-CM

## 2017-07-05 DIAGNOSIS — Z1389 Encounter for screening for other disorder: Secondary | ICD-10-CM

## 2017-07-05 DIAGNOSIS — K5900 Constipation, unspecified: Secondary | ICD-10-CM | POA: Diagnosis not present

## 2017-07-05 DIAGNOSIS — F322 Major depressive disorder, single episode, severe without psychotic features: Secondary | ICD-10-CM | POA: Diagnosis not present

## 2017-07-05 DIAGNOSIS — B3789 Other sites of candidiasis: Secondary | ICD-10-CM

## 2017-07-05 DIAGNOSIS — O99345 Other mental disorders complicating the puerperium: Secondary | ICD-10-CM

## 2017-07-05 MED ORDER — SERTRALINE HCL 25 MG PO TABS: 25.0000 mg | ORAL_TABLET | Freq: Every day | ORAL | 1 refills | Status: DC

## 2017-07-05 MED ORDER — NYSTATIN 100000 UNIT/GM EX CREA: TOPICAL_CREAM | CUTANEOUS | 0 refills | Status: DC

## 2017-07-05 MED ORDER — POLYETHYLENE GLYCOL 3350 17 GM/SCOOP PO POWD: 1.0000 | Freq: Once | ORAL | 0 refills | Status: AC

## 2017-07-05 MED ORDER — DOCUSATE SODIUM 250 MG PO CAPS: 250.0000 mg | ORAL_CAPSULE | Freq: Every day | ORAL | 0 refills | Status: DC

## 2017-07-05 NOTE — Patient Instructions (Signed)

## 2017-07-05 NOTE — Addendum Note (Signed)
Addended by: Marny LowensteinWENZEL, JULIE N on: 07/05/2017 04:25 PM   Modules accepted: Orders

## 2017-07-05 NOTE — Progress Notes (Signed)
Subjective:     Emily Cain is a 21 y.o. female who presents for a postpartum visit. She is 4 weeks postpartum following a spontaneous vaginal delivery. I have fully reviewed the prenatal and intrapartum course. The delivery was at 36 gestational weeks. Outcome: spontaneous vaginal delivery. Anesthesia: epidural. Postpartum course has been depression. Baby's course has been normal. Baby is feeding by bottle - Similac Sensitive RS. Breast fed x 1 week. Bleeding moderate lochia and red. Bowel function is abnormal: no bowel movement in two weeks . Bladder function is normal. Patient is not sexually active. Contraception method is abstinence. Postpartum depression screening: positive.  The following portions of the patient's history were reviewed and updated as appropriate: allergies, current medications, past family history, past medical history, past social history, past surgical history and problem list.  Review of Systems Pertinent items are noted in HPI.   Objective:    BP 117/75   Pulse 67   Ht 5\' 7"  (1.702 m)   Wt 175 lb 1.6 oz (79.4 kg)   LMP 06/06/2017   BMI 27.42 kg/m   General:  alert, cooperative and fatigued   Breasts:  positive findings: dryness of the nipple with mild erythema bilaterally  Lungs: clear to auscultation bilaterally  Heart:  regular rate and rhythm, S1, S2 normal, no murmur, click, rub or gallop  Abdomen: soft, non-tender; bowel sounds normal; no masses,  no organomegaly   Vulva:  not evaluated  Vagina: not evaluated  Cervix:  not evaluated  Corpus: not examined  Adnexa:  not evaluated  Rectal Exam: Not performed.        Assessment:   Normal postpartum exam. Pap smear not done at today's visit.  patient will need pap smear at [age] years old PP Depression  Candidiasis of the breast Constipation   Plan:    1. Contraception: abstinence or condom use until decides on birth control  2. Patient to [facility]Jamie [facility] 3. Rx for Nystatin,  Colace and Miralax sent to patient's pharmacy  4.  Follow up in: for birth control initiation when she has decided on birth control or sooner as needed.    Emily Cain, Emily N, PA-C 07/05/2017 2:47 PM

## 2017-07-05 NOTE — Progress Notes (Signed)
[facility] I agree with this given the patient's current state. Rx for Zoloft sent to patient's pharmacy. She [facility] and plans to follow[facility].   Marny LowensteinWenzel, Julie N, PA-C 07/05/2017 4:25 PM

## 2017-07-05 NOTE — BH Specialist Note (Signed)
  Integrated Behavioral Health Follow Up Visit  MRN: [id number] Name: Emily Cain  Number of Integrated Behavioral Health Clinician visits: 3/6 Session Start time: 3:20 Session End time: 3:43 Total time: 20 minutes  Type of Service: Integrated Behavioral Health- Individual/Family Interpretor:No. Interpretor Name and Language: n/a  SUBJECTIVE: Emily RuddKenyadia Tooley is a 21 y.o. female accompanied by n/a Patient was referred by Vonzella NippleJulie Wenzel, PA-C for symptoms of anxiety and depression. Patient reports the following symptoms/concerns: Pt states her primary concern today is overwhelming fatigue, depression, lack of appetite, anhedonia, and "feeling stuck", and "sleeping all day". Pt is open to [facility] today. Pt does not wish to consider grief support at this time. Duration of problem: Posptartum; Severity of problem: severe  OBJECTIVE: Mood: Depressed and Affect: Depressed and Tearful Risk of harm to self or others: No plan to harm self or others  LIFE CONTEXT: Family and Social: Lives with mother and newborn daughter; mother and sister are very supportive School/Work: not at this time Self-Care: Forgets to eat, sleeping all day, no known substances Life Changes: Recent childbirth  GOALS ADDRESSED: Patient will: 1.  Reduce symptoms of: anxiety and depression  2.  Increase knowledge and/or ability of: coping skills  3.  Demonstrate ability to: Increase healthy adjustment to current life circumstances and Begin healthy grieving over loss  INTERVENTIONS: Interventions utilized:  Mining engineerBehavioral Activation and Psychoeducation and/or Health Education Standardized Assessments completed: GAD-7 and PHQ 9  ASSESSMENT: Patient currently experiencing Adjustment disorder with mixed anxious and depressed mood and Grief.   Patient may benefit from supportive counseling and appropriate community resources.  PLAN: 1. Follow up with behavioral health clinician on : two days via phone; one  week office visit 2. Behavioral recommendations:  -Pick up BH medication today, and take as prescibed by medical provider -Answer phone on Thursday, 07-07-17 [facility] 3. Referral(s): [facility] ([facility] Clinic) and Peer support and Group grief support 4. "From scale of 1-10, how likely are you to follow plan?": 7  Rae LipsJamie C Lucinda Spells, LCSW     Depression screen [facility] 2/9 07/05/2017 05/04/2017 04/20/2017 04/05/2017 03/24/2017  Decreased Interest 2 0 0 0 0  Down, Depressed, Hopeless 3 0 0 0 0  PHQ - 2 Score 5 0 0 0 0  Altered sleeping 3 0 0 0 0  Tired, decreased energy 3 0 0 0 0  Change in appetite 1 0 0 0 0  Feeling bad or failure about yourself  3 0 0 0 0  Trouble concentrating 1 0 0 0 0  Moving slowly or fidgety/restless 0 0 0 0 0  Suicidal thoughts 0 0 - 0 0  PHQ-9 Score 16 0 0 0 0  Some recent data might be hidden   GAD 7 : Generalized Anxiety Score 07/05/2017 05/04/2017 04/20/2017 04/05/2017  Nervous, Anxious, on Edge 3 0 0 0  Control/stop worrying 3 0 0 0  Worry too much - different things 3 0 0 0  Trouble relaxing 1 0 0 0  Restless 0 0 0 0  Easily annoyed or irritable 3 0 0 0  Afraid - awful might happen 0 0 0 0  Total GAD 7 Score 13 0 0 0  Anxiety Difficulty - - - -

## 2017-07-06 MED ORDER — NYSTATIN 100000 UNIT/GM EX CREA: TOPICAL_CREAM | CUTANEOUS | 0 refills | Status: DC

## 2017-07-06 NOTE — Addendum Note (Signed)
Addended by: Kathee DeltonHILLMAN, Sanjiv Castorena L on: 07/06/2017 11:23 AM   Modules accepted: Orders

## 2017-07-08 ENCOUNTER — Telehealth: Payer: Self-pay | Admitting: Clinical

## 2017-07-08 ENCOUNTER — Ambulatory Visit: Payer: Self-pay | Admitting: Medical

## 2017-07-08 NOTE — Telephone Encounter (Signed)
Integrated Behavioral Health Medication Management Phone Note  MRN: [id number] NAME: Emily Cain  Time Call Initiated: 10:32 Time Call Completed: 10:34 Total Call Time: 2 minutes  Current Medications:  Outpatient Medications Prior to Visit  Medication Sig Dispense Refill  . docusate sodium (COLACE) 250 MG capsule Take 1 capsule (250 mg total) by mouth daily. 10 capsule 0  . ibuprofen (ADVIL,MOTRIN) 600 MG tablet Take 1 tablet (600 mg total) by mouth every 6 (six) hours. (Patient not taking: Reported on 07/05/2017) 30 tablet 0  . nystatin cream (MYCOSTATIN) Apply to affected area 2 times daily 15 g 0  . Prenatal Vit-Fe Fumarate-FA (PRENATAL MULTIVITAMIN) TABS tablet Take 1 tablet by mouth daily at 12 noon.    . senna-docusate (SENOKOT-S) 8.6-50 MG tablet Take 2 tablets by mouth daily. (Patient not taking: Reported on 07/05/2017) 60 tablet 0  . sertraline (ZOLOFT) 25 MG tablet Take 1 tablet (25 mg total) by mouth daily. 30 tablet 1   No facility-administered medications prior to visit.     Patient has been able to get all medications filled as prescribed: Yes  Patient is currently taking all medications as prescribed: Yes  Patient reports experiencing side effects: Yes- Increase in "trips to the bathroom", but not problematic  Patient describes feeling this way on medications: No difference yet  Additional patient concerns: no  Patient advised to schedule appointment with provider for evaluation of medication side effects or additional concerns: No   Valetta CloseJamie C Kelly Eisler, LCSW

## 2017-07-12 ENCOUNTER — Ambulatory Visit: Payer: Self-pay

## 2017-07-13 ENCOUNTER — Telehealth: Payer: Self-pay | Admitting: Clinical

## 2017-07-14 NOTE — Telephone Encounter (Signed)
Follow-up after no-show on 07-13-17. Pt states that she is starting to feel a little better, symptoms of depression are lifting, and no side effects from Zoloft.   Pt is reminded that she has an appointment with her PCP in six days, that she has only been [facility], and that she can talk to her PCP about continuing Zoloft, since it seems to be improving her mood, with no side effects.   It is recommended for her to come back at least one more time to see [facility], to discuss additional self-coping strategies for symptoms of depression. Pt is uncertain when she will return, but pt understands, and will call back [phone] to make any additional appointments.

## 2017-07-24 ENCOUNTER — Encounter (HOSPITAL_COMMUNITY): Payer: Self-pay | Admitting: Emergency Medicine

## 2017-07-24 ENCOUNTER — Other Ambulatory Visit: Payer: Self-pay

## 2017-07-24 ENCOUNTER — Emergency Department (HOSPITAL_COMMUNITY)
Admission: EM | Admit: 2017-07-24 | Discharge: 2017-07-25 | Disposition: A | Discharge status: Home / Self Care | Payer: Medicaid Other | Attending: Emergency Medicine | Admitting: Emergency Medicine

## 2017-07-24 DIAGNOSIS — Z79899 Other long term (current) drug therapy: Secondary | ICD-10-CM | POA: Insufficient documentation

## 2017-07-24 DIAGNOSIS — R21 Rash and other nonspecific skin eruption: Secondary | ICD-10-CM | POA: Diagnosis not present

## 2017-07-24 MED ORDER — LORATADINE 10 MG PO TABS
10.0000 mg | ORAL_TABLET | Freq: Every day | ORAL | Status: DC
  Administered 2017-07-25: 10 mg via ORAL
  Filled 2017-07-24: qty 1

## 2017-07-24 MED ORDER — DIPHENHYDRAMINE HCL 25 MG PO TABS: 25.0000 mg | ORAL_TABLET | Freq: Four times a day (QID) | ORAL | 0 refills | Status: DC

## 2017-07-24 MED ORDER — TRIAMCINOLONE ACETONIDE 0.1 % EX CREA: 1.0000 "application " | TOPICAL_CREAM | Freq: Two times a day (BID) | CUTANEOUS | 0 refills | Status: DC

## 2017-07-24 MED ORDER — FAMOTIDINE 20 MG PO TABS: 20.0000 mg | ORAL_TABLET | Freq: Every day | ORAL | 0 refills | Status: DC

## 2017-07-24 MED ORDER — FAMOTIDINE 20 MG PO TABS
20.0000 mg | ORAL_TABLET | Freq: Once | ORAL | Status: AC
  Administered 2017-07-25: 20 mg via ORAL
  Filled 2017-07-24: qty 1

## 2017-07-24 NOTE — Discharge Instructions (Signed)
As discussed, take antihistamine for itching and apply cream to affected area twice daily as needed.  Follow up with your primary care provider at your upcoming appointment. Return if symptoms worsen, swelling, difficulty breathing, fever, chills or any other new concerning symptoms in the meantime.

## 2017-07-24 NOTE — ED Provider Notes (Signed)
[facility] Provider Note   CSN: [id number] Arrival date & time: 07/24/17  2208     History   Chief Complaint Chief Complaint  Patient presents with  . Rash    HPI Emily Cain is a 21 y.o. female with past  history significant for environmental allergies presenting with a sudden onset pruritic papular rash to her left neck and chest. Mom in the room reports that they have been using a new fabric softener at the house but she is unsure if she has actually been wearing any clothes with the new product.  Her infant is with her with a papular rash to her face and neck being evaluated in pediatrics ED. She states that she has had a similar rash in the past while pregnant but has had resolved on its own.  She has not tried anything for the symptoms at home.  No chest pain, shortness of breath, swelling or other symptoms. She is not breast-feeding.  HPI  Past Medical History:  Diagnosis Date  . Anemia   . Environmental allergies   . History of IUFD     Patient Active Problem List   Diagnosis Date Noted  . 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 NO PAST SURGERIES      OB History    Gravida Para Term Preterm AB Living   2 2 2  0 0 1   SAB TAB Ectopic Multiple Live Births   0 0 0 0 1       Home Medications    Prior to Admission medications   Medication Sig Start Date End Date Taking? Authorizing Provider  diphenhydrAMINE (BENADRYL) 25 MG tablet Take 1 tablet (25 mg total) by mouth every 6 (six) hours. 07/24/17   Mathews RobinsonsMitchell, Hildred Pharo B, PA-C  docusate sodium (COLACE) 250 MG capsule Take 1 capsule (250 mg total) by mouth daily. 07/05/17   Marny LowensteinWenzel, Julie N, PA-C  famotidine (PEPCID) 20 MG tablet Take 1 tablet (20 mg total) by mouth daily. 07/24/17   Georgiana ShoreMitchell, Cashmere Harmes B, PA-C  ibuprofen (ADVIL,MOTRIN) 600 MG tablet Take 1  tablet (600 mg total) by mouth every 6 (six) hours. Patient not taking: Reported on 07/05/2017 06/08/17   Minott, Lynnae PrudeKeriann S, MD  nystatin cream (MYCOSTATIN) Apply to affected area 2 times daily 07/06/17   Marny LowensteinWenzel, Julie N, PA-C  Prenatal Vit-Fe Fumarate-FA (PRENATAL MULTIVITAMIN) TABS tablet Take 1 tablet by mouth daily at 12 noon.    [provider]  senna-docusate (SENOKOT-S) 8.6-50 MG tablet Take 2 tablets by mouth daily. Patient not taking: Reported on 07/05/2017 06/09/17   Minott, Lynnae PrudeKeriann S, MD  sertraline (ZOLOFT) 25 MG tablet Take 1 tablet (25 mg total) by mouth daily. 07/05/17   Marny LowensteinWenzel, Julie N, PA-C  triamcinolone cream (KENALOG) 0.1 % Apply 1 application topically 2 (two) times daily. 07/24/17   Georgiana ShoreMitchell, Deajah Erkkila B, PA-C    Family History Family History  Problem Relation Age of Onset  . Cancer Maternal Aunt   . Cancer Maternal Uncle   . Cancer Maternal Grandmother     Social History Social History   Tobacco Use  . Smoking status: Never Smoker  . Smokeless tobacco: Never Used  Substance Use Topics  . Alcohol use: No  . Drug use: No     Allergies   Patient has no known allergies.   Review of Systems Review  of Systems  Constitutional: Negative for chills and fever.  HENT: Negative for congestion, ear pain, sore throat, trouble swallowing and voice change.   Eyes: Negative for pain, discharge, redness and itching.  Respiratory: Negative for cough, choking, chest tightness, shortness of breath, wheezing and stridor.   Cardiovascular: Negative for chest pain and palpitations.  Gastrointestinal: Negative for abdominal pain, nausea and vomiting.  Musculoskeletal: Negative for arthralgias, back pain, myalgias, neck pain and neck stiffness.  Skin: Positive for rash. Negative for color change and pallor.       Pruritic rash  Neurological: Negative for dizziness.     Physical Exam Updated Vital Signs BP 115/66 (BP Location: Right Arm)   Pulse 89   Temp 98 F  (36.7 C) (Oral)   Resp 16   Ht 5\' 7"  (1.702 m)   Wt 78.9 kg (174 lb)   SpO2 100%   BMI 27.25 kg/m   Physical Exam  Constitutional: She appears well-developed and well-nourished. No distress.  Afebrile, nontoxic appearing, sitting comfortably in chair in no acute distress.  HENT:  Head: Normocephalic and atraumatic.  Eyes: Conjunctivae are normal.  Neck: Normal range of motion. Neck supple.  Cardiovascular: Normal rate, regular rhythm and normal heart sounds.  No murmur heard. Pulmonary/Chest: Effort normal and breath sounds normal. No stridor. No respiratory distress. She has no wheezes. She has no rales. She exhibits no tenderness.  Musculoskeletal: Normal range of motion. She exhibits no edema.  Neurological: She is alert.  Skin: Skin is warm and dry. Rash noted. She is not diaphoretic. No erythema. No pallor.  Small papular rash, sandpaper like patch to the left neck and chest over the right breast.   Psychiatric: She has a normal mood and affect.  Nursing note and vitals reviewed.    ED Treatments / Results  Labs (all labs ordered are listed, but only abnormal results are displayed) Labs Reviewed - No data to display  EKG  EKG Interpretation None       Radiology No results found.  Procedures Procedures (including critical care time)  Medications Ordered in ED Medications  loratadine (CLARITIN) tablet 10 mg (not administered)  famotidine (PEPCID) tablet 20 mg (not administered)     Initial Impression / Assessment and Plan / ED Course  I have reviewed the triage vital signs and the nursing notes.  Pertinent labs & imaging results that were available during my care of the patient were reviewed by me and considered in my medical decision making (see chart for details).    Patient presenting with sudden onset pruritic rash starting today. Possible exposure to new fabric softener. Infant with same symptoms.  Patient has had this in the past and it resolved  on its own.  Patient was given antihistamines while in the emergency department.  Lungs CTA bilaterally.  With normal vital signs and stable.  She is otherwise feeling well and well-appearing.  Discharge home with symptomatic relief and close follow-up with PCP.  She has an upcoming appointment.  Discussed strict return precautions and advised to return to the emergency department if experiencing any new or worsening symptoms. Instructions were understood and patient agreed with discharge plan.  Final Clinical Impressions(s) / ED Diagnoses   Final diagnoses:  Rash    ED Discharge Orders        Ordered    triamcinolone cream (KENALOG) 0.1 %  2 times daily     07/24/17 2345    diphenhydrAMINE (BENADRYL) 25 MG tablet  Every 6  hours     07/24/17 2345    famotidine (PEPCID) 20 MG tablet  Daily     07/24/17 2345       Gregary CromerMitchell, Damion Kant B, PA-C 07/24/17 2359    Marily MemosMesner, Jason, MD 07/25/17 [phone]

## 2017-07-24 NOTE — ED Triage Notes (Signed)
Pt reports neck rash that started today.  The rash is itchy. Her month old baby is being see for the same thing.

## 2017-07-24 NOTE — ED Notes (Addendum)
Pt is with child getting triaged. Will get pt when she is done getting child checked out.

## 2017-09-20 IMAGING — US US OB COMP LESS 14 WK
1 series · 15 of 28 positions shown · non-contrast
Comparison: None.

CLINICAL DATA: Unknown LMP.  High risk pregnancy.

EXAM:
OBSTETRIC <14 WK ULTRASOUND
TECHNIQUE: Transabdominal ultrasound was performed for evaluation of the
gestation as well as the maternal uterus and adnexal regions.

[Series 1: us ob comp less 14 wk · 35 acquisitions, 15 frames shown]
[im 1/35]
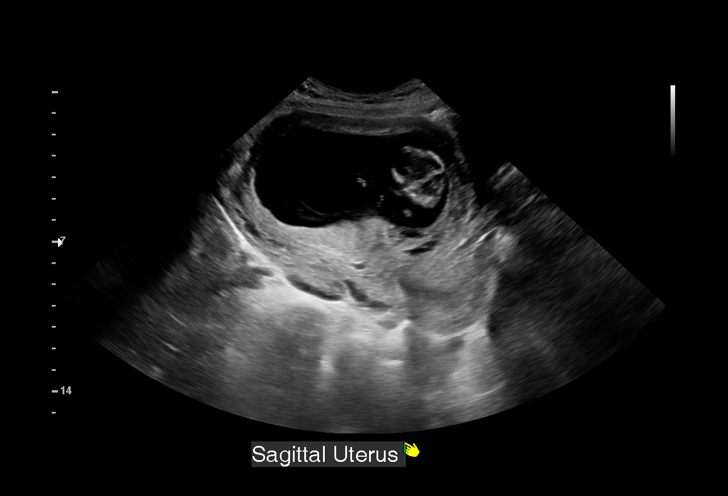
[im 3/35]
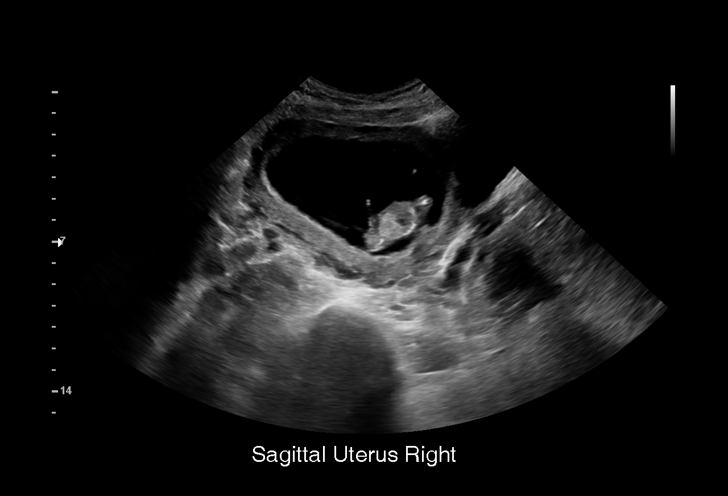
[im 6/35]
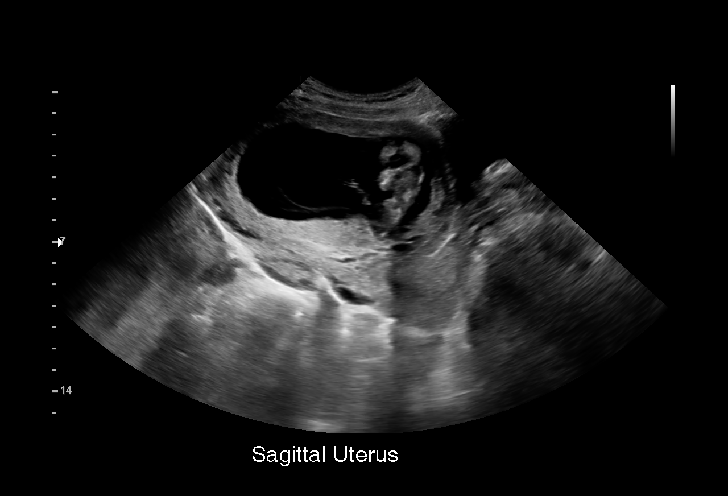
[im 8/35]
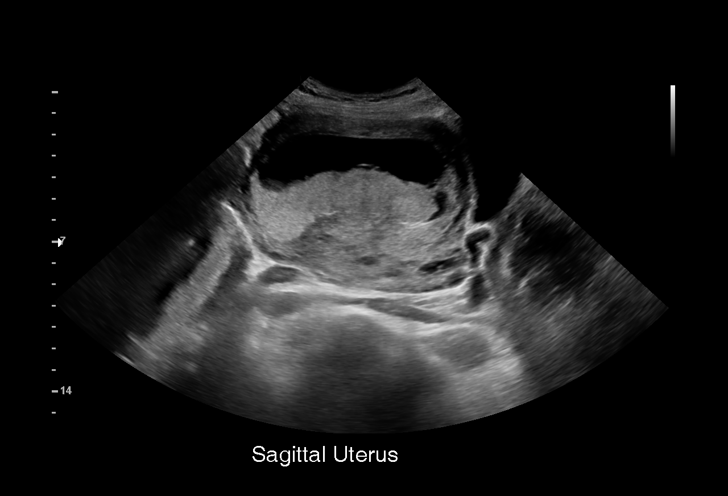
[im 11/35]
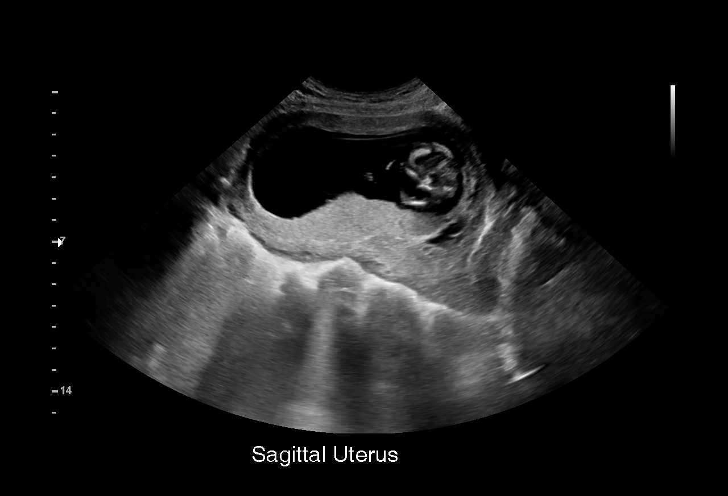
[im 13/35]
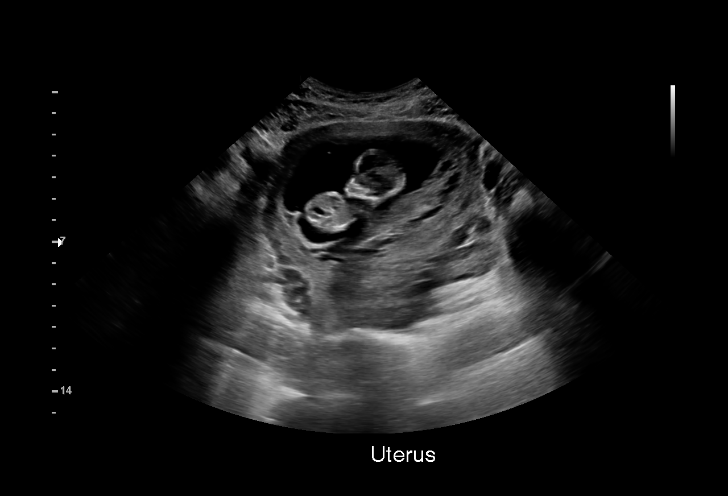
[im 16/35]
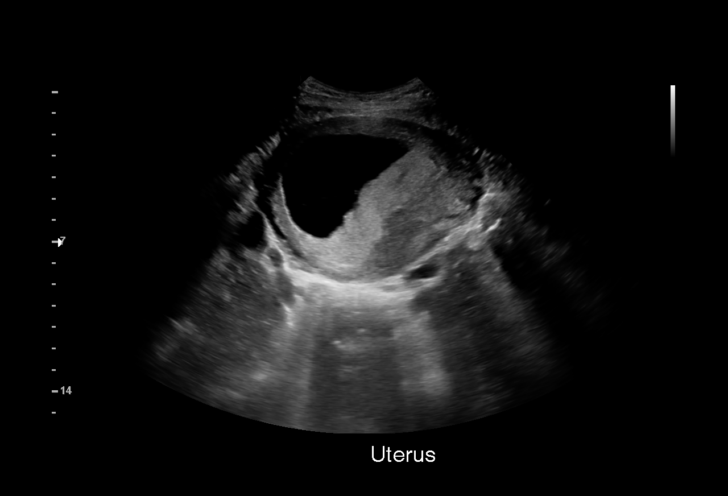
[im 18/35]
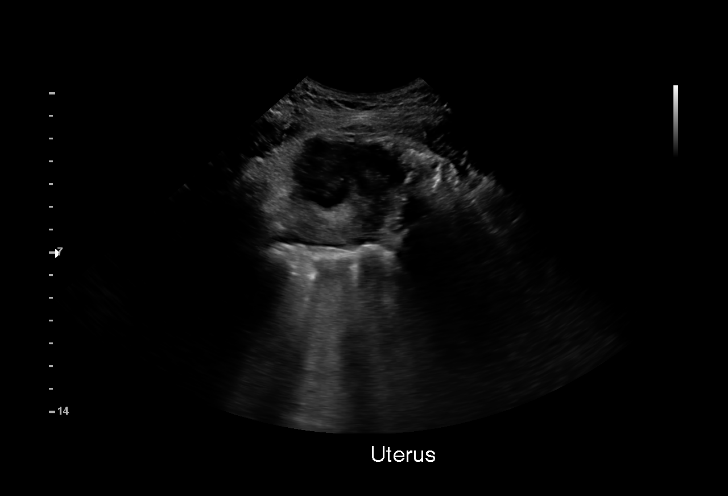
[im 19/35]
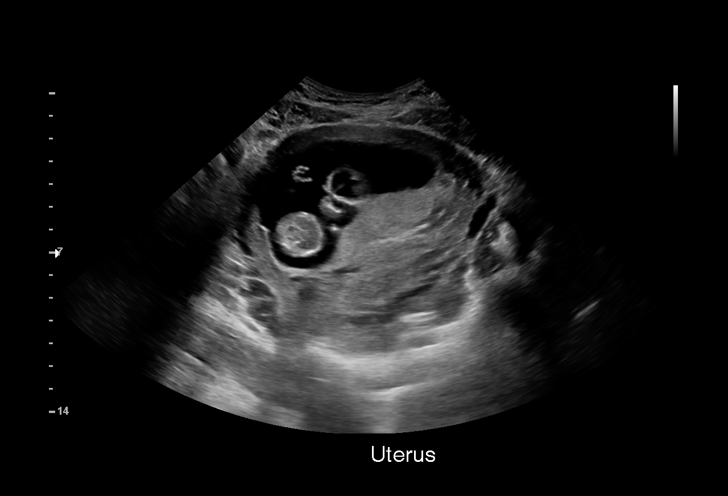
[im 22/35]
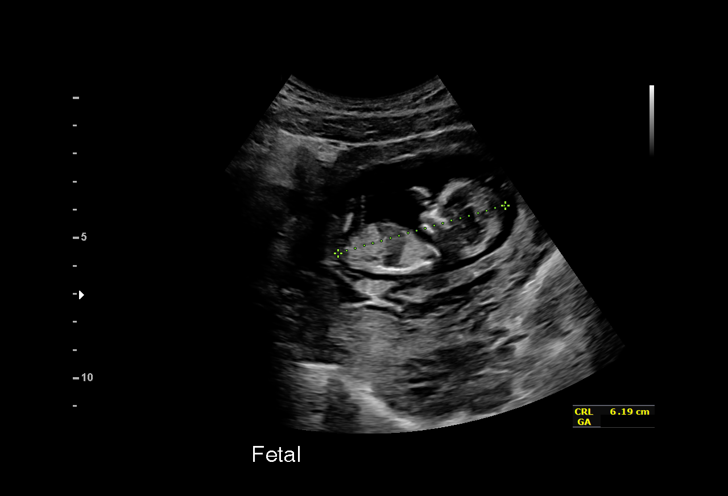
[im 24/35]
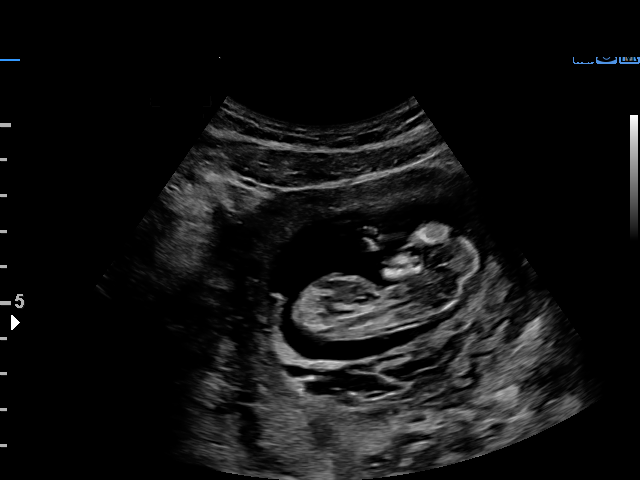
[im 27/35]
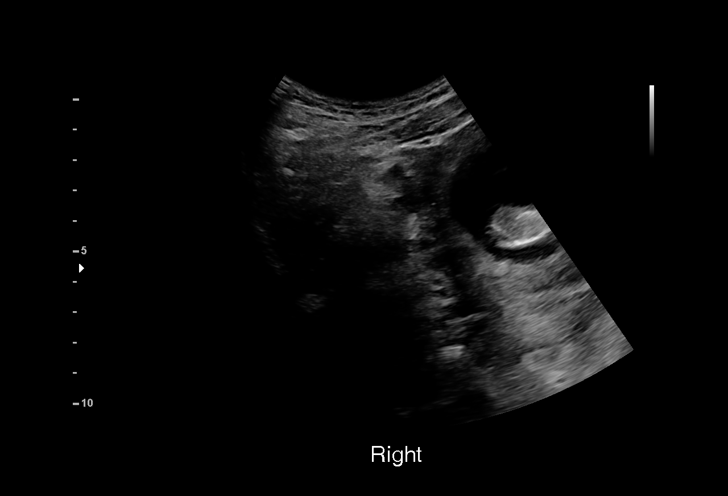
[im 29/35]
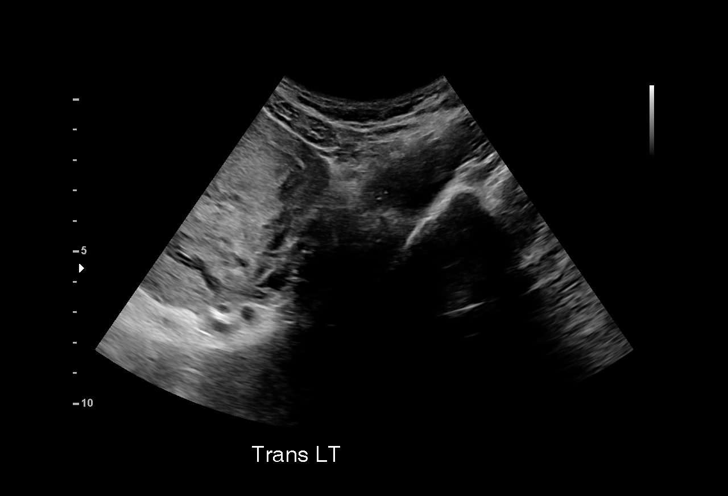
[im 32/35]
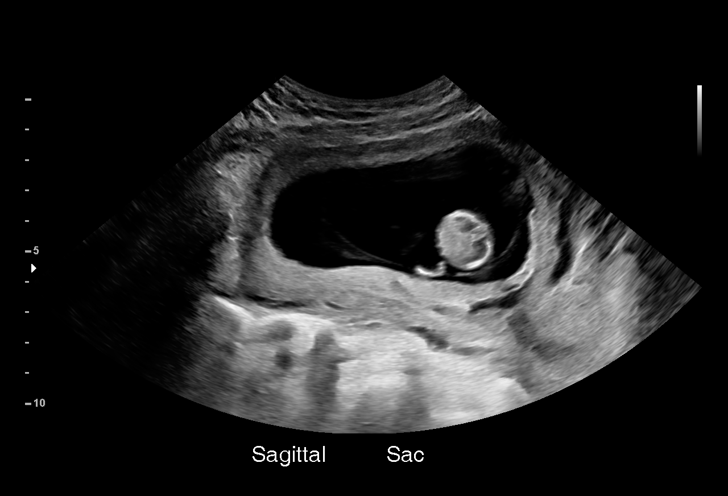
[im 35/35]
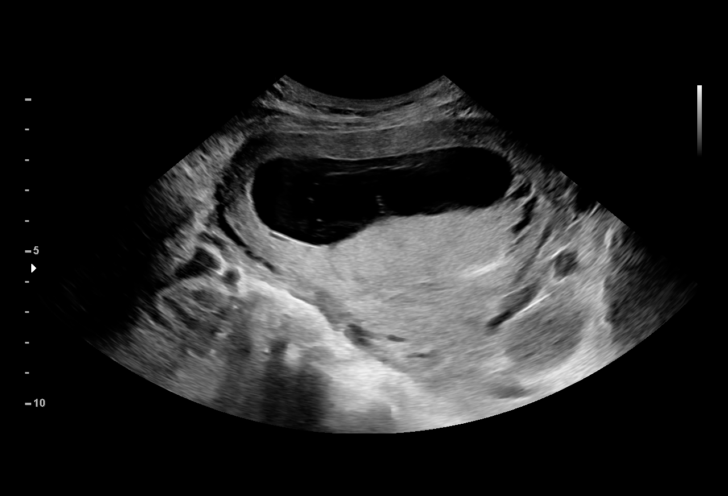

[15 of 28 positions shown; findings below may reference images not displayed]

FINDINGS: Intrauterine gestational sac: Single

Yolk sac:  Not Visualized.

Embryo:  Visualized.

Cardiac Activity: Visualized.

Heart Rate: 150 bpm

CRL:   61  mm   12 w 4 d                  US EDC: 06/26/2017

Subchorionic hemorrhage:  None visualized.

Maternal uterus/adnexae: Ovaries not directly visualized, however no
mass or free fluid identified.
IMPRESSION: Single living IUP measuring 12 weeks 4 days, with US EDC of
06/26/2017.

## 2017-11-14 ENCOUNTER — Encounter: Payer: Self-pay | Admitting: *Deleted

## 2017-12-07 ENCOUNTER — Encounter (HOSPITAL_COMMUNITY): Payer: Self-pay

## 2017-12-07 ENCOUNTER — Inpatient Hospital Stay (HOSPITAL_COMMUNITY)
Admission: AD | Admit: 2017-12-07 | Discharge: 2017-12-07 | Disposition: A | Discharge status: Home / Self Care | Payer: Medicaid Other | Source: Ambulatory Visit | Attending: Obstetrics and Gynecology | Admitting: Obstetrics and Gynecology

## 2017-12-07 DIAGNOSIS — Z3202 Encounter for pregnancy test, result negative: Secondary | ICD-10-CM | POA: Insufficient documentation

## 2017-12-07 DIAGNOSIS — N926 Irregular menstruation, unspecified: Secondary | ICD-10-CM | POA: Insufficient documentation

## 2017-12-07 LAB — URINALYSIS, ROUTINE W REFLEX MICROSCOPIC
Bacteria, UA: NONE SEEN
Bilirubin Urine: NEGATIVE
GLUCOSE, UA: NEGATIVE mg/dL
KETONES UR: NEGATIVE mg/dL
LEUKOCYTES UA: NEGATIVE
Nitrite: NEGATIVE
PH: 6 (ref 5.0–8.0)
Protein, ur: NEGATIVE mg/dL
RBC / HPF: >50 RBC/hpf — ABNORMAL HIGH (ref 0–5)
Specific Gravity, Urine: 1.018 (ref 1.005–1.030)

## 2017-12-07 LAB — WET PREP, GENITAL
Clue Cells Wet Prep HPF POC: NONE SEEN
SPERM: NONE SEEN
TRICH WET PREP: NONE SEEN
Yeast Wet Prep HPF POC: NONE SEEN

## 2017-12-07 LAB — POCT PREGNANCY, URINE: Preg Test, Ur: NEGATIVE

## 2017-12-07 MED ORDER — IBUPROFEN 600 MG PO TABS: 600.0000 mg | ORAL_TABLET | Freq: Four times a day (QID) | ORAL | 0 refills | Status: DC | PRN

## 2017-12-07 NOTE — MAU Note (Signed)
Had TAB 10/09/17. Still bleeding, saturating peri pad every 1-2 hours.  Some bleeding is "orange" per patient.  Cramping 7/10.  Did not follow up after procedure.   Denies discharge; fever.

## 2017-12-07 NOTE — MAU Provider Note (Addendum)
History     CSN: [id number]  Arrival date and time: 12/07/17 0007  Provider's Initial contact with patient at: 0154     Chief Complaint  Patient presents with  . Vaginal Bleeding   HPI  Ms.  Emily Cain is a 22 y.o. year old [id number] female who presents to MAU reporting VB since TAB on 10/09/17. 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 She has never had this happen to her before. She has a h/o Chlamydia, BV, yeast infections. She would like to be tested for STIs tonight.  Past Medical History:  Diagnosis Date  . Anemia   . Environmental allergies   . History of IUFD     Past Surgical History:  Procedure Laterality Date  . NO PAST SURGERIES      Family History  Problem Relation Age of Onset  . Cancer Maternal Aunt   . Cancer Maternal Uncle   . Cancer Maternal Grandmother     Social History   Tobacco Use  . Smoking status: Never Smoker  . Smokeless tobacco: Never Used  Substance Use Topics  . Alcohol use: No  . Drug use: No    Allergies: No Known Allergies  Medications Prior to Admission  Medication Sig Dispense Refill Last Dose  . diphenhydrAMINE (BENADRYL) 25 MG tablet Take 1 tablet (25 mg total) by mouth every 6 (six) hours. 20 tablet 0 not taking  . docusate sodium (COLACE) 250 MG capsule Take 1 capsule (250 mg total) by mouth daily. 10 capsule 0 not taking  . famotidine (PEPCID) 20 MG tablet Take 1 tablet (20 mg total) by mouth daily. 10 tablet 0 not taking  . ibuprofen (ADVIL,MOTRIN) 600 MG tablet Take 1 tablet (600 mg total) by mouth every 6 (six) hours. 30 tablet 0 not taking  . nystatin cream (MYCOSTATIN) Apply to affected area 2 times daily 15 g 0 not taking  . Prenatal Vit-Fe Fumarate-FA (PRENATAL MULTIVITAMIN) TABS tablet Take 1 tablet by mouth daily at 12 noon.   not taking  . senna-docusate (SENOKOT-S) 8.6-50 MG tablet Take 2 tablets  by mouth daily. 60 tablet 0 not taking  . sertraline (ZOLOFT) 25 MG tablet Take 1 tablet (25 mg total) by mouth daily. 30 tablet 1 not taking  . triamcinolone cream (KENALOG) 0.1 % Apply 1 application topically 2 (two) times daily. 30 g 0 not taking    Review of Systems  Constitutional: Negative.   HENT: Negative.   Eyes: Negative.   Respiratory: Negative.   Cardiovascular: Negative.   Gastrointestinal: Negative.   Endocrine: Negative.   Genitourinary: Positive for vaginal bleeding.  Musculoskeletal: Negative.   Skin: Negative.   Allergic/Immunologic: Negative.   Neurological: Negative.   Hematological: Negative.   Psychiatric/Behavioral: Negative.    Physical Exam   Blood pressure 121/71, pulse 81, temperature 99 F (37.2 C), temperature source Oral, resp. rate 16, height  (1.702 m), weight 165 lb 1.9 oz (74.9 kg), SpO2 100 %, unknown if currently breastfeeding.  Physical Exam  Nursing note and vitals reviewed. Constitutional: She is oriented to person, place, and time. She appears well-developed and well-nourished.  HENT:  Head: Normocephalic and atraumatic.  Eyes: Pupils are equal, round, and reactive to light.  Neck: Normal range of motion.  Cardiovascular: Normal rate, regular rhythm and normal heart sounds.  Respiratory: Effort normal and breath sounds normal.  GI:  Soft. Bowel sounds are normal.  Genitourinary:  Genitourinary Comments: Uterus: non-tender, SE: cervix is smooth, pink, no lesions, small amt of thick, white vaginal d/c -- WP, GC/CT done, closed/long/firm, no CMT or friability, no adnexal tenderness   Musculoskeletal: Normal range of motion.  Neurological: She is alert and oriented to person, place, and time.  Skin: Skin is warm and dry.  Psychiatric: She has a normal mood and affect. Her behavior is normal. Judgment and thought content normal.    MAU Course  Procedures  MDM CCUA UPT Wet Prep GC/CT -- pending   Results for orders placed or  performed during the hospital encounter of 12/07/17 (from the past 24 hour(s))  Urinalysis, Routine w reflex microscopic     Status: Abnormal   Collection Time: 12/07/17 12:20 AM  Result Value Ref Range   Color, Urine YELLOW YELLOW   APPearance CLEAR CLEAR   Specific Gravity, Urine 1.018 1.005 - 1.030   pH 6.0 5.0 - 8.0   Glucose, UA NEGATIVE NEGATIVE mg/dL   Hgb urine dipstick LARGE (A) NEGATIVE   Bilirubin Urine NEGATIVE NEGATIVE   Ketones, ur NEGATIVE NEGATIVE mg/dL   Protein, ur NEGATIVE NEGATIVE mg/dL   Nitrite NEGATIVE NEGATIVE   Leukocytes, UA NEGATIVE NEGATIVE   RBC / HPF >50 (H) 0 - 5 RBC/hpf   WBC, UA 0-5 0 - 5 WBC/hpf   Bacteria, UA NONE SEEN NONE SEEN   Squamous Epithelial / LPF 0-5 0 - 5  Pregnancy, urine POC     Status: None   Collection Time: 12/07/17 12:33 AM  Result Value Ref Range   Preg Test, Ur NEGATIVE NEGATIVE  Wet prep, genital     Status: Abnormal   Collection Time: 12/07/17  1:55 AM  Result Value Ref Range   Yeast Wet Prep HPF POC NONE SEEN NONE SEEN   Trich, Wet Prep NONE SEEN NONE SEEN   Clue Cells Wet Prep HPF POC NONE SEEN NONE SEEN   WBC, Wet Prep HPF POC FEW (A) NONE SEEN   Sperm NONE SEEN     Assessment and Plan  Abnormal menses - Plan: Discharge patient - Discussed with patient that she is more than likely having her period at this time and there is no intervention necessary for that at this time - Discharge home - Patient verbalized an understanding of the plan of care and agrees.   Raelyn Mora, MSN, CNM 12/07/2017, 1:57 AM

## 2017-12-08 LAB — GC/CHLAMYDIA PROBE AMP (~~LOC~~) NOT AT ARMC
Chlamydia: NEGATIVE
Neisseria Gonorrhea: NEGATIVE

## 2018-01-21 ENCOUNTER — Encounter (HOSPITAL_COMMUNITY): Payer: Self-pay | Admitting: Emergency Medicine

## 2018-01-21 ENCOUNTER — Emergency Department (HOSPITAL_COMMUNITY)
Admission: EM | Admit: 2018-01-21 | Discharge: 2018-01-22 | Disposition: A | Discharge status: Home / Self Care | Payer: Self-pay | Attending: Emergency Medicine | Admitting: Emergency Medicine

## 2018-01-21 DIAGNOSIS — R102 Pelvic and perineal pain: Secondary | ICD-10-CM | POA: Insufficient documentation

## 2018-01-21 DIAGNOSIS — Z79899 Other long term (current) drug therapy: Secondary | ICD-10-CM | POA: Insufficient documentation

## 2018-01-21 DIAGNOSIS — R112 Nausea with vomiting, unspecified: Secondary | ICD-10-CM | POA: Insufficient documentation

## 2018-01-21 DIAGNOSIS — R1084 Generalized abdominal pain: Secondary | ICD-10-CM | POA: Insufficient documentation

## 2018-01-21 DIAGNOSIS — R197 Diarrhea, unspecified: Secondary | ICD-10-CM | POA: Insufficient documentation

## 2018-01-21 DIAGNOSIS — N939 Abnormal uterine and vaginal bleeding, unspecified: Secondary | ICD-10-CM | POA: Insufficient documentation

## 2018-01-21 LAB — COMPREHENSIVE METABOLIC PANEL
ALBUMIN: 4.2 g/dL (ref 3.5–5.0)
ALT: 11 U/L — ABNORMAL LOW (ref 14–54)
ANION GAP: 12 (ref 5–15)
AST: 15 U/L (ref 15–41)
Alkaline Phosphatase: 64 U/L (ref 38–126)
BILIRUBIN TOTAL: 0.6 mg/dL (ref 0.3–1.2)
BUN: 9 mg/dL (ref 6–20)
CO2: 25 mmol/L (ref 22–32)
Calcium: 9.3 mg/dL (ref 8.9–10.3)
Chloride: 100 mmol/L — ABNORMAL LOW (ref 101–111)
Creatinine, Ser: 0.9 mg/dL (ref 0.44–1.00)
GFR calc Af Amer: >60 mL/min (ref >60)
GFR calc non Af Amer: >60 mL/min (ref >60)
GLUCOSE: 88 mg/dL (ref 65–99)
POTASSIUM: 3.4 mmol/L — AB (ref 3.5–5.1)
Sodium: 137 mmol/L (ref 135–145)
TOTAL PROTEIN: 7.8 g/dL (ref 6.5–8.1)

## 2018-01-21 LAB — URINALYSIS, ROUTINE W REFLEX MICROSCOPIC
BACTERIA UA: NONE SEEN
BILIRUBIN URINE: NEGATIVE
Glucose, UA: 50 mg/dL — AB
Ketones, ur: 20 mg/dL — AB
NITRITE: NEGATIVE
PROTEIN: 100 mg/dL — AB
RBC / HPF: >50 RBC/hpf — ABNORMAL HIGH (ref 0–5)
Specific Gravity, Urine: 1.029 (ref 1.005–1.030)
pH: 5 (ref 5.0–8.0)

## 2018-01-21 LAB — CBC
HEMATOCRIT: 37.1 % (ref 36.0–46.0)
Hemoglobin: 11.7 g/dL — ABNORMAL LOW (ref 12.0–15.0)
MCH: 25.6 pg — ABNORMAL LOW (ref 26.0–34.0)
MCHC: 31.5 g/dL (ref 30.0–36.0)
MCV: 81.2 fL (ref 78.0–100.0)
Platelets: 259 10*3/uL (ref 150–400)
RBC: 4.57 MIL/uL (ref 3.87–5.11)
RDW: 15.2 % (ref 11.5–15.5)
WBC: 8.6 10*3/uL (ref 4.0–10.5)

## 2018-01-21 LAB — LIPASE, BLOOD: Lipase: 34 U/L (ref 11–51)

## 2018-01-21 LAB — I-STAT BETA HCG BLOOD, ED (MC, WL, AP ONLY)

## 2018-01-21 MED ORDER — GI COCKTAIL ~~LOC~~
30.0000 mL | Freq: Once | ORAL | Status: AC
  Administered 2018-01-21: 30 mL via ORAL
  Filled 2018-01-21: qty 30

## 2018-01-21 MED ORDER — ONDANSETRON HCL 4 MG/2ML IJ SOLN
4.0000 mg | Freq: Once | INTRAMUSCULAR | Status: AC
  Administered 2018-01-21: 4 mg via INTRAVENOUS
  Filled 2018-01-21: qty 2

## 2018-01-21 MED ORDER — FAMOTIDINE IN NACL 20-0.9 MG/50ML-% IV SOLN
20.0000 mg | Freq: Once | INTRAVENOUS | Status: AC
  Administered 2018-01-21: 20 mg via INTRAVENOUS
  Filled 2018-01-21: qty 50

## 2018-01-21 MED ORDER — SODIUM CHLORIDE 0.9 % IV BOLUS
1000.0000 mL | Freq: Once | INTRAVENOUS | Status: AC
  Administered 2018-01-21: 1000 mL via INTRAVENOUS

## 2018-01-21 MED ORDER — DICYCLOMINE HCL 10 MG/ML IM SOLN
20.0000 mg | Freq: Once | INTRAMUSCULAR | Status: AC
  Administered 2018-01-21: 20 mg via INTRAMUSCULAR
  Filled 2018-01-21: qty 2

## 2018-01-21 NOTE — ED Provider Notes (Signed)
[facility] EMERGENCY DEPARTMENT Provider Note   CSN: [id number] Arrival date & time: 01/21/18  2059     History   Chief Complaint Chief Complaint  Patient presents with  . Abdominal Pain  . Vaginal Bleeding    HPI Emily Cain is a 22 y.o. female.  HPI 22 year old African-American female with no pertinent past medical history presents to the emergency department today for complaints of generalized abdominal pain, low back pain and vaginal bleeding.  Patient states that 2 to 3 days ago she developed some generalized abdominal pain.  Patient states the pain radiates to her lower back.  States that she has had this pain in the past but did not last this long.  Patient reports nausea but denies any emesis.  Does report diarrhea without any bloody stools.  States that she started having vaginal bleeding today.  States that she is passing significant amount of blood clots.  States her last period was 11 days ago.  Patient reports having regular periods.  She is going through approximately 2 pads an hour her baseline is usually 1 for her periods.  She reports vaginal discharge that is white in nature.  States that she probably has bacterial vaginosis or yeast infection.  Patient denies being sexually active or concern for STD.  Patient took ibuprofen and naproxen for her symptoms only little relief.  Denies any associated fevers or chills.  Has any chronic NSAID use or recent travel.  No known sick contacts.  States she does have a history of having an abortion in March.  Pt denies any fever, chill, ha, vision changes, lightheadedness, dizziness, congestion, neck pain, cp, sob, cough, urinary symptoms, change in bowel habits, melena, hematochezia, lower extremity paresthesias.  Past Medical History:  Diagnosis Date  . Anemia   . Environmental allergies   . History of IUFD     Patient Active Problem List   Diagnosis Date Noted  . Abnormal menses 12/07/2017  . 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 NO PAST SURGERIES       OB History    Gravida  2   Para  2   Term  2   Preterm  0   AB  0   Living  1     SAB  0   TAB  0   Ectopic  0   Multiple  0   Live Births  1            Home Medications    Prior to Admission medications   Medication Sig Start Date End Date Taking? Authorizing Provider  diphenhydrAMINE (BENADRYL) 25 MG tablet Take 1 tablet (25 mg total) by mouth every 6 (six) hours. Patient not taking: Reported on 01/21/2018 07/24/17   Georgiana Shore, PA-C  ibuprofen (ADVIL,MOTRIN) 600 MG tablet Take 1 tablet (600 mg total) by mouth every 6 (six) hours. Patient not taking: Reported on 01/21/2018 06/08/17   Minott, Lynnae Prude, MD  ibuprofen (ADVIL,MOTRIN) 600 MG tablet Take 1 tablet (600 mg total) by mouth every 6 (six) hours as needed. Patient not taking: Reported on 01/21/2018 12/07/17   Raelyn Mora, CNM  nystatin cream (MYCOSTATIN) Apply to affected area 2 times daily Patient not taking: Reported on 01/21/2018 07/06/17   Marny Lowenstein, PA-C  sertraline (ZOLOFT) 25 MG tablet Take 1 tablet (25 mg total) by mouth  daily. Patient not taking: Reported on 01/21/2018 07/05/17   Marny Lowenstein, PA-C  triamcinolone cream (KENALOG) 0.1 % Apply 1 application topically 2 (two) times daily. Patient not taking: Reported on 01/21/2018 07/24/17   Gregary Cromer    Family History Family History  Problem Relation Age of Onset  . Cancer Maternal Aunt   . Cancer Maternal Uncle   . Cancer Maternal Grandmother     Social History Social History   Tobacco Use  . Smoking status: Never Smoker  . Smokeless tobacco: Never Used  Substance Use Topics  . Alcohol use: No  . Drug use: No     Allergies   Patient has no known allergies.   Review of Systems Review of Systems  All other systems reviewed and  are negative.    Physical Exam Updated Vital Signs BP 107/71   Pulse (!) 104   Temp 98.6 F (37 C) (Oral)   Resp 18   LMP 01/21/2018   SpO2 100%   Physical Exam  Constitutional: She is oriented to person, place, and time. She appears well-developed and well-nourished.  Non-toxic appearance. No distress.  HENT:  Head: Normocephalic and atraumatic.  Nose: Nose normal.  Mouth/Throat: Oropharynx is clear and moist.  Eyes: Pupils are equal, round, and reactive to light. Conjunctivae are normal. Right eye exhibits no discharge. Left eye exhibits no discharge.  Neck: Normal range of motion. Neck supple.  Cardiovascular: Normal rate, regular rhythm, normal heart sounds and intact distal pulses. Exam reveals no gallop and no friction rub.  No murmur heard. Pulmonary/Chest: Effort normal and breath sounds normal. No stridor. No respiratory distress. She has no wheezes. She has no rales. She exhibits no tenderness.  Abdominal: Soft. Normal appearance. Bowel sounds are increased. There is generalized tenderness. There is no rigidity, no rebound, no guarding, no CVA tenderness, no tenderness at McBurney's point and negative Murphy's sign.  Genitourinary:  Genitourinary Comments: Chaperone present for exam. No external lesions, swelling, erythema, or rash of the labia. No erythema, discharge or lesions noted in the vaginal vault.  Patient does have a small amount of blood in the vaginal vault but no signs of blood clots.  No CMT tenderness, bleeding or friability.  Bilateral adnexal tenderness. No mass or fullness bilaterally. No inguinal adenopathy or hernia.    Musculoskeletal: Normal range of motion. She exhibits no tenderness.  Lymphadenopathy:    She has no cervical adenopathy.  Neurological: She is alert and oriented to person, place, and time.  Skin: Skin is warm and dry. Capillary refill takes less than 2 seconds.  Psychiatric: Her behavior is normal. Judgment and thought content normal.    Nursing note and vitals reviewed.    ED Treatments / Results  Labs (all labs ordered are listed, but only abnormal results are displayed) Labs Reviewed  WET PREP, GENITAL - Abnormal; Notable for the following components:      Result Value   WBC, Wet Prep HPF POC FEW (*)    All other components within normal limits  COMPREHENSIVE METABOLIC PANEL - Abnormal; Notable for the following components:   Potassium 3.4 (*)    Chloride 100 (*)    ALT 11 (*)    All other components within normal limits  CBC - Abnormal; Notable for the following components:   Hemoglobin 11.7 (*)    MCH 25.6 (*)    All other components within normal limits  URINALYSIS, ROUTINE W REFLEX MICROSCOPIC - Abnormal; Notable for the following  components:   APPearance HAZY (*)    Glucose, UA 50 (*)    Hgb urine dipstick LARGE (*)    Ketones, ur 20 (*)    Protein, ur 100 (*)    Leukocytes, UA MODERATE (*)    RBC / HPF >50 (*)    All other components within normal limits  LIPASE, BLOOD  I-STAT BETA HCG BLOOD, ED ([facility], [facility], [facility] ONLY)  GC/CHLAMYDIA PROBE AMP ([place]) NOT [facility]    EKG None  Radiology [place] Abdomen Complete  Result Date: 01/22/2018 CLINICAL DATA:  Lower abdominal pain for 1 day. EXAM: ABDOMEN ULTRASOUND COMPLETE COMPARISON:  None. FINDINGS: Gallbladder: Physiologically distended. No gallstones or wall thickening visualized. No sonographic Murphy sign noted by sonographer. Common bile duct: Diameter: 2 mm, normal. Liver: No focal lesion identified. Within normal limits in parenchymal echogenicity. Portal vein is patent on color Doppler imaging with normal direction of blood flow towards the liver. IVC: No abnormality visualized. Pancreas: Visualized portion unremarkable. Spleen: Size and appearance within normal limits. Adjacent splenule. Right Kidney: Length: 11.5 cm. Echogenicity within normal limits. No mass or hydronephrosis visualized. Left Kidney: Length: 11.7 cm. Echogenicity within normal  limits. No mass or hydronephrosis visualized. Abdominal aorta: No aneurysm visualized. Other findings: None.  No ascites. IMPRESSION: Normal abdominal ultrasound. Electronically Signed   By: Rubye OaksMelanie  Ehinger M.D.   On: 01/22/2018 02:24   [place] Transvaginal Non-ob  Result Date: 01/22/2018 CLINICAL DATA:  Lower abdominal pain and vaginal bleeding for 1 day. EXAM: TRANSABDOMINAL AND TRANSVAGINAL ULTRASOUND OF PELVIS DOPPLER ULTRASOUND OF OVARIES TECHNIQUE: Both transabdominal and transvaginal ultrasound examinations of the pelvis were performed. Transabdominal technique was performed for global imaging of the pelvis including uterus, ovaries, adnexal regions, and pelvic cul-de-sac. It was necessary to proceed with endovaginal exam following the transabdominal exam to visualize the endometrium and ovaries. Color and duplex Doppler ultrasound was utilized to evaluate blood flow to the ovaries. COMPARISON:  None. FINDINGS: Uterus Measurements: 7.7 x 5.7 x 6.6 cm. No fibroids or other mass visualized. The uterus is retroverted. Endometrium Thickness: 6 mm, normal.  No focal abnormality visualized. Right ovary Measurements: 4.0 x 3.3 x 2.7 cm. A 2.1 cm cyst is likely physiologic. Normal blood flow. No adnexal mass. Left ovary Measurements: 5.1 x 3.8 x 3.7 cm. Normal appearance with physiologic follicles. Normal blood flow. No adnexal mass. Pulsed Doppler evaluation of both ovaries demonstrates normal low-resistance arterial and venous waveforms. Other findings Small amount of simple free fluid. IMPRESSION: 1. Physiologic 2.1 cm cyst in the right ovary. 2. Otherwise unremarkable pelvic ultrasound with Doppler. No ovarian torsion. Electronically Signed   By: Rubye OaksMelanie  Ehinger M.D.   On: 01/22/2018 02:34   [place] Pelvis Complete  Result Date: 01/22/2018 CLINICAL DATA:  Lower abdominal pain and vaginal bleeding for 1 day. EXAM: TRANSABDOMINAL AND TRANSVAGINAL ULTRASOUND OF PELVIS DOPPLER ULTRASOUND OF OVARIES TECHNIQUE: Both  transabdominal and transvaginal ultrasound examinations of the pelvis were performed. Transabdominal technique was performed for global imaging of the pelvis including uterus, ovaries, adnexal regions, and pelvic cul-de-sac. It was necessary to proceed with endovaginal exam following the transabdominal exam to visualize the endometrium and ovaries. Color and duplex Doppler ultrasound was utilized to evaluate blood flow to the ovaries. COMPARISON:  None. FINDINGS: Uterus Measurements: 7.7 x 5.7 x 6.6 cm. No fibroids or other mass visualized. The uterus is retroverted. Endometrium Thickness: 6 mm, normal.  No focal abnormality visualized. Right ovary Measurements: 4.0 x 3.3 x 2.7 cm. A 2.1 cm  cyst is likely physiologic. Normal blood flow. No adnexal mass. Left ovary Measurements: 5.1 x 3.8 x 3.7 cm. Normal appearance with physiologic follicles. Normal blood flow. No adnexal mass. Pulsed Doppler evaluation of both ovaries demonstrates normal low-resistance arterial and venous waveforms. Other findings Small amount of simple free fluid. IMPRESSION: 1. Physiologic 2.1 cm cyst in the right ovary. 2. Otherwise unremarkable pelvic ultrasound with Doppler. No ovarian torsion. Electronically Signed   By: Rubye Oaks M.D.   On: 01/22/2018 02:34   Ct Abdomen Pelvis W Contrast  Result Date: 01/22/2018 CLINICAL DATA:  Lower abdominal pain radiating to back. Vaginal bleeding. Nausea, vomiting and diarrhea. EXAM: CT ABDOMEN AND PELVIS WITH CONTRAST TECHNIQUE: Multidetector CT imaging of the abdomen and pelvis was performed using the standard protocol following bolus administration of intravenous contrast. CONTRAST:  OMNIPAQUE IOHEXOL 300 MG/ML  SOLN COMPARISON:  Pelvic ultrasound January 22, 2018 FINDINGS: LOWER CHEST: Lung bases are clear. Included heart size is normal. No pericardial effusion. HEPATOBILIARY: Liver and gallbladder are normal. PANCREAS: Normal. SPLEEN: Normal. ADRENALS/URINARY TRACT: Kidneys are  orthotopic, demonstrating symmetric enhancement. No nephrolithiasis, hydronephrosis or solid renal masses. The unopacified ureters are normal in course and caliber. Urinary bladder is partially distended and unremarkable. Normal adrenal glands. STOMACH/BOWEL: The stomach, small and large bowel are normal in course and caliber without inflammatory changes. Normal appendix. VASCULAR/LYMPHATIC: Aortoiliac vessels are normal in course and caliber. Prominent pelvic and ovarian veins. No lymphadenopathy by CT size criteria. REPRODUCTIVE: Mild fat stranding about the uterus and adnexa. OTHER: Small volume free fluid in the pelvis, likely physiologic. MUSCULOSKELETAL: Nonacute. Mild rectus abdominis diastasis. 22 mm LEFT Bartholin cyst. IMPRESSION: 1. Small volume free fluid in the pelvis and mild pelvic non-specific fat stranding, PID can have this appearance. 2. 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 Electronically Signed   By: Awilda Metro M.D.   On: 01/22/2018 03:55   [place] Art/ven Flow Abd Pelv Doppler  Result Date: 01/22/2018 CLINICAL DATA:  Lower abdominal pain and vaginal bleeding for 1 day. EXAM: TRANSABDOMINAL AND TRANSVAGINAL ULTRASOUND OF PELVIS DOPPLER ULTRASOUND OF OVARIES TECHNIQUE: Both transabdominal and transvaginal ultrasound examinations of the pelvis were performed. Transabdominal technique was performed for global imaging of the pelvis including uterus, ovaries, adnexal regions, and pelvic cul-de-sac. It was necessary to proceed with endovaginal exam following the transabdominal exam to visualize the endometrium and ovaries. Color and duplex Doppler ultrasound was utilized to evaluate blood flow to the ovaries. COMPARISON:  None. FINDINGS: Uterus Measurements: 7.7 x 5.7 x 6.6 cm. No fibroids or other mass visualized. The uterus is retroverted. Endometrium Thickness: 6 mm, normal.  No focal abnormality visualized. Right ovary Measurements: 4.0 x 3.3 x 2.7 cm. A  2.1 cm cyst is likely physiologic. Normal blood flow. No adnexal mass. Left ovary Measurements: 5.1 x 3.8 x 3.7 cm. Normal appearance with physiologic follicles. Normal blood flow. No adnexal mass. Pulsed Doppler evaluation of both ovaries demonstrates normal low-resistance arterial and venous waveforms. Other findings Small amount of simple free fluid. IMPRESSION: 1. Physiologic 2.1 cm cyst in the right ovary. 2. Otherwise unremarkable pelvic ultrasound with Doppler. No ovarian torsion. Electronically Signed   By: Rubye Oaks M.D.   On: 01/22/2018 02:34    Procedures Procedures (including critical care time)  Medications Ordered in ED Medications  dicyclomine (BENTYL) injection 20 mg (has no administration in time range)  famotidine (PEPCID) IVPB 20 mg premix (has no administration in time range)  gi cocktail (Maalox,Lidocaine,Donnatal) (has  no administration in time range)  ondansetron (ZOFRAN) injection 4 mg (has no administration in time range)  sodium chloride 0.9 % bolus 1,000 mL (has no administration in time range)     Initial Impression / Assessment and Plan / ED Course  I have reviewed the triage vital signs and the nursing notes.  Pertinent labs & imaging results that were available during my care of the patient were reviewed by me and considered in my medical decision making (see chart for details).     Patient presents to the ED for abdominal pain with associated nausea, vomiting and diarrhea and vaginal bleeding.  Reports associated nausea.  Patient overall well-appearing and nontoxic on evaluation.  Vital signs are reassuring.  Mild tachycardia that improved with fluids.  Pelvic exam reveals some mild vaginal bleeding not any significant signs of blood clots.  Patient does have bilateral adnexal tenderness but no cervical motion tenderness.  Generalized abdominal pain to palpation but no signs of peritonitis.  Bowel sounds present in all 4 quadrants.  Patient's lab work has  been reassuring.  No leukocytosis.  No significant electrolyte derangement.  Normal liver enzymes and kidney function.  Hemoglobin at patient's baseline and improved.  Lipase negative.  UA with large amounts of hemoglobin and small amount of WBCs but no bacteria.  Patient has no urinary symptoms will not treat for UTI.Marland Kitchen  Patient's pregnancy test was negative.  Wet prep shows few WBCs but no other acute abnormalities.  Initial abdominal ultrasound shows no acute abnormality's.  Pelvic ultrasound reveals physiological cyst but no other acute abnormalities.  Patient was given GI cocktail, Pepcid, fluids in the ED.  She reports ongoing abdominal pain.  Discussed that her symptoms seem likely related to a viral gastroenteritis.  Patient was concerned and requested further imaging.  CT scan was performed that showed small amount of pelvic stranding could resemble PID.  Patient states that she is not sexually active and has no concern for STD gonorrhea and Chlamydia cultures are pending.  We will treat patient given her bilateral adnexal tenderness and CT findings with doxycycline for 14 days.  Given patient's vaginal bleeding have given her prescription for the Provera and the significant amount of blood clot she states that she is passing and the amount of pads that she is going through per hour.  Patient will follow-up with OB/GYN doctor tomorrow.  Patient's vital signs improved with fluid.  On repeat assessment patient was sleeping however when I wake her she says her pain is still there.  Repeat abdominal exam shows no signs of peritonitis.  Pt is hemodynamically stable, in NAD, & able to ambulate in the ED. Evaluation does not show pathology that would require ongoing emergent intervention or inpatient treatment. I explained the diagnosis to the patient. Pain has been managed & has no complaints prior to dc. Pt is comfortable with above plan and is stable for discharge at this time. All questions were answered  prior to disposition. Strict return precautions for f/u to the ED were discussed. Encouraged follow up with PCP.   Final Clinical Impressions(s) / ED Diagnoses   Final diagnoses:  Vaginal bleeding  Nausea vomiting and diarrhea  Generalized abdominal pain    ED Discharge Orders        Ordered    doxycycline (VIBRAMYCIN) 100 MG capsule  2 times daily     01/22/18 0449    ondansetron (ZOFRAN ODT) 4 MG disintegrating tablet  Every 8 hours PRN  01/22/18 0449    dicyclomine (BENTYL) 20 MG tablet  2 times daily     01/22/18 0449    medroxyPROGESTERone (PROVERA) 5 MG tablet  Daily     01/22/18 0454       Rise Mu, PA-C 01/22/18 1610    Gilda Crease, MD 01/22/18 [phone]

## 2018-01-21 NOTE — ED Triage Notes (Addendum)
Reports lower abdominal pain that radiates into the back and vaginal bleeding.  Reports period ended 11 days prior.  Reports having an abortion in march.  [facility]  Also reporting n/v/ and diarrhea.

## 2018-01-21 NOTE — ED Notes (Signed)
Pelvic cart at bedside. 

## 2018-01-22 ENCOUNTER — Emergency Department (HOSPITAL_COMMUNITY): Payer: Self-pay

## 2018-01-22 ENCOUNTER — Encounter (HOSPITAL_COMMUNITY): Payer: Self-pay

## 2018-01-22 LAB — WET PREP, GENITAL
Clue Cells Wet Prep HPF POC: NONE SEEN
SPERM: NONE SEEN
Trich, Wet Prep: NONE SEEN
YEAST WET PREP: NONE SEEN

## 2018-01-22 MED ORDER — DOXYCYCLINE HYCLATE 100 MG PO CAPS: 100.0000 mg | ORAL_CAPSULE | Freq: Two times a day (BID) | ORAL | 0 refills | Status: DC

## 2018-01-22 MED ORDER — DICYCLOMINE HCL 20 MG PO TABS: 20.0000 mg | ORAL_TABLET | Freq: Two times a day (BID) | ORAL | 0 refills | Status: DC

## 2018-01-22 MED ORDER — IOHEXOL 300 MG/ML  SOLN
100.0000 mL | Freq: Once | INTRAMUSCULAR | Status: AC | PRN
  Administered 2018-01-22: 100 mL via INTRAVENOUS

## 2018-01-22 MED ORDER — KETOROLAC TROMETHAMINE 15 MG/ML IJ SOLN
15.0000 mg | Freq: Once | INTRAMUSCULAR | Status: AC
  Administered 2018-01-22: 15 mg via INTRAVENOUS
  Filled 2018-01-22: qty 1

## 2018-01-22 MED ORDER — MEDROXYPROGESTERONE ACETATE 5 MG PO TABS: 5.0000 mg | ORAL_TABLET | Freq: Every day | ORAL | 0 refills | Status: DC

## 2018-01-22 MED ORDER — ONDANSETRON 4 MG PO TBDP: 4.0000 mg | ORAL_TABLET | Freq: Three times a day (TID) | ORAL | 0 refills | Status: DC | PRN

## 2018-01-22 MED ORDER — MORPHINE SULFATE (PF) 4 MG/ML IV SOLN
4.0000 mg | Freq: Once | INTRAVENOUS | Status: AC
  Administered 2018-01-22: 4 mg via INTRAVENOUS
  Filled 2018-01-22: qty 1

## 2018-01-22 NOTE — Discharge Instructions (Signed)
I suspect that your symptoms may be due to a viral gastroenteritis.  Would recommend taking the Bentyl and Zofran for nausea.  Given that your lower abdominal cramping and tenderness to palpation of the pelvic exam we will treat you for PID.  This is an inflammatory disease.  Your gonorrhea and Chlamydia cultures are pending.  Take the doxycycline as prescribed.  In terms of your vaginal bleeding have given you medication that will stop the bleeding.  It is very important for you to follow-up with OB/GYN doctor this week.  Return to ED develop any worsening symptoms.

## 2018-01-23 LAB — GC/CHLAMYDIA PROBE AMP (~~LOC~~) NOT AT ARMC
CHLAMYDIA, DNA PROBE: NEGATIVE
Neisseria Gonorrhea: POSITIVE — AB

## 2018-01-27 ENCOUNTER — Emergency Department (HOSPITAL_COMMUNITY)
Admission: EM | Admit: 2018-01-27 | Discharge: 2018-01-27 | Disposition: A | Discharge status: Home / Self Care | Payer: Medicaid Other | Attending: Emergency Medicine | Admitting: Emergency Medicine

## 2018-01-27 ENCOUNTER — Encounter (HOSPITAL_COMMUNITY): Payer: Self-pay

## 2018-01-27 ENCOUNTER — Other Ambulatory Visit: Payer: Self-pay

## 2018-01-27 DIAGNOSIS — A549 Gonococcal infection, unspecified: Secondary | ICD-10-CM | POA: Insufficient documentation

## 2018-01-27 DIAGNOSIS — N739 Female pelvic inflammatory disease, unspecified: Secondary | ICD-10-CM | POA: Insufficient documentation

## 2018-01-27 DIAGNOSIS — N730 Acute parametritis and pelvic cellulitis: Secondary | ICD-10-CM

## 2018-01-27 DIAGNOSIS — Z79899 Other long term (current) drug therapy: Secondary | ICD-10-CM | POA: Insufficient documentation

## 2018-01-27 LAB — HIV ANTIBODY (ROUTINE TESTING W REFLEX): HIV Screen 4th Generation wRfx: NONREACTIVE

## 2018-01-27 LAB — RPR: RPR Ser Ql: NONREACTIVE

## 2018-01-27 MED ORDER — CEFTRIAXONE SODIUM 250 MG IJ SOLR
250.0000 mg | Freq: Once | INTRAMUSCULAR | Status: AC
  Administered 2018-01-27: 250 mg via INTRAMUSCULAR
  Filled 2018-01-27: qty 250

## 2018-01-27 MED ORDER — DOXYCYCLINE HYCLATE 100 MG PO CAPS: 100.0000 mg | ORAL_CAPSULE | Freq: Two times a day (BID) | ORAL | 0 refills | Status: DC

## 2018-01-27 MED ORDER — STERILE WATER FOR INJECTION IJ SOLN
INTRAMUSCULAR | Status: AC
  Administered 2018-01-27: 04:00:00
  Filled 2018-01-27: qty 10

## 2018-01-27 MED ORDER — HYDROCORTISONE 2.5 % RE CREA: TOPICAL_CREAM | RECTAL | 1 refills | Status: DC

## 2018-01-27 MED ORDER — DOXYCYCLINE HYCLATE 100 MG PO TABS
100.0000 mg | ORAL_TABLET | Freq: Once | ORAL | Status: AC
  Administered 2018-01-27: 100 mg via ORAL
  Filled 2018-01-27: qty 1

## 2018-01-27 NOTE — ED Triage Notes (Signed)
Pt was seen here two weeks ago, diagnosed with gonorrhea and PID, unable to get prescription for antibiotic and pain is worse, here for antibiotics.

## 2018-01-27 NOTE — ED Provider Notes (Signed)
[facility] Provider Note   CSN: [id number] Arrival date & time: 01/27/18  0048     History   Chief Complaint Chief Complaint  Patient presents with  . Exposure to STD    HPI Emily Cain is a 22 y.o. female.  Patient is a 22 year old female returning today for persistent pelvic pain and rectal pain.  Patient was seen 6 days ago for pelvic pain and vaginal bleeding.  At that time she had a CT scan that showed concern for possible PID.  She had cultures done and she was discharged with doxycycline.  She states she took the prescription to the pharmacy but they wanted $35 and she could not pay for that.  Since that time she has had persistent pain that is worsening in her pelvic area.  She also had a positive culture for gonorrhea.  Patient states she has had ongoing pain in her rectum as well which is worse when she has a bowel movement.  She denies any blood in her stool.  She has had some chills but denies any fever.  She has had no nausea or vomiting.  No vaginal discharge or dysuria  The history is provided by the patient.  Exposure to STD  This is a recurrent problem.    Past Medical History:  Diagnosis Date  . Anemia   . Environmental allergies   . History of IUFD     Patient Active Problem List   Diagnosis Date Noted  . Abnormal menses 12/07/2017  . 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 NO PAST SURGERIES       OB History    Gravida  2   Para  2   Term  2   Preterm  0   AB  0   Living  1     SAB  0   TAB  0   Ectopic  0   Multiple  0   Live Births  1            Home Medications    Prior to Admission medications   Medication Sig Start Date End Date Taking? Authorizing Provider  dicyclomine (BENTYL) 20 MG tablet Take 1 tablet (20 mg total) by mouth 2 (two) times daily. 01/22/18    Rise MuLeaphart, Kenneth T, PA-C  diphenhydrAMINE (BENADRYL) 25 MG tablet Take 1 tablet (25 mg total) by mouth every 6 (six) hours. Patient not taking: Reported on 01/21/2018 07/24/17   Mathews RobinsonsMitchell, Jessica B, PA-C  doxycycline (VIBRAMYCIN) 100 MG capsule Take 1 capsule (100 mg total) by mouth 2 (two) times daily. 01/22/18   Rise MuLeaphart, Kenneth T, PA-C  ibuprofen (ADVIL,MOTRIN) 600 MG tablet Take 1 tablet (600 mg total) by mouth every 6 (six) hours. Patient not taking: Reported on 01/21/2018 06/08/17   Minott, Lynnae PrudeKeriann S, MD  ibuprofen (ADVIL,MOTRIN) 600 MG tablet Take 1 tablet (600 mg total) by mouth every 6 (six) hours as needed. Patient not taking: Reported on 01/21/2018 12/07/17   Raelyn Moraawson, Rolitta, CNM  medroxyPROGESTERone (PROVERA) 5 MG tablet Take 1 tablet (5 mg total) by mouth daily. 01/22/18   Rise MuLeaphart, Kenneth T, PA-C  nystatin cream (MYCOSTATIN) Apply to affected area 2 times daily Patient not taking: Reported on 01/21/2018 07/06/17   Marny LowensteinWenzel, Julie N, PA-C  ondansetron (ZOFRAN ODT) 4 MG disintegrating tablet Take 1 tablet (4 mg total) by mouth  every 8 (eight) hours as needed for nausea or vomiting. 01/22/18   Demetrios Loll T, PA-C  sertraline (ZOLOFT) 25 MG tablet Take 1 tablet (25 mg total) by mouth daily. Patient not taking: Reported on 01/21/2018 07/05/17   Marny Lowenstein, PA-C  triamcinolone cream (KENALOG) 0.1 % Apply 1 application topically 2 (two) times daily. Patient not taking: Reported on 01/21/2018 07/24/17   Gregary Cromer    Family History Family History  Problem Relation Age of Onset  . Cancer Maternal Aunt   . Cancer Maternal Uncle   . Cancer Maternal Grandmother     Social History Social History   Tobacco Use  . Smoking status: Never Smoker  . Smokeless tobacco: Never Used  Substance Use Topics  . Alcohol use: No  . Drug use: No     Allergies   Patient has no known allergies.   Review of Systems Review of Systems  All other systems reviewed and are  negative.    Physical Exam Updated Vital Signs BP 114/79 (BP Location: Right Arm)   Pulse (!) 107   Temp 98.9 F (37.2 C) (Oral)   Resp 14   LMP 01/21/2018   SpO2 97%   Physical Exam  Constitutional: She is oriented to person, place, and time. She appears well-developed and well-nourished. No distress.  HENT:  Head: Normocephalic and atraumatic.  Mouth/Throat: Oropharynx is clear and moist.  Eyes: Pupils are equal, round, and reactive to light. Conjunctivae and EOM are normal.  Neck: Normal range of motion. Neck supple.  Cardiovascular: Regular rhythm and intact distal pulses. Tachycardia present.  No murmur heard. Pulmonary/Chest: Effort normal and breath sounds normal. No respiratory distress. She has no wheezes. She has no rales.  Abdominal: Soft. She exhibits no distension. There is tenderness in the suprapubic area. There is no rebound and no guarding.    Diffuse pelvic tenderness  Genitourinary:     Musculoskeletal: Normal range of motion. She exhibits no edema or tenderness.  Neurological: She is alert and oriented to person, place, and time.  Skin: Skin is warm and dry. No rash noted. No erythema.  Psychiatric: She has a normal mood and affect. Her behavior is normal.  Nursing note and vitals reviewed.    ED Treatments / Results  Labs (all labs ordered are listed, but only abnormal results are displayed) Labs Reviewed  HIV ANTIBODY (ROUTINE TESTING)  RPR    EKG None  Radiology No results found.  Procedures Procedures (including critical care time)  Medications Ordered in ED Medications  cefTRIAXone (ROCEPHIN) injection 250 mg (has no administration in time range)  doxycycline (VIBRA-TABS) tablet 100 mg (has no administration in time range)     Initial Impression / Assessment and Plan / ED Course  I have reviewed the triage vital signs and the nursing notes.  Pertinent labs & imaging results that were available during my care of the patient  were reviewed by me and considered in my medical decision making (see chart for details).     Patient presenting today with persistent pelvic pain with recent evaluation 6 days ago with CT scan showing concern for PID and patient did come back positive for gonorrhea.  She has not taken treatment because she could not afford the medication.  She is also complaining of pain around her rectum however there are no signs of thrombosed hemorrhoids or perirectal abscess.  There was there is no localized tenderness or swelling.  Could be from pelvic pathology and  referred pain.  Patient given Rocephin IM and a dose of doxycycline.  We will also give a new prescription of doxycycline and she can get it for $12.  Patient was tested for HIV and syphilis as well.  Final Clinical Impressions(s) / ED Diagnoses   Final diagnoses:  PID (acute pelvic inflammatory disease)  Gonorrhea    ED Discharge Orders        Ordered    doxycycline (VIBRAMYCIN) 100 MG capsule  2 times daily     01/27/18 0440       Gwyneth Sprout, MD 01/27/18 [id number]

## 2018-04-30 ENCOUNTER — Emergency Department (HOSPITAL_BASED_OUTPATIENT_CLINIC_OR_DEPARTMENT_OTHER)
Admission: EM | Admit: 2018-04-30 | Discharge: 2018-04-30 | Disposition: A | Discharge status: Home / Self Care | Payer: Medicaid Other | Attending: Emergency Medicine | Admitting: Emergency Medicine

## 2018-04-30 ENCOUNTER — Encounter (HOSPITAL_BASED_OUTPATIENT_CLINIC_OR_DEPARTMENT_OTHER): Payer: Self-pay | Admitting: Emergency Medicine

## 2018-04-30 ENCOUNTER — Other Ambulatory Visit: Payer: Self-pay

## 2018-04-30 DIAGNOSIS — Z79899 Other long term (current) drug therapy: Secondary | ICD-10-CM | POA: Insufficient documentation

## 2018-04-30 DIAGNOSIS — F10120 Alcohol abuse with intoxication, uncomplicated: Secondary | ICD-10-CM | POA: Diagnosis not present

## 2018-04-30 DIAGNOSIS — R1110 Vomiting, unspecified: Secondary | ICD-10-CM | POA: Diagnosis present

## 2018-04-30 DIAGNOSIS — E860 Dehydration: Secondary | ICD-10-CM

## 2018-04-30 LAB — URINALYSIS, ROUTINE W REFLEX MICROSCOPIC
Bilirubin Urine: NEGATIVE
Glucose, UA: NEGATIVE mg/dL
HGB URINE DIPSTICK: NEGATIVE
KETONES UR: NEGATIVE mg/dL
Nitrite: NEGATIVE
PROTEIN: NEGATIVE mg/dL
Specific Gravity, Urine: 1.02 (ref 1.005–1.030)
pH: 7 (ref 5.0–8.0)

## 2018-04-30 LAB — PREGNANCY, URINE: PREG TEST UR: NEGATIVE

## 2018-04-30 LAB — URINALYSIS, MICROSCOPIC (REFLEX): RBC / HPF: NONE SEEN RBC/hpf (ref 0–5)

## 2018-04-30 MED ORDER — ONDANSETRON HCL 4 MG/2ML IJ SOLN
4.0000 mg | Freq: Once | INTRAMUSCULAR | Status: AC
  Administered 2018-04-30: 4 mg via INTRAVENOUS
  Filled 2018-04-30: qty 2

## 2018-04-30 MED ORDER — ACETAMINOPHEN 325 MG PO TABS
650.0000 mg | ORAL_TABLET | Freq: Once | ORAL | Status: AC
  Administered 2018-04-30: 650 mg via ORAL
  Filled 2018-04-30: qty 2

## 2018-04-30 MED ORDER — SODIUM CHLORIDE 0.9 % IV BOLUS
1000.0000 mL | Freq: Once | INTRAVENOUS | Status: AC
  Administered 2018-04-30: 1000 mL via INTRAVENOUS

## 2018-04-30 NOTE — ED Triage Notes (Signed)
Patient states that she was out drinking last night " and I think I drank too much" - the patient states that she is nauseated and vomiting  - " my body is so tender as well"

## 2018-04-30 NOTE — ED Provider Notes (Signed)
[facility] EMERGENCY DEPARTMENT Provider Note   CSN: [id number] Arrival date & time: 04/30/18  1150     History   Chief Complaint Chief Complaint  Patient presents with  . Emesis    HPI Emily Cain is a 22 y.o. female.  The history is provided by the patient.  Emesis   This is a new problem. The current episode started 6 to 12 hours ago. The problem occurs 5 to 10 times per day. The problem has not changed since onset.The emesis has an appearance of stomach contents. There has been no fever. Associated symptoms include headaches and myalgias. Pertinent negatives include no chills, no cough, no diarrhea, no fever and no URI. Associated symptoms comments: No vaginal d/c or pain.  No urinary sx.  LMP was this week.  No CP or SOB.. Risk factors: had a large amt to drink last night and sick ever since.    Past Medical History:  Diagnosis Date  . Anemia   . Environmental allergies   . History of IUFD     Patient Active Problem List   Diagnosis Date Noted  . Abnormal menses 12/07/2017  . 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 NO PAST SURGERIES       OB History    Gravida  2   Para  2   Term  2   Preterm  0   AB  0   Living  1     SAB  0   TAB  0   Ectopic  0   Multiple  0   Live Births  1            Home Medications    Prior to Admission medications   Medication Sig Start Date End Date Taking? Authorizing Provider  dicyclomine (BENTYL) 20 MG tablet Take 1 tablet (20 mg total) by mouth 2 (two) times daily. 01/22/18   Rise MuLeaphart, Kenneth T, PA-C  diphenhydrAMINE (BENADRYL) 25 MG tablet Take 1 tablet (25 mg total) by mouth every 6 (six) hours. Patient not taking: Reported on 01/21/2018 07/24/17   Mathews RobinsonsMitchell, Jessica B, PA-C  doxycycline (VIBRAMYCIN) 100 MG capsule Take 1 capsule (100 mg total) by mouth 2 (two) times daily.  01/27/18   Gwyneth SproutPlunkett, Malyna Budney, MD  hydrocortisone (ANUSOL-HC) 2.5 % rectal cream Apply rectally 2 times daily 01/27/18   Gwyneth SproutPlunkett, Renna Kilmer, MD  ibuprofen (ADVIL,MOTRIN) 600 MG tablet Take 1 tablet (600 mg total) by mouth every 6 (six) hours. Patient not taking: Reported on 01/21/2018 06/08/17   Minott, Lynnae PrudeKeriann S, MD  ibuprofen (ADVIL,MOTRIN) 600 MG tablet Take 1 tablet (600 mg total) by mouth every 6 (six) hours as needed. Patient not taking: Reported on 01/21/2018 12/07/17   Raelyn Moraawson, Rolitta, CNM  medroxyPROGESTERone (PROVERA) 5 MG tablet Take 1 tablet (5 mg total) by mouth daily. 01/22/18   Rise MuLeaphart, Kenneth T, PA-C  nystatin cream (MYCOSTATIN) Apply to affected area 2 times daily Patient not taking: Reported on 01/21/2018 07/06/17   Marny LowensteinWenzel, Julie N, PA-C  ondansetron (ZOFRAN ODT) 4 MG disintegrating tablet Take 1 tablet (4 mg total) by mouth every 8 (eight) hours as needed for nausea or vomiting. 01/22/18   Rise MuLeaphart, Kenneth T, PA-C  sertraline (ZOLOFT) 25 MG tablet Take 1 tablet (25 mg total) by mouth daily. Patient not taking: Reported on 01/21/2018 07/05/17   Marny LowensteinWenzel, Julie N, PA-C  triamcinolone cream (  KENALOG) 0.1 % Apply 1 application topically 2 (two) times daily. Patient not taking: Reported on 01/21/2018 07/24/17   Gregary Cromer    Family History Family History  Problem Relation Age of Onset  . Cancer Maternal Aunt   . Cancer Maternal Uncle   . Cancer Maternal Grandmother     Social History Social History   Tobacco Use  . Smoking status: Never Smoker  . Smokeless tobacco: Never Used  Substance Use Topics  . Alcohol use: No  . Drug use: No     Allergies   Patient has no known allergies.   Review of Systems Review of Systems  Constitutional: Negative for chills and fever.  Respiratory: Negative for cough.   Gastrointestinal: Positive for vomiting. Negative for diarrhea.  Musculoskeletal: Positive for myalgias.  Neurological: Positive for headaches.  All other  systems reviewed and are negative.    Physical Exam Updated Vital Signs BP 122/79 (BP Location: Left Arm)   Pulse 96   Temp 98.5 F (36.9 C) (Oral)   Resp 18   Ht 5\' 7"  (1.702 m)   Wt 71.2 kg   SpO2 100%   BMI 24.59 kg/m   Physical Exam  Constitutional: She is oriented to person, place, and time. She appears well-developed and well-nourished. No distress.  HENT:  Head: Normocephalic and atraumatic.  Mouth/Throat: Oropharynx is clear and moist.  Eyes: Pupils are equal, round, and reactive to light. Conjunctivae and EOM are normal.  Neck: Normal range of motion. Neck supple.  Cardiovascular: Regular rhythm and intact distal pulses. Tachycardia present.  No murmur heard. Pulmonary/Chest: Effort normal and breath sounds normal. No respiratory distress. She has no wheezes. She has no rales.  Abdominal: Soft. She exhibits no distension. There is tenderness. There is no rebound and no guarding.  Mild diffuse tenderness.  No guarding  Musculoskeletal: Normal range of motion. She exhibits no edema or tenderness.  Neurological: She is alert and oriented to person, place, and time.  Skin: Skin is warm and dry. No rash noted. No erythema.  Psychiatric: She has a normal mood and affect. Her behavior is normal.  Nursing note and vitals reviewed.    ED Treatments / Results  Labs (all labs ordered are listed, but only abnormal results are displayed) Labs Reviewed  URINALYSIS, ROUTINE W REFLEX MICROSCOPIC - Abnormal; Notable for the following components:      Result Value   Leukocytes, UA SMALL (*)    All other components within normal limits  URINALYSIS, MICROSCOPIC (REFLEX) - Abnormal; Notable for the following components:   Bacteria, UA RARE (*)    All other components within normal limits  PREGNANCY, URINE    EKG None  Radiology No results found.  Procedures Procedures (including critical care time)  Medications Ordered in ED Medications  sodium chloride 0.9 % bolus  1,000 mL (1,000 mLs Intravenous New Bag/Given 04/30/18 1232)  ondansetron (ZOFRAN) injection 4 mg (4 mg Intravenous Given 04/30/18 1237)     Initial Impression / Assessment and Plan / ED Course  I have reviewed the triage vital signs and the nursing notes.  Pertinent labs & imaging results that were available during my care of the patient were reviewed by me and considered in my medical decision making (see chart for details).     Patient was symptoms most consistent with a hangover.  She drank significant amounts of alcohol and liquor last night which she does not normally do.  She is having vomiting, headache and  diffuse abdominal discomfort.  She denies any urinary or vaginal symptoms at this time.  We will do a UPT to ensure no signs of pregnancy.  Patient given IV fluids and Zofran with improvement in her symptoms.  2:27 PM Pt improved.  UPT neg.  uA without convicing findings for infection  Final Clinical Impressions(s) / ED Diagnoses   Final diagnoses:  Dehydration  Hangover without complication [facility])    ED Discharge Orders    None       Gwyneth Sprout, MD 04/30/18 1428

## 2018-08-01 ENCOUNTER — Encounter (HOSPITAL_COMMUNITY): Payer: Self-pay | Admitting: Emergency Medicine

## 2018-08-01 ENCOUNTER — Emergency Department (HOSPITAL_COMMUNITY)
Admission: EM | Admit: 2018-08-01 | Discharge: 2018-08-01 | Disposition: A | Discharge status: Home / Self Care | Payer: Medicaid Other | Attending: Emergency Medicine | Admitting: Emergency Medicine

## 2018-08-01 ENCOUNTER — Other Ambulatory Visit: Payer: Self-pay

## 2018-08-01 DIAGNOSIS — Z79899 Other long term (current) drug therapy: Secondary | ICD-10-CM | POA: Insufficient documentation

## 2018-08-01 DIAGNOSIS — T50901A Poisoning by unspecified drugs, medicaments and biological substances, accidental (unintentional), initial encounter: Secondary | ICD-10-CM

## 2018-08-01 DIAGNOSIS — T43221A Poisoning by selective serotonin reuptake inhibitors, accidental (unintentional), initial encounter: Secondary | ICD-10-CM | POA: Insufficient documentation

## 2018-08-01 LAB — COMPREHENSIVE METABOLIC PANEL
ALBUMIN: 4.5 g/dL (ref 3.5–5.0)
ALT: 11 U/L (ref 0–44)
AST: 16 U/L (ref 15–41)
Alkaline Phosphatase: 55 U/L (ref 38–126)
Anion gap: 8 (ref 5–15)
BILIRUBIN TOTAL: 0.7 mg/dL (ref 0.3–1.2)
BUN: 7 mg/dL (ref 6–20)
CALCIUM: 9.1 mg/dL (ref 8.9–10.3)
CO2: 26 mmol/L (ref 22–32)
Chloride: 102 mmol/L (ref 98–111)
Creatinine, Ser: 0.68 mg/dL (ref 0.44–1.00)
GFR calc Af Amer: >60 mL/min (ref >60)
GFR calc non Af Amer: >60 mL/min (ref >60)
GLUCOSE: 102 mg/dL — AB (ref 70–99)
Potassium: 3.3 mmol/L — ABNORMAL LOW (ref 3.5–5.1)
Sodium: 136 mmol/L (ref 135–145)
TOTAL PROTEIN: 7.4 g/dL (ref 6.5–8.1)

## 2018-08-01 LAB — CBC
HCT: 39.5 % (ref 36.0–46.0)
Hemoglobin: 12.5 g/dL (ref 12.0–15.0)
MCH: 26.1 pg (ref 26.0–34.0)
MCHC: 31.6 g/dL (ref 30.0–36.0)
MCV: 82.5 fL (ref 80.0–100.0)
PLATELETS: 208 10*3/uL (ref 150–400)
RBC: 4.79 MIL/uL (ref 3.87–5.11)
RDW: 15 % (ref 11.5–15.5)
WBC: 6.1 10*3/uL (ref 4.0–10.5)
nRBC: 0 % (ref 0.0–0.2)

## 2018-08-01 LAB — ETHANOL

## 2018-08-01 LAB — I-STAT BETA HCG BLOOD, ED (MC, WL, AP ONLY): I-stat hCG, quantitative: <5 m[IU]/mL (ref <5)

## 2018-08-01 LAB — ACETAMINOPHEN LEVEL

## 2018-08-01 LAB — SALICYLATE LEVEL

## 2018-08-01 MED ORDER — POTASSIUM CHLORIDE CRYS ER 20 MEQ PO TBCR
40.0000 meq | EXTENDED_RELEASE_TABLET | Freq: Once | ORAL | Status: AC
  Administered 2018-08-01: 40 meq via ORAL
  Filled 2018-08-01: qty 2

## 2018-08-01 MED ORDER — ONDANSETRON HCL 4 MG/2ML IJ SOLN
4.0000 mg | Freq: Once | INTRAMUSCULAR | Status: AC
  Administered 2018-08-01: 4 mg via INTRAVENOUS
  Filled 2018-08-01: qty 2

## 2018-08-01 NOTE — Discharge Instructions (Addendum)
Depression medication has to be taken every day, and exactly as prescribed. Otherwise, it will not work.  Please make arrangements for evaluation by a mental health professional - there are several options in the resource page with these instructions.

## 2018-08-01 NOTE — ED Provider Notes (Signed)
[facility]-EMERGENCY DEPT Provider Note   CSN: [id number] Arrival date & time: 08/01/18  0149     History   Chief Complaint Chief Complaint  Patient presents with  . Drug Overdose    HPI Emily Cain is a 22 y.o. female.  The history is provided by the patient.  She has history of anemia and postpartum depression and comes in after an accidental overdose.  She has a prescription for sertraline, and she takes several when she feels that her mood needs to be improved.  Tonight, she was feeling more depressed and she took 10 pills, thinking that this would help.  She denies suicidal intent.  She does admit to crying spells, early morning awakening, and anhedonia.  She denies hallucinations and denies suicidal or homicidal thoughts.  [facility], has not seen a mental health professional.  Past Medical History:  Diagnosis Date  . Anemia   . Environmental allergies   . History of IUFD     Patient Active Problem List   Diagnosis Date Noted  . Abnormal menses 12/07/2017  . 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 NO PAST SURGERIES       OB History    Gravida  2   Para  2   Term  2   Preterm  0   AB  0   Living  1     SAB  0   TAB  0   Ectopic  0   Multiple  0   Live Births  1            Home Medications    Prior to Admission medications   Medication Sig Start Date End Date Taking? Authorizing Provider  dicyclomine (BENTYL) 20 MG tablet Take 1 tablet (20 mg total) by mouth 2 (two) times daily. 01/22/18   Rise MuLeaphart, Kenneth T, PA-C  diphenhydrAMINE (BENADRYL) 25 MG tablet Take 1 tablet (25 mg total) by mouth every 6 (six) hours. Patient not taking: Reported on 01/21/2018 07/24/17   Mathews RobinsonsMitchell, Jessica B, PA-C  doxycycline (VIBRAMYCIN) 100 MG capsule Take 1 capsule  (100 mg total) by mouth 2 (two) times daily. 01/27/18   Gwyneth SproutPlunkett, Whitney, MD  hydrocortisone (ANUSOL-HC) 2.5 % rectal cream Apply rectally 2 times daily 01/27/18   Gwyneth SproutPlunkett, Whitney, MD  ibuprofen (ADVIL,MOTRIN) 600 MG tablet Take 1 tablet (600 mg total) by mouth every 6 (six) hours. Patient not taking: Reported on 01/21/2018 06/08/17   Minott, Lynnae PrudeKeriann S, MD  ibuprofen (ADVIL,MOTRIN) 600 MG tablet Take 1 tablet (600 mg total) by mouth every 6 (six) hours as needed. Patient not taking: Reported on 01/21/2018 12/07/17   Raelyn Moraawson, Rolitta, CNM  medroxyPROGESTERone (PROVERA) 5 MG tablet Take 1 tablet (5 mg total) by mouth daily. 01/22/18   Rise MuLeaphart, Kenneth T, PA-C  nystatin cream (MYCOSTATIN) Apply to affected area 2 times daily Patient not taking: Reported on 01/21/2018 07/06/17   Marny LowensteinWenzel, Julie N, PA-C  ondansetron (ZOFRAN ODT) 4 MG disintegrating tablet Take 1 tablet (4 mg total) by mouth every 8 (eight) hours as needed for nausea or vomiting. 01/22/18   Rise MuLeaphart, Kenneth T, PA-C  sertraline (ZOLOFT) 25 MG tablet Take 1 tablet (25 mg total) by mouth daily. Patient not taking: Reported on 01/21/2018 07/05/17   Marny LowensteinWenzel, Julie N, PA-C  triamcinolone cream (KENALOG) 0.1 % Apply 1  application topically 2 (two) times daily. Patient not taking: Reported on 01/21/2018 07/24/17   Gregary CromerMitchell, Jessica B, PA-C    Family History Family History  Problem Relation Age of Onset  . Cancer Maternal Aunt   . Cancer Maternal Uncle   . Cancer Maternal Grandmother     Social History Social History   Tobacco Use  . Smoking status: Former Smoker    Packs/day: 0.25    Types: Cigarettes  . Smokeless tobacco: Never Used  Substance Use Topics  . Alcohol use: Yes  . Drug use: No     Allergies   Patient has no known allergies.   Review of Systems Review of Systems  All other systems reviewed and are negative.    Physical Exam Updated Vital Signs BP 124/77   Pulse 64   Resp 14   SpO2 100%   Physical  Exam Vitals signs and nursing note reviewed.    22 year old female, resting comfortably and in no acute distress. Vital signs are normal. Oxygen saturation is 100%, which is normal. Head is normocephalic and atraumatic. PERRLA, EOMI. Oropharynx is clear. Neck is nontender and supple without adenopathy or JVD. Back is nontender and there is no CVA tenderness. Lungs are clear without rales, wheezes, or rhonchi. Chest is nontender. Heart has regular rate and rhythm without murmur. Abdomen is soft, flat, nontender without masses or hepatosplenomegaly and peristalsis is normoactive. Extremities have no cyanosis or edema, full range of motion is present. Skin is warm and dry without rash. Neurologic: Mental status is normal, cranial nerves are intact, there are no motor or sensory deficits.  ED Treatments / Results  Labs (all labs ordered are listed, but only abnormal results are displayed) Labs Reviewed  COMPREHENSIVE METABOLIC PANEL  ETHANOL  CBC  RAPID URINE DRUG SCREEN, HOSP PERFORMED  I-STAT BETA HCG BLOOD, ED ([facility], [facility], [facility] ONLY)    EKG EKG Interpretation  Date/Time:  Tuesday August 01 2018 02:20:01 EST Ventricular Rate:  65 PR Interval:    QRS Duration: 88 QT Interval:  390 QTC Calculation: 406 R Axis:   59 Text Interpretation:  Sinus rhythm Normal ECG When compared with ECG of 11/19/2014, No significant change was found Confirmed by Dione BoozeGlick, Kaislee Chao ([id number]) on 08/01/2018 2:41:37 AM   EKG Interpretation  Date/Time:  Tuesday August 01 2018 07:38:24 EST Ventricular Rate:  79 PR Interval:    QRS Duration: 81 QT Interval:  348 QTC Calculation: 399 R Axis:   57 Text Interpretation:  Sinus rhythm Normal ECG When compared with ECG of EARLIER SAME DATE No significant change was found QT interval is unchanged Confirmed by Dione BoozeGlick, Shanard Treto ([id number]) on 08/01/2018 7:44:59 AM      Procedures Procedures   Medications Ordered in ED Medications  potassium chloride SA (K-DUR,KLOR-CON)  CR tablet 40 mEq (has no administration in time range)  ondansetron (ZOFRAN) injection 4 mg (4 mg Intravenous Given 08/01/18 0256)   Initial Impression / Assessment and Plan / ED Course  I have reviewed the triage vital signs and the nursing notes.  Pertinent lab results that were available during my care of the patient were reviewed by me and considered in my medical decision making (see chart for details).  Accidental overdose of sertraline.  This is likely a nontoxic ingestion.  Poison control has been consulted and recommended observation for 4 hours looking for GI upset or QT prolongation.  Initial ECG shows no evidence of QT prolongation.  Old records are reviewed, and she has  no relevant past visits.  7:49 AM She has been observed in the ED and her course here has been uneventful.  ECG is repeated showing no prolongation of QT interval.  She is felt to be safe for discharge.  She is given mental health resource guide and advised that she should be seeing a mental health professional regarding her depression.  Final Clinical Impressions(s) / ED Diagnoses   Final diagnoses:  Accidental drug overdose, initial encounter    ED Discharge Orders    None       Dione Booze, MD 08/01/18 [phone]

## 2018-08-01 NOTE — ED Triage Notes (Addendum)
Patient presents via EMS from home with complaints of an "unintentional" overdose. Patient states that she got into an argument with her mother. Patient states that she is supposed to take 1-2 of her zoloft depending on how she feels. Patient states she felt really bad so she took 10. Patient denies SI/HI. Poison Control contacted by EMS. Recommended obs for GI upset and lengthening of QT.

## 2018-08-01 NOTE — ED Notes (Signed)
Bed: WA04 Expected date:  Expected time:  Means of arrival:  Comments: Unintentional overdose

## 2018-10-27 IMAGING — CT CT ABD-PELV W/ CM
2 of 4 series · 16 of 46 positions shown, 18 images · IV contrast (omnipaque)
Comparison: Pelvic ultrasound January 22, 2018

CLINICAL DATA: Lower abdominal pain radiating to back. Vaginal
bleeding. Nausea, vomiting and diarrhea.

EXAM:
CT ABDOMEN AND PELVIS WITH CONTRAST
TECHNIQUE: Multidetector CT imaging of the abdomen and pelvis was performed
using the standard protocol following bolus administration of
intravenous contrast.
CONTRAST:  100mL OMNIPAQUE IOHEXOL 300 MG/ML  SOLN

[Series 3: abdomen 5.0 · axial · 0.75mm/px · z∈[+1010,+1415]mm · 13 of 91 slices shown, 15 images]
[im 5/91  soft-tissue]
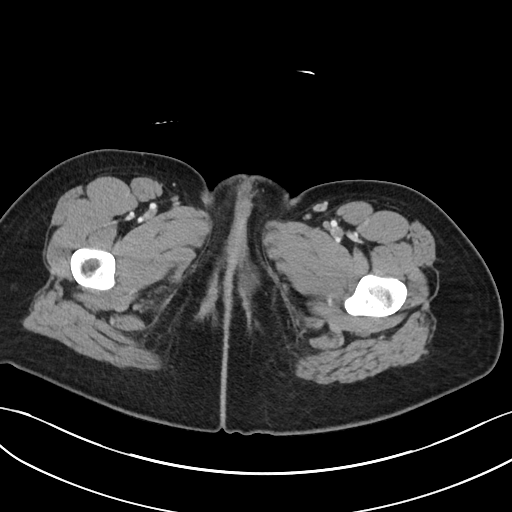
[im 5/91  bone]
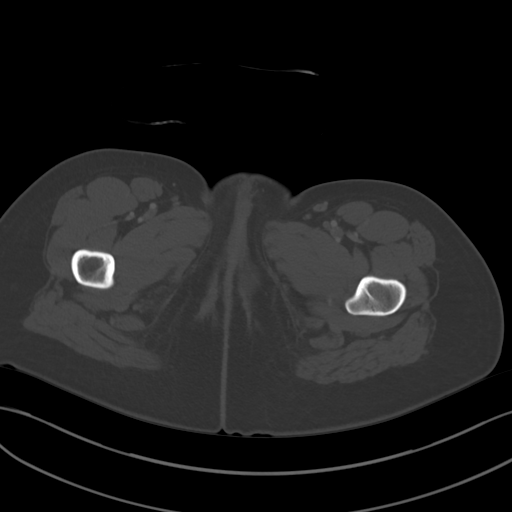
[im 13/91  soft-tissue]
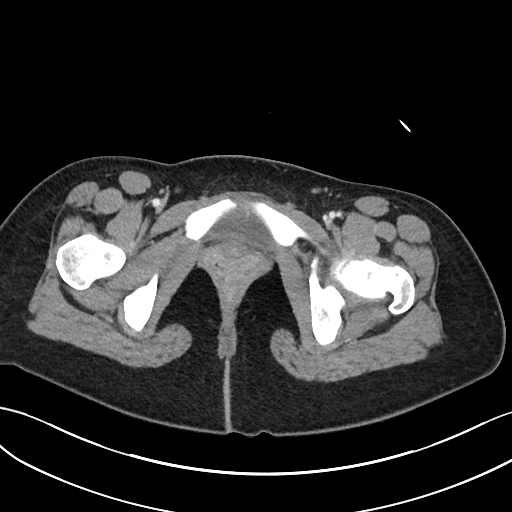
[im 21/91  soft-tissue]
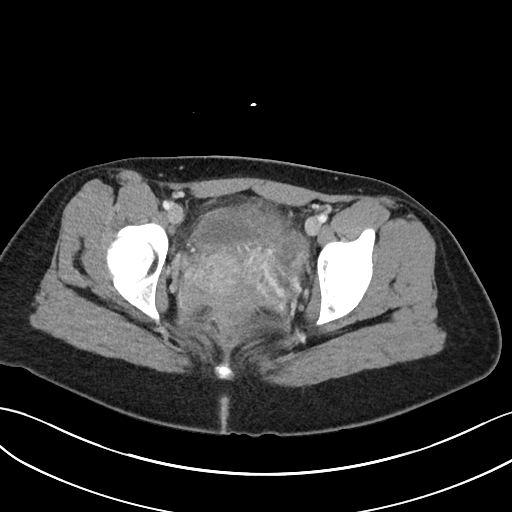
[im 25/91  soft-tissue]
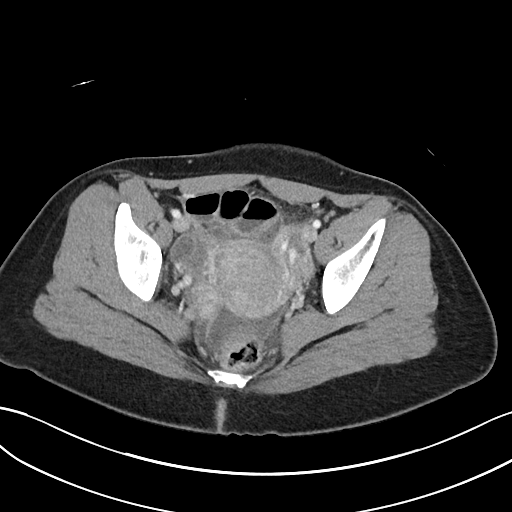
[im 33/91  soft-tissue]
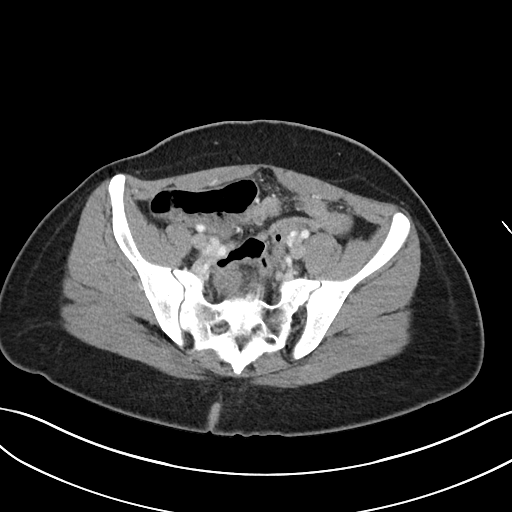
[im 37/91  soft-tissue]
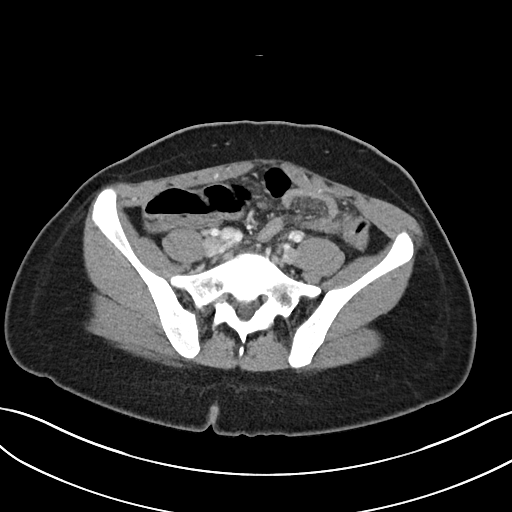
[im 46/91  soft-tissue]
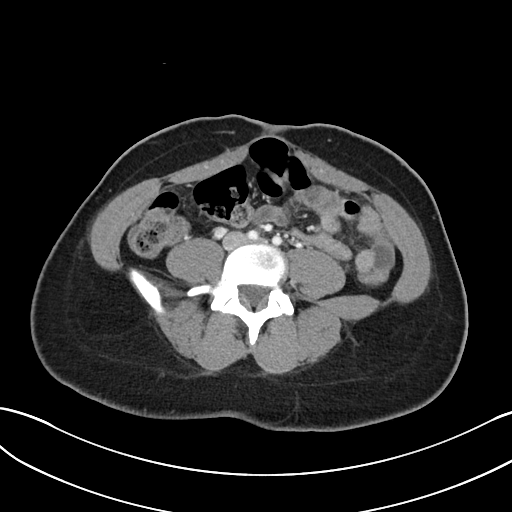
[im 54/91  soft-tissue]
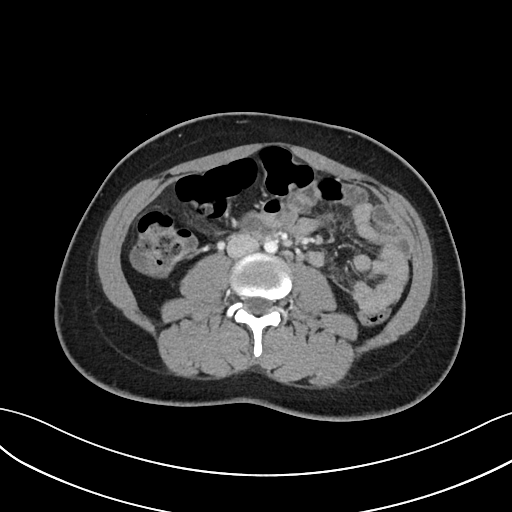
[im 58/91  soft-tissue]
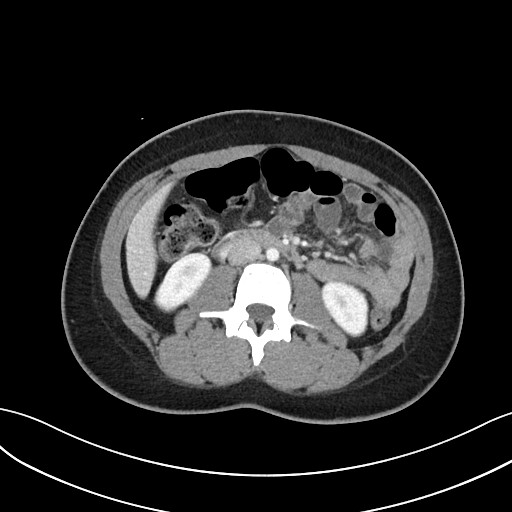
[im 58/91  bone]
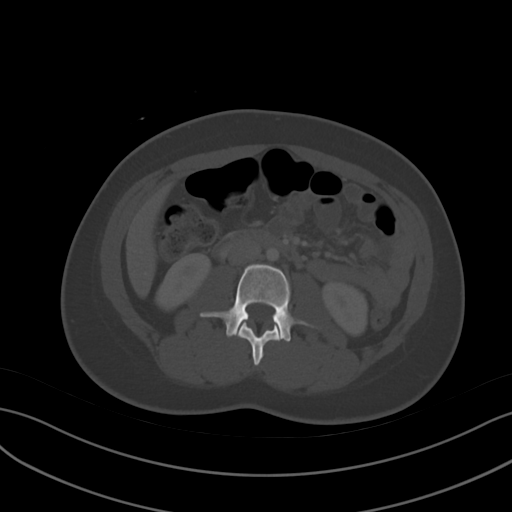
[im 66/91  soft-tissue]
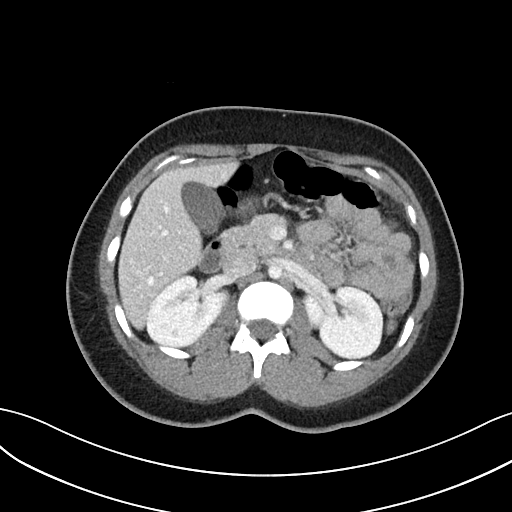
[im 70/91  soft-tissue]
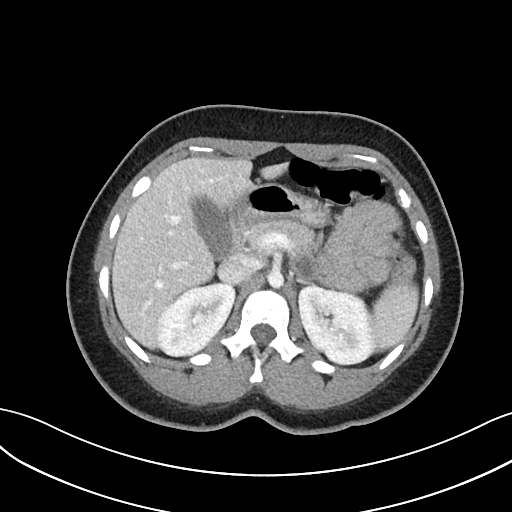
[im 78/91  soft-tissue]
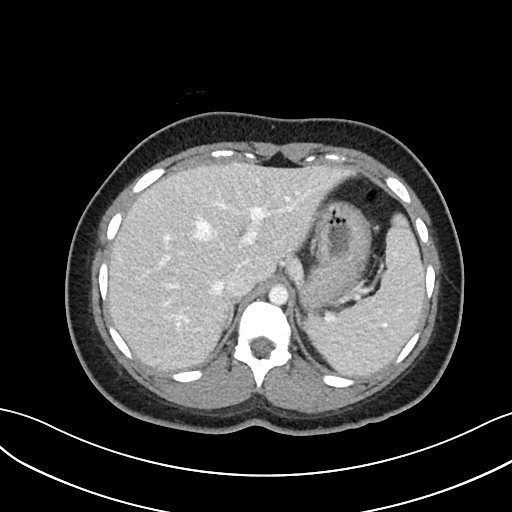
[im 86/91  soft-tissue]
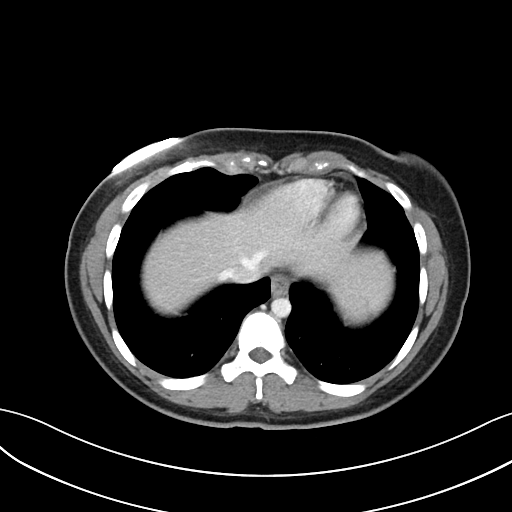

[Series 6: abdomen 3.0 mpr cor · coronal · 0.76mm/px · 3 of 77 slices shown]
[im 26/77  soft-tissue]
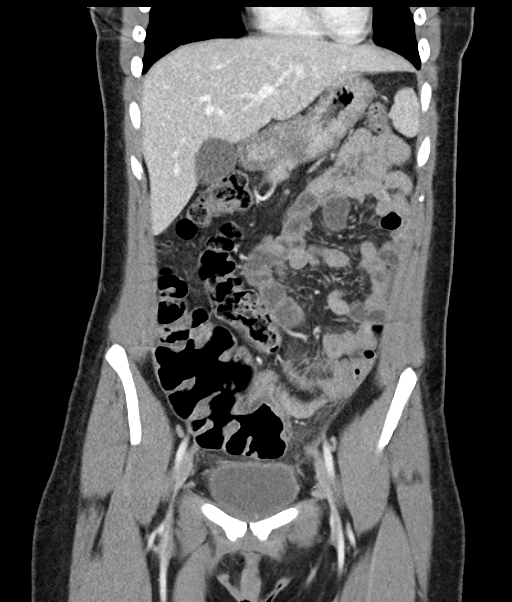
[im 34/77  soft-tissue]
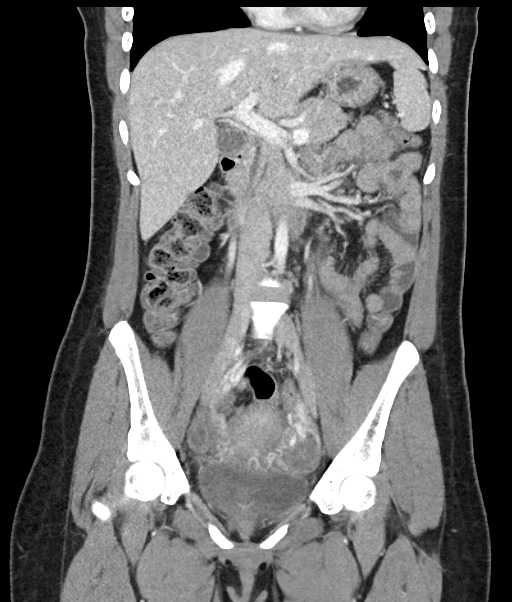
[im 43/77  soft-tissue]
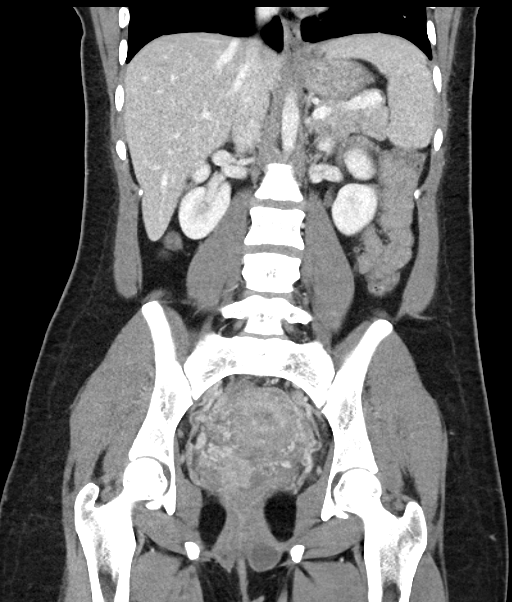

[16 of 46 positions shown; findings below may reference images not displayed]

FINDINGS: LOWER CHEST: Lung bases are clear. Included heart size is normal. No
pericardial effusion.

HEPATOBILIARY: Liver and gallbladder are normal.

PANCREAS: Normal.

SPLEEN: Normal.

ADRENALS/URINARY TRACT: Kidneys are orthotopic, demonstrating
symmetric enhancement. No nephrolithiasis, hydronephrosis or solid
renal masses. The unopacified ureters are normal in course and
caliber. Urinary bladder is partially distended and unremarkable.
Normal adrenal glands.

STOMACH/BOWEL: The stomach, small and large bowel are normal in
course and caliber without inflammatory changes. Normal appendix.

VASCULAR/LYMPHATIC: Aortoiliac vessels are normal in course and
caliber. Prominent pelvic and ovarian veins. No lymphadenopathy by
CT size criteria.

REPRODUCTIVE: Mild fat stranding about the uterus and adnexa.

OTHER: Small volume free fluid in the pelvis, likely physiologic.

MUSCULOSKELETAL: Nonacute. Mild rectus abdominis diastasis. 22 mm
LEFT Bartholin cyst.
IMPRESSION: 1. Small volume free fluid in the pelvis and mild pelvic
non-specific fat stranding, PID can have this appearance.
2. Prominent pelvic and ovarian veins seen with pelvic congestion
syndrome.

## 2019-04-25 ENCOUNTER — Other Ambulatory Visit: Payer: Self-pay

## 2019-04-25 ENCOUNTER — Emergency Department (HOSPITAL_COMMUNITY)
Admission: EM | Admit: 2019-04-25 | Discharge: 2019-04-26 | Disposition: A | Discharge status: Home / Self Care | Payer: Medicaid Other | Attending: Emergency Medicine | Admitting: Emergency Medicine

## 2019-04-25 DIAGNOSIS — Z87891 Personal history of nicotine dependence: Secondary | ICD-10-CM | POA: Insufficient documentation

## 2019-04-25 DIAGNOSIS — R21 Rash and other nonspecific skin eruption: Secondary | ICD-10-CM | POA: Diagnosis not present

## 2019-04-25 DIAGNOSIS — R42 Dizziness and giddiness: Secondary | ICD-10-CM | POA: Insufficient documentation

## 2019-04-25 DIAGNOSIS — K625 Hemorrhage of anus and rectum: Secondary | ICD-10-CM | POA: Insufficient documentation

## 2019-04-25 DIAGNOSIS — N3000 Acute cystitis without hematuria: Secondary | ICD-10-CM | POA: Diagnosis not present

## 2019-04-25 DIAGNOSIS — K5901 Slow transit constipation: Secondary | ICD-10-CM | POA: Diagnosis not present

## 2019-04-25 DIAGNOSIS — R1032 Left lower quadrant pain: Secondary | ICD-10-CM | POA: Insufficient documentation

## 2019-04-25 LAB — I-STAT BETA HCG BLOOD, ED (MC, WL, AP ONLY): I-stat hCG, quantitative: <5 m[IU]/mL (ref <5)

## 2019-04-25 NOTE — ED Triage Notes (Signed)
Patient reports she has a rash on her chest and has been having abdominal crapmping. Pt reports the cramping started last night. Patient also reports blood in her stool since last Sunday. Patient reports it is both bright red and dark red.

## 2019-04-25 NOTE — ED Notes (Signed)
Pt ambulatory from triage to room 
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
## 2019-04-26 ENCOUNTER — Encounter: Payer: Self-pay | Admitting: Physician Assistant

## 2019-04-26 ENCOUNTER — Encounter (HOSPITAL_COMMUNITY): Payer: Self-pay

## 2019-04-26 ENCOUNTER — Emergency Department (HOSPITAL_COMMUNITY): Payer: Medicaid Other

## 2019-04-26 LAB — CBC WITH DIFFERENTIAL/PLATELET
Abs Immature Granulocytes: 0 10*3/uL (ref 0.00–0.07)
Basophils Absolute: 0 10*3/uL (ref 0.0–0.1)
Basophils Relative: 0 %
Eosinophils Absolute: 0.1 10*3/uL (ref 0.0–0.5)
Eosinophils Relative: 2 %
HCT: 39.2 % (ref 36.0–46.0)
Hemoglobin: 12.5 g/dL (ref 12.0–15.0)
Immature Granulocytes: 0 %
Lymphocytes Relative: 52 %
Lymphs Abs: 2.5 10*3/uL (ref 0.7–4.0)
MCH: 27.2 pg (ref 26.0–34.0)
MCHC: 31.9 g/dL (ref 30.0–36.0)
MCV: 85.4 fL (ref 80.0–100.0)
Monocytes Absolute: 0.3 10*3/uL (ref 0.1–1.0)
Monocytes Relative: 7 %
Neutro Abs: 1.9 10*3/uL (ref 1.7–7.7)
Neutrophils Relative %: 39 %
Platelets: 201 10*3/uL (ref 150–400)
RBC: 4.59 MIL/uL (ref 3.87–5.11)
RDW: 14.7 % (ref 11.5–15.5)
WBC: 4.8 10*3/uL (ref 4.0–10.5)
nRBC: 0 % (ref 0.0–0.2)

## 2019-04-26 LAB — URINALYSIS, ROUTINE W REFLEX MICROSCOPIC
Bilirubin Urine: NEGATIVE
Glucose, UA: NEGATIVE mg/dL
Hgb urine dipstick: NEGATIVE
Ketones, ur: NEGATIVE mg/dL
Nitrite: NEGATIVE
Protein, ur: NEGATIVE mg/dL
Specific Gravity, Urine: 1.027 (ref 1.005–1.030)
pH: 5 (ref 5.0–8.0)

## 2019-04-26 LAB — COMPREHENSIVE METABOLIC PANEL
ALT: 12 U/L (ref 0–44)
AST: 16 U/L (ref 15–41)
Albumin: 4.4 g/dL (ref 3.5–5.0)
Alkaline Phosphatase: 57 U/L (ref 38–126)
Anion gap: 10 (ref 5–15)
BUN: 10 mg/dL (ref 6–20)
CO2: 26 mmol/L (ref 22–32)
Calcium: 9.4 mg/dL (ref 8.9–10.3)
Chloride: 104 mmol/L (ref 98–111)
Creatinine, Ser: 0.71 mg/dL (ref 0.44–1.00)
GFR calc Af Amer: >60 mL/min (ref >60)
GFR calc non Af Amer: >60 mL/min (ref >60)
Glucose, Bld: 73 mg/dL (ref 70–99)
Potassium: 3.5 mmol/L (ref 3.5–5.1)
Sodium: 140 mmol/L (ref 135–145)
Total Bilirubin: 0.3 mg/dL (ref 0.3–1.2)
Total Protein: 7.5 g/dL (ref 6.5–8.1)

## 2019-04-26 LAB — POC OCCULT BLOOD, ED: Fecal Occult Bld: NEGATIVE

## 2019-04-26 MED ORDER — HYDROXYZINE HCL 25 MG PO TABS: 25.0000 mg | ORAL_TABLET | Freq: Four times a day (QID) | ORAL | 0 refills | Status: DC

## 2019-04-26 MED ORDER — IOHEXOL 300 MG/ML  SOLN
100.0000 mL | Freq: Once | INTRAMUSCULAR | Status: AC | PRN
  Administered 2019-04-26: 01:00:00 100 mL via INTRAVENOUS

## 2019-04-26 MED ORDER — CEPHALEXIN 500 MG PO CAPS: 500.0000 mg | ORAL_CAPSULE | Freq: Four times a day (QID) | ORAL | 0 refills | Status: DC

## 2019-04-26 MED ORDER — TRIAMCINOLONE ACETONIDE 0.025 % EX OINT: 1.0000 "application " | TOPICAL_OINTMENT | Freq: Two times a day (BID) | CUTANEOUS | 0 refills | Status: DC

## 2019-04-26 NOTE — Discharge Instructions (Addendum)
Thank you for allowing me to care for you today in the Emergency Department.   Please call to schedule a follow-up appointment with gastroenterology regarding the blood that you have had in your stool and abdominal pain that is likely secondary to constipation.  MiraLAX is available over-the-counter.  Start taking this once daily to help with constipation.  Your urine was also concerning for infection.  Keflex is an antibiotic.  Take 1 tablet by mouth every 6 hours as needed for the next 5 days.  Apply a thin layer of triamcinolone ointment to the rash on your chest wall up to 2 times daily.  Make sure you stop using this ointment after the rash resolves because long-term use is not indicated.  For itching related to the rash, you can take 1 tablet of hydroxyzine by mouth every 6 hours.  Sometimes this medication can make you drowsy so do not work or drive until you know how it impacts you.  Do not take it with other sedating substances.  Return to the emergency department if you develop significantly worsening rectal bleeding accompanied by passing out, severe shortness of breath, persistent vomiting, high fevers, uncontrollable abdominal pain, or other new, concerning symptoms.

## 2019-04-26 NOTE — ED Notes (Signed)
Pt transported to CT ?

## 2019-04-27 LAB — URINE CULTURE: Culture: <10000 — AB

## 2019-05-08 ENCOUNTER — Ambulatory Visit: Payer: Medicaid Other | Admitting: Physician Assistant

## 2019-05-23 IMAGING — US US ABDOMEN COMPLETE
1 series · 14 of 25 positions shown · non-contrast
Comparison: None.

CLINICAL DATA: Lower abdominal pain for 1 day.

EXAM:
ABDOMEN ULTRASOUND COMPLETE

[Series 1: us abdomen complete · 0.23mm/px · 14 of 95 slices shown]
[im 1/95]
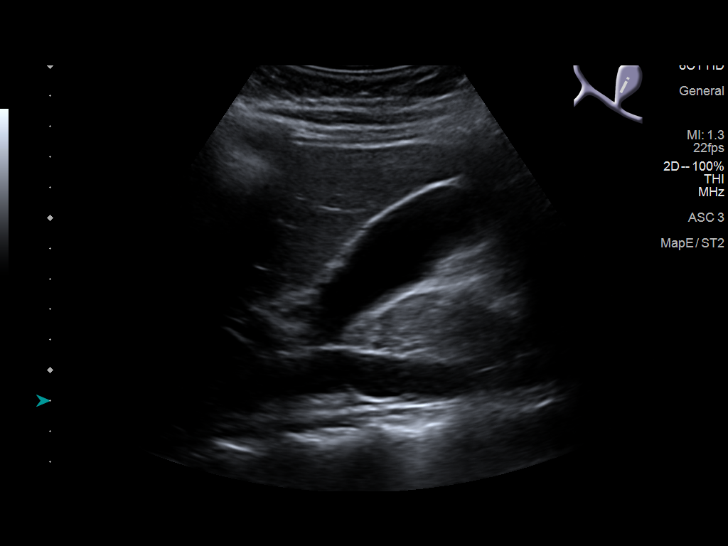
[im 8/95]
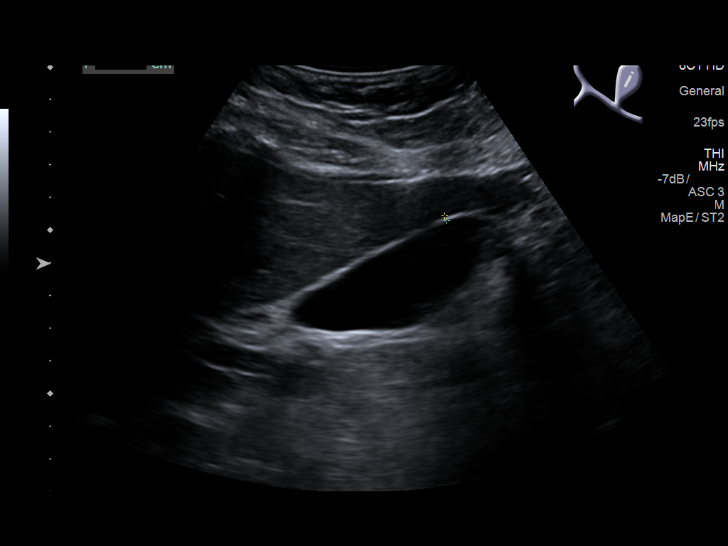
[im 16/95]
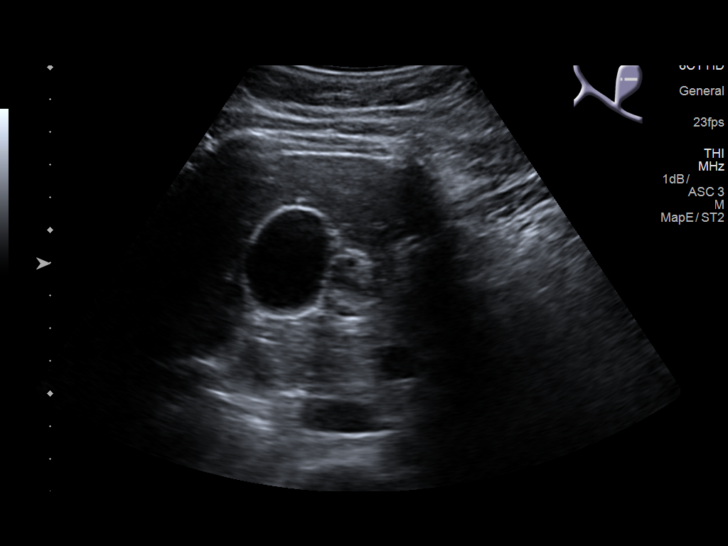
[im 24/95]
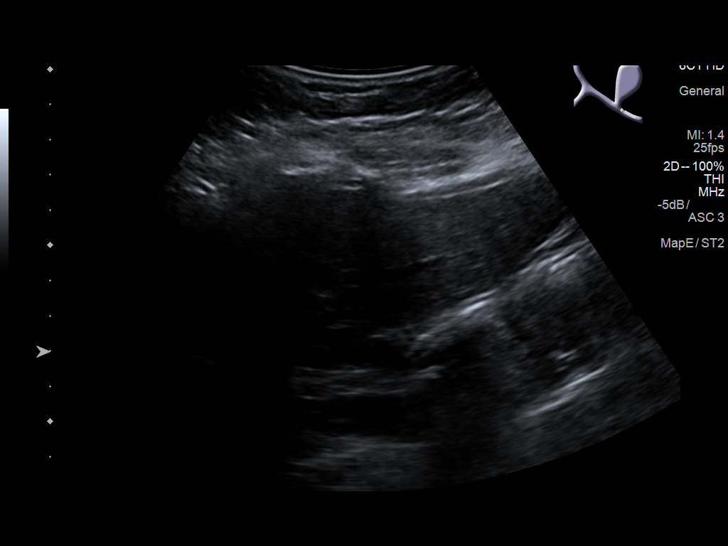
[im 32/95]
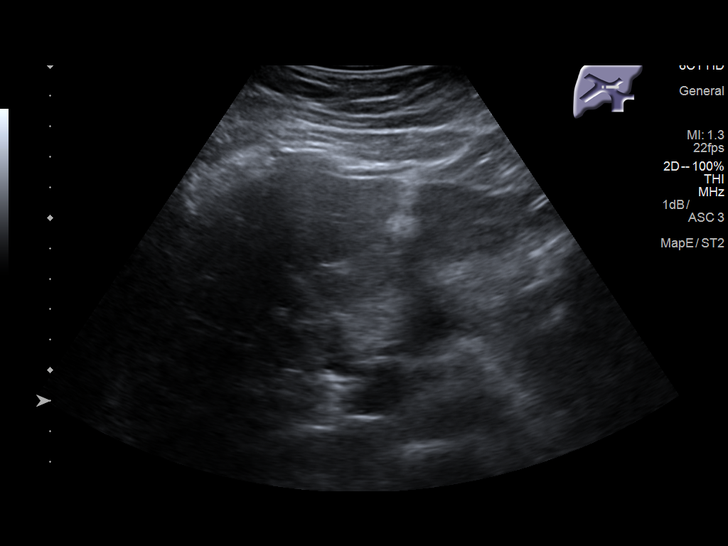
[im 36/95]
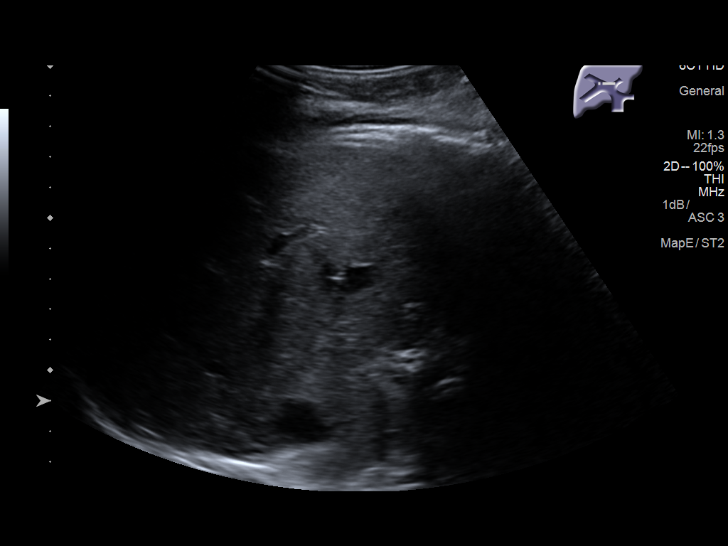
[im 44/95]
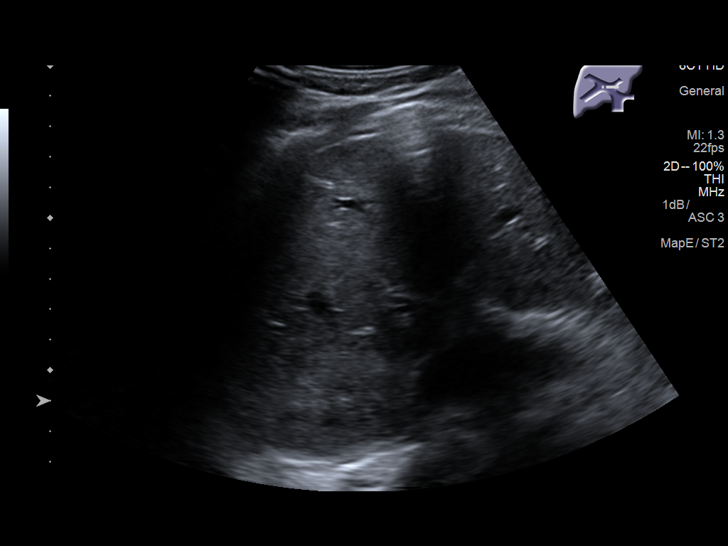
[im 51/95]
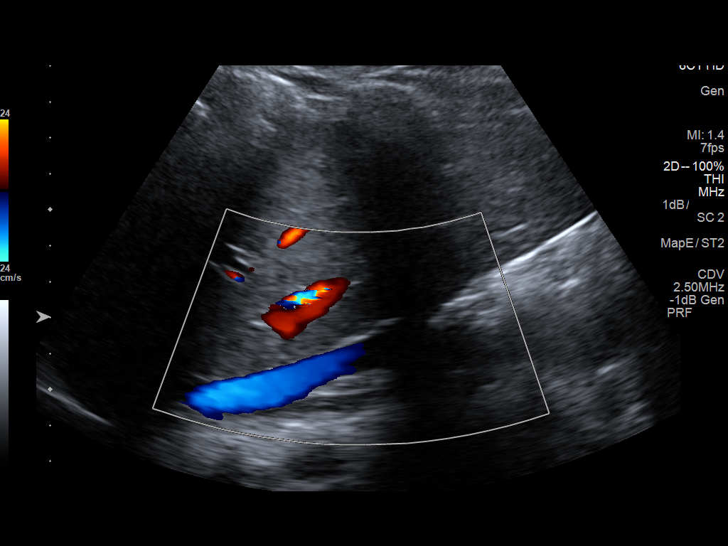
[im 59/95]
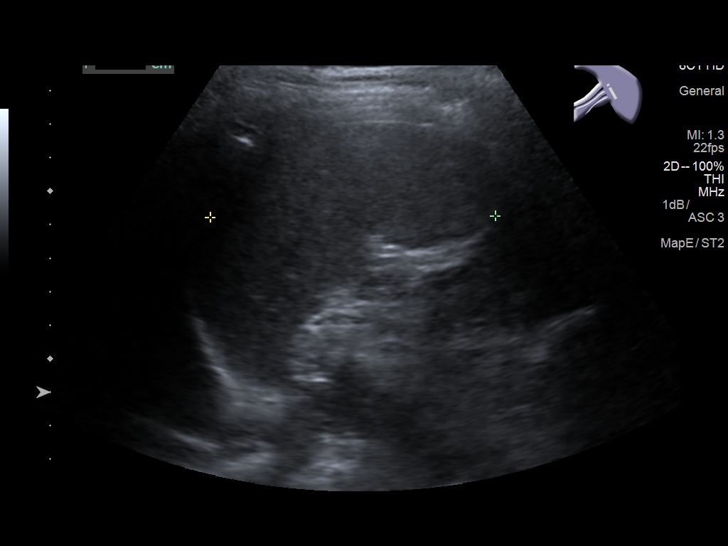
[im 63/95]
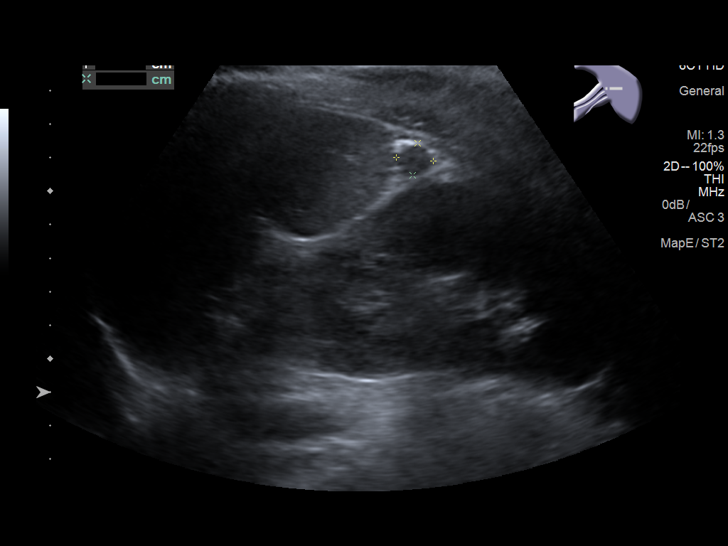
[im 71/95]
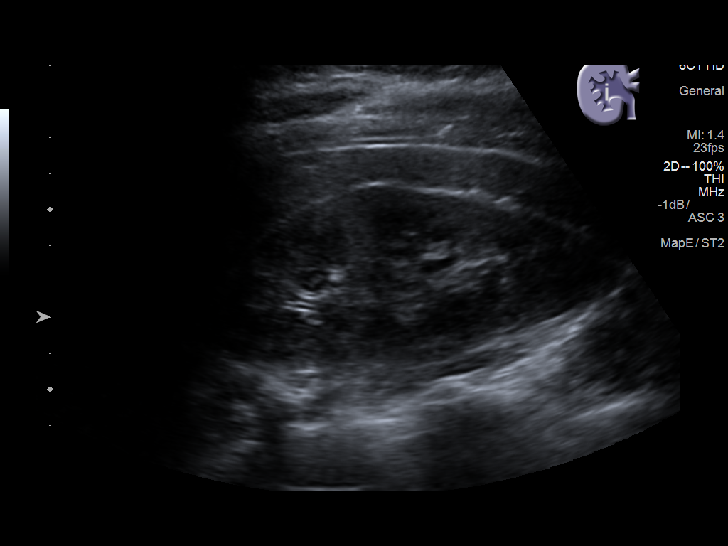
[im 79/95]
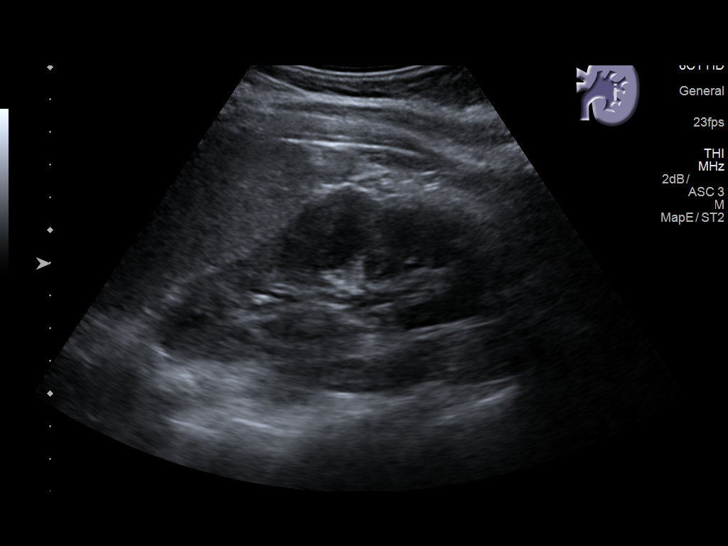
[im 87/95]
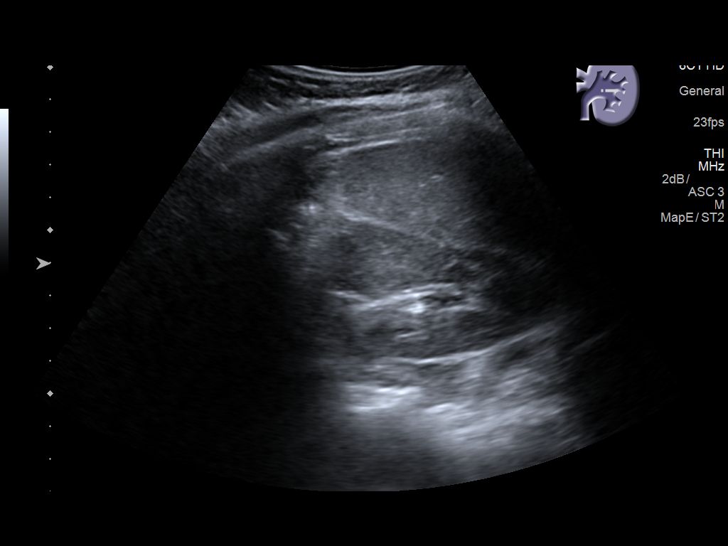
[im 95/95]
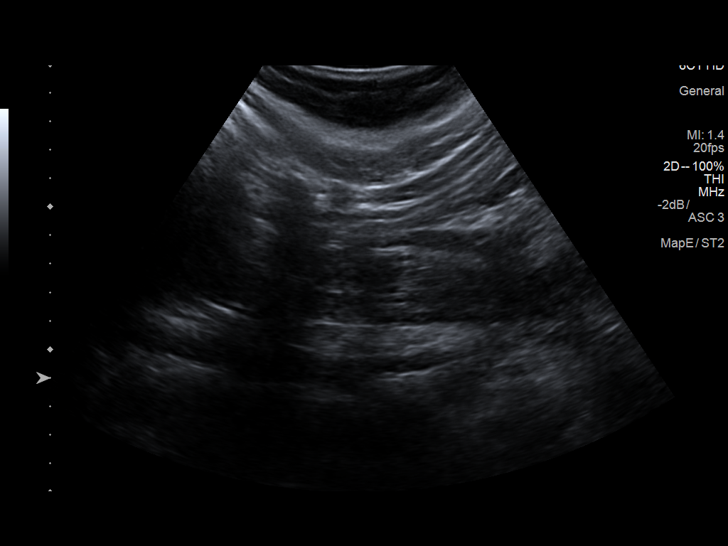

[14 of 25 positions shown; findings below may reference images not displayed]

FINDINGS: Gallbladder: Physiologically distended. No gallstones or wall
thickening visualized. No sonographic Murphy sign noted by
sonographer.

Common bile duct: Diameter: 2 mm, normal.

Liver: No focal lesion identified. Within normal limits in
parenchymal echogenicity. Portal vein is patent on color Doppler
imaging with normal direction of blood flow towards the liver.

IVC: No abnormality visualized.

Pancreas: Visualized portion unremarkable.

Spleen: Size and appearance within normal limits. Adjacent splenule.

Right Kidney: Length: 11.5 cm. Echogenicity within normal limits. No
mass or hydronephrosis visualized.

Left Kidney: Length: 11.7 cm. Echogenicity within normal limits. No
mass or hydronephrosis visualized.

Abdominal aorta: No aneurysm visualized.

Other findings: None.  No ascites.
IMPRESSION: Normal abdominal ultrasound.

## 2019-08-19 ENCOUNTER — Other Ambulatory Visit: Payer: Self-pay

## 2019-08-19 ENCOUNTER — Emergency Department (HOSPITAL_COMMUNITY): Payer: Medicaid Other

## 2019-08-19 ENCOUNTER — Emergency Department (HOSPITAL_COMMUNITY)
Admission: EM | Admit: 2019-08-19 | Discharge: 2019-08-19 | Disposition: A | Discharge status: Home / Self Care | Payer: Medicaid Other | Attending: Emergency Medicine | Admitting: Emergency Medicine

## 2019-08-19 DIAGNOSIS — Y92410 Unspecified street and highway as the place of occurrence of the external cause: Secondary | ICD-10-CM | POA: Insufficient documentation

## 2019-08-19 DIAGNOSIS — Z87891 Personal history of nicotine dependence: Secondary | ICD-10-CM | POA: Insufficient documentation

## 2019-08-19 DIAGNOSIS — S199XXA Unspecified injury of neck, initial encounter: Secondary | ICD-10-CM | POA: Diagnosis present

## 2019-08-19 DIAGNOSIS — M549 Dorsalgia, unspecified: Secondary | ICD-10-CM | POA: Insufficient documentation

## 2019-08-19 DIAGNOSIS — S161XXA Strain of muscle, fascia and tendon at neck level, initial encounter: Secondary | ICD-10-CM

## 2019-08-19 DIAGNOSIS — Z79899 Other long term (current) drug therapy: Secondary | ICD-10-CM | POA: Diagnosis not present

## 2019-08-19 DIAGNOSIS — Y9389 Activity, other specified: Secondary | ICD-10-CM | POA: Insufficient documentation

## 2019-08-19 DIAGNOSIS — F410 Panic disorder [episodic paroxysmal anxiety] without agoraphobia: Secondary | ICD-10-CM | POA: Insufficient documentation

## 2019-08-19 DIAGNOSIS — Y999 Unspecified external cause status: Secondary | ICD-10-CM | POA: Diagnosis not present

## 2019-08-19 MED ORDER — METHOCARBAMOL 500 MG PO TABS: 500.0000 mg | ORAL_TABLET | Freq: Two times a day (BID) | ORAL | 0 refills | Status: DC
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
## 2019-08-19 NOTE — ED Notes (Signed)
Pt placed in Medium C-Collar per RN.

## 2019-08-19 NOTE — ED Notes (Signed)
Patient transported to CT 

## 2019-08-28 ENCOUNTER — Other Ambulatory Visit: Payer: Self-pay

## 2019-08-28 ENCOUNTER — Ambulatory Visit: Payer: Medicaid Other | Attending: Nurse Practitioner | Admitting: Physical Therapy

## 2019-08-28 ENCOUNTER — Encounter: Payer: Self-pay | Admitting: Physical Therapy

## 2019-08-28 DIAGNOSIS — M542 Cervicalgia: Secondary | ICD-10-CM | POA: Insufficient documentation

## 2019-08-28 DIAGNOSIS — M545 Low back pain, unspecified: Secondary | ICD-10-CM

## 2019-08-28 DIAGNOSIS — M6281 Muscle weakness (generalized): Secondary | ICD-10-CM | POA: Insufficient documentation

## 2019-08-28 DIAGNOSIS — R252 Cramp and spasm: Secondary | ICD-10-CM | POA: Diagnosis present

## 2019-08-28 NOTE — Patient Instructions (Signed)
Scapular retraction x 10  Shoulder rolls x 10  Supine cervical rotation x 10 Supine lower trunk rotation with head turns x 10  Go gently, 2 x per day
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
## 2019-09-04 ENCOUNTER — Other Ambulatory Visit: Payer: Self-pay

## 2019-09-04 ENCOUNTER — Ambulatory Visit: Payer: Medicaid Other | Admitting: Physical Therapy

## 2019-09-04 ENCOUNTER — Encounter: Payer: Self-pay | Admitting: Physical Therapy

## 2019-09-04 DIAGNOSIS — M545 Low back pain, unspecified: Secondary | ICD-10-CM

## 2019-09-04 DIAGNOSIS — M542 Cervicalgia: Secondary | ICD-10-CM

## 2019-09-04 DIAGNOSIS — M6281 Muscle weakness (generalized): Secondary | ICD-10-CM

## 2019-09-04 DIAGNOSIS — R252 Cramp and spasm: Secondary | ICD-10-CM

## 2019-09-04 NOTE — Therapy (Signed)
[facility] [address] [place], [place], [address] Phone: [phone]   Fax:  [phone]  Physical Therapy Treatment  Patient Details  Name: Emily Cain MRN: [id number] Date of Birth: 05/21/1996 Referring Provider (PT): Eyvonne Mechanic, NP   Encounter Date: 09/04/2019  PT End of Session - 09/04/19 1545    Visit Number  2    Number of Visits  16    Date for PT Re-Evaluation  10/23/19    Authorization Type  MCD    Authorization - Visit Number  1    Authorization - Number of Visits  16    PT Start Time  1533    PT Stop Time  1615    PT Time Calculation (min)  42 min    Activity Tolerance  Patient limited by pain    Behavior During Therapy  [facility] for tasks assessed/performed       Past Medical History:  Diagnosis Date  . Anemia   . Environmental allergies   . History of IUFD     Past Surgical History:  Procedure Laterality Date  . NO PAST SURGERIES      There were no vitals filed for this visit.  Subjective Assessment - 09/04/19 1538    Subjective  Feel like ribs are really sore.  Tried to some of the exercises.  The cold and electricity did not help.  I am trying to get a referral for my nerves. Hasnt had XR yet.    Diagnostic tests  cervical CT neg, XR lumbar upcoming    Currently in Pain?  Yes    Pain Score  8     Pain Location  Back    Pain Orientation  Lower    Pain Descriptors / Indicators  Tightness;Tingling;Aching    Pain Type  Acute pain    Pain Onset  1 to 4 weeks ago    Pain Frequency  Constant    Pain Score  8    Pain Location  Neck    Pain Orientation  Left    Pain Descriptors / Indicators  Spasm    Pain Type  Acute pain    Pain Onset  1 to 4 weeks ago    Pain Frequency  Constant         [facility] PT Assessment - 09/04/19 0001      Strength   Right Hip ABduction  3/5    Left Hip ABduction  3/5       [facility] Adult PT Treatment/Exercise - 09/04/19 0001      Lumbar Exercises: Stretches   Single Knee to Chest Stretch  3 reps    Single Knee to Chest Stretch Limitations  pain in L anterior hip     Lower Trunk Rotation  10 seconds    Lower Trunk Rotation Limitations  x 10 , 1 set knees wide      Pelvic Tilt  10 reps    Pelvic Tilt Limitations  "hurts my ribs"      Lumbar Exercises: Aerobic   Nustep  L4 UE and LE 6 min       Lumbar Exercises: Supine   Ab Set  10 reps    AB Set Limitations  encouraged belly breathing for rib    Clam  10 reps    Clam Limitations  bilateral       Lumbar Exercises: Quadruped   Other Quadruped Lumbar Exercises  quadruped rocking x 10 , pain and  Moist Heat Therapy   Number Minutes Moist Heat  15 Minutes    Moist Heat Location  Lumbar Spine             PT Education - 09/04/19 1555    Education Details  positive effects of exercise, getting outside, movement and pain    Person(s) Educated  Patient    Methods  Explanation    Comprehension  Verbalized understanding       PT Short Term Goals - 08/29/19 1014      PT SHORT TERM GOAL #1   Title  Pt will be I with HEP for pain relief, gentle ROM and stretching neck and low back/trunk.    Baseline  given on eval    Time  4    Period  Weeks    Status  New    Target Date  09/25/19      PT SHORT TERM GOAL #2   Title  Pt will show improved cervical flexion and extension and rotation to allow reading, scanning environment with 25% less pain    Baseline  limited flexion to 50 deg, ext to 20 deg and rotation limited about 25 % all with pain    Time  4    Period  Weeks    Status  New    Target Date  09/25/19      PT SHORT TERM GOAL #3   Title  Pt will be able to sit and stand for 20-30 min at a time with pain <5/10    Baseline  10 min, pain >7/10    Time  4    Period  Weeks    Status  New    Target Date  09/25/19      PT SHORT TERM GOAL #4   Title  Pt will properly get up and down from sit to supine in order to reduce pain and strain on spine.    Baseline  needs cues, very  difficult for patient, pain severe    Time  4    Period  Weeks    Status  New    Target Date  09/25/19        PT Long Term Goals - 08/29/19 1019      PT LONG TERM GOAL #1   Title  Pt will be able to sit comfortably for meals, playing with daughter with only min increase in neck and back pain    Baseline  unable > 5-10 min    Time  8    Period  Weeks    Status  New    Target Date  10/22/19      PT LONG TERM GOAL #2   Title  Pt will be able to lift, carry daughter as needed without increase in back or neck pain    Baseline  relies on mother to do this    Time  8    Period  Weeks    Status  New    Target Date  10/22/19      PT LONG TERM GOAL #3   Title  Pt will be I with HEP upon discharge from PT in order to maintain therapy benefit    Baseline  unknown    Time  8    Period  Weeks    Status  New    Target Date  10/22/19      PT LONG TERM GOAL #4   Title  Pt will be able to show  proper lifting techniques and body mechanics in order to perform job duties    Baseline  unknow, unable to work at present time    Time  [place]    Target Date  10/22/19            Plan - 09/04/19 1614    Clinical Impression Statement  Pt had limited tolerance for exercise today unless in supine.  She was encouraged to continue to gently move and breathe to relax ribs. Did not tolerate manual but heat worked better for her than ice.    Personal Factors and Comorbidities  Social Background    Examination-Activity Limitations  Bathing;Squat;Stairs;Lift;Bed Mobility;Bend;Locomotion Level;Stand;Carry;Sit;Sleep;Dressing    Examination-Participation Restrictions  Laundry;Interpersonal Relationship;Community Activity    PT Treatment/Interventions  ADLs/Self Care Home Management;Electrical Stimulation;Therapeutic activities;Patient/family education;Taping;Therapeutic exercise;Balance training;Passive range of motion;Neuromuscular re-education;Moist  Heat;Cryotherapy;Functional mobility training;Manual techniques;Dry needling;Ultrasound    PT Next Visit Plan  gentle exercise as able. L stand at counter, try manual to C spine    PT Home Exercise Plan  [email or web address]    Consulted and Agree with Plan of Care  Patient       Patient will benefit from skilled therapeutic intervention in order to improve the following deficits and impairments:  Increased fascial restricitons, Pain, Increased muscle spasms, Decreased mobility, Decreased activity tolerance, Decreased range of motion, Decreased strength, Hypomobility, Impaired UE functional use, Impaired flexibility, Increased edema, Decreased balance, Difficulty walking  Visit Diagnosis: Cervicalgia  Cramp and spasm  Muscle weakness (generalized)  Acute bilateral low back pain, unspecified whether sciatica present     Problem List Patient Active Problem List   Diagnosis Date Noted  . Abnormal menses 12/07/2017  . 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 History of IUFD 05/22/2016    Emily Cain 09/04/2019, 5:39 PM  [facility] [address] [place], [place], [address] Phone: [phone]   Fax:  [phone]  Name: Emily Cain MRN: [id number] Date of Birth: Jan 12, 1996  Raeford Razor, PT 09/04/19 5:40 PM Phone: [phone] Fax: [phone]

## 2019-09-04 NOTE — Patient Instructions (Signed)
Access Code: [email or web address]  URL: [email or web address]  Date: 09/04/2019  Prepared by: Karie Mainland   Exercises  Seated Scapular Retraction - 10 reps - 2 sets - 10 hold - 1x daily - 7x weekly  Shoulder Rolls in Sitting - 10 reps - 2 sets - 1x daily - 7x weekly  Seated Cervical Rotation AROM - 10 reps - 1 sets - 10 hold - 2x daily - 7x weekly  Supine Lower Trunk Rotation - 10 reps - 2 sets - 10 hold - 1x daily - 7x weekly  Cat to Child's Pose with Posterior Pelvic Tilt - 3 reps - 2 sets - 30 hold - 1x daily - 7x weekly  Standing 'L' Stretch at Counter - 3 reps - 2 sets - 30 hold - 1x daily - 7x weekly

## 2019-09-06 ENCOUNTER — Ambulatory Visit: Payer: Medicaid Other | Admitting: Physical Therapy

## 2019-09-06 ENCOUNTER — Encounter: Payer: Self-pay | Admitting: Physical Therapy

## 2019-09-06 ENCOUNTER — Other Ambulatory Visit: Payer: Self-pay

## 2019-09-06 DIAGNOSIS — R252 Cramp and spasm: Secondary | ICD-10-CM

## 2019-09-06 DIAGNOSIS — M545 Low back pain, unspecified: Secondary | ICD-10-CM

## 2019-09-06 DIAGNOSIS — M542 Cervicalgia: Secondary | ICD-10-CM | POA: Diagnosis not present

## 2019-09-06 DIAGNOSIS — M6281 Muscle weakness (generalized): Secondary | ICD-10-CM

## 2019-09-06 NOTE — Therapy (Signed)
[facility] Outpatient Rehabilitation [facility] [address] [place], [place], [address] Phone: [phone]   Fax:  [phone]  Physical Therapy Treatment  Patient Details  Name: Emily Cain MRN: [id number] Date of Birth: 08-Aug-1996 Referring Provider (PT): Eyvonne Mechanic, NP   Encounter Date: 09/06/2019  PT End of Session - 09/06/19 1528    Authorization Type  MCD    Authorization Time Period  09/03/19-09/16/19    Authorization - Visit Number  2    Authorization - Number of Visits  3    PT Start Time  1432    PT Stop Time  1540   49 min direct tx. time   PT Time Calculation (min)  68 min    Activity Tolerance  Patient limited by pain    Behavior During Therapy  [facility] for tasks assessed/performed       Past Medical History:  Diagnosis Date  . Anemia   . Environmental allergies   . History of IUFD     Past Surgical History:  Procedure Laterality Date  . NO PAST SURGERIES      There were no vitals filed for this visit.  Subjective Assessment - 09/06/19 1436    Subjective  Status about the same as last visit with right lumbar pain and left-sided neck pain. She reports is scheduled to get X-rays for ribs 09/10/19 and also sees MD tomorrow regarding both hand parasthesias and rib pain.    Currently in Pain?  Yes    Pain Score  8     Pain Location  Back    Pain Orientation  Lower    Pain Descriptors / Indicators  Aching;Tingling;Tightness    Pain Type  Acute pain    Pain Onset  1 to 4 weeks ago    Pain Frequency  Constant    Aggravating Factors   bending forward or backward    Pain Relieving Factors  medication, rest    Effect of Pain on Daily Activities  difficulty caring for child and working    Pain Score  8    Pain Location  Neck    Pain Descriptors / Indicators  Spasm    Pain Type  Acute pain    Pain Onset  1 to 4 weeks ago    Pain Frequency  Constant    Aggravating Factors   turning head to left    Pain Relieving Factors  lying down                        [facility] Adult PT Treatment/Exercise - 09/06/19 0001      Exercises   Exercises  Neck      Neck Exercises: Supine   Neck Retraction  15 reps    Neck Retraction Limitations  gentle retraction with therapist assistance      Lumbar Exercises: Stretches   Single Knee to Chest Stretch  Right;Left;3 reps;20 seconds    Single Knee to Chest Stretch Limitations  performed passively with therapist assistance    Lower Trunk Rotation Limitations  x 10 reps ea. way bilat., limited hold times due to soreness      Lumbar Exercises: Aerobic   Nustep  L4 UE/LE x 6 minutes      Lumbar Exercises: Supine   Ab Set  10 reps    AB Set Limitations  cues "belly" breathing      Moist Heat Therapy   Number Minutes Moist Heat  15 Minutes  Moist Heat Location  Cervical;Lumbar Spine   in supine with legs propped     Manual Therapy   Manual Therapy  Joint mobilization;Myofascial release;Manual Traction    Joint Mobilization  LAD to bilateral hips grade I-III oscillations    Myofascial Release  suboccipital release    Manual Traction  gentle cervical manual traction      Neck Exercises: Stretches   Upper Trapezius Stretch  Left;3 reps;20 seconds    Upper Trapezius Stretch Limitations  gentle supine manual stretch               PT Short Term Goals - 08/29/19 1014      PT SHORT TERM GOAL #1   Title  Pt will be I with HEP for pain relief, gentle ROM and stretching neck and low back/trunk.    Baseline  given on eval    Time  4    Period  Weeks    Status  New    Target Date  09/25/19      PT SHORT TERM GOAL #2   Title  Pt will show improved cervical flexion and extension and rotation to allow reading, scanning environment with 25% less pain    Baseline  limited flexion to 50 deg, ext to 20 deg and rotation limited about 25 % all with pain    Time  4    Period  Weeks    Status  New    Target Date  09/25/19      PT SHORT TERM GOAL #3   Title  Pt will be  able to sit and stand for 20-30 min at a time with pain <5/10    Baseline  10 min, pain >7/10    Time  4    Period  Weeks    Status  New    Target Date  09/25/19      PT SHORT TERM GOAL #4   Title  Pt will properly get up and down from sit to supine in order to reduce pain and strain on spine.    Baseline  needs cues, very difficult for patient, pain severe    Time  4    Period  Weeks    Status  New    Target Date  09/25/19        PT Long Term Goals - 08/29/19 1019      PT LONG TERM GOAL #1   Title  Pt will be able to sit comfortably for meals, playing with daughter with only min increase in neck and back pain    Baseline  unable > 5-10 min    Time  8    Period  Weeks    Status  New    Target Date  10/22/19      PT LONG TERM GOAL #2   Title  Pt will be able to lift, carry daughter as needed without increase in back or neck pain    Baseline  relies on mother to do this    Time  8    Period  Weeks    Status  New    Target Date  10/22/19      PT LONG TERM GOAL #3   Title  Pt will be I with HEP upon discharge from PT in order to maintain therapy benefit    Baseline  unknown    Time  8    Period  Weeks    Status  New    Target  Date  10/22/19      PT LONG TERM GOAL #4   Title  Pt will be able to show proper lifting techniques and body mechanics in order to perform job duties    Baseline  unknow, unable to work at present time    Time  8    Period  Weeks    Status  New    Target Date  10/22/19            Plan - 09/06/19 1530    Clinical Impression Statement  Continued high pain level/diffuse pain symptoms with limited activity tolerance. Held lumbar STM due to report of difficulty tolerated last attempt but added gentle long axis hip distraction mobilization for LBP with pt. able to tolerated as noted perf flowsheet. Also added gentle manual to cervical region-some tendency muscle guarding but able to tolerate gentle manual traction and suboccipital release as  well. Plan continue POC with pending further imaging next week.    Personal Factors and Comorbidities  Social Background    Examination-Activity Limitations  Bathing;Squat;Stairs;Lift;Bed Mobility;Bend;Locomotion Level;Stand;Carry;Sit;Sleep;Dressing    Examination-Participation Restrictions  Laundry;Interpersonal Relationship;Community Activity    Stability/Clinical Decision Making  Stable/Uncomplicated    Clinical Decision Making  Low    Rehab Potential  Excellent    PT Frequency  2x / week    PT Duration  8 weeks    PT Treatment/Interventions  ADLs/Self Care Home Management;Electrical Stimulation;Therapeutic activities;Patient/family education;Taping;Therapeutic exercise;Balance training;Passive range of motion;Neuromuscular re-education;Moist Heat;Cryotherapy;Functional mobility training;Manual techniques;Dry needling;Ultrasound    PT Next Visit Plan  continue and progress gentle exercise as able for lumbar and cervical region, gentle manual as tolerated, MHP/modalities prn for pain    PT Home Exercise Plan  [email or web address]    Consulted and Agree with Plan of Care  Patient       Patient will benefit from skilled therapeutic intervention in order to improve the following deficits and impairments:  Increased fascial restricitons, Pain, Increased muscle spasms, Decreased mobility, Decreased activity tolerance, Decreased range of motion, Decreased strength, Hypomobility, Impaired UE functional use, Impaired flexibility, Increased edema, Decreased balance, Difficulty walking  Visit Diagnosis: Cervicalgia  Cramp and spasm  Muscle weakness (generalized)  Acute bilateral low back pain, unspecified whether sciatica present     Problem List Patient Active Problem List   Diagnosis Date Noted  . Abnormal menses 12/07/2017  . 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 History of IUFD 05/22/2016    Beaulah Dinning, PT, DPT 09/06/19 3:34 PM  [facility] [address] [place], [place], [address] Phone: [phone]   Fax:  [phone]  Name: Emily Cain MRN: [id number] Date of Birth: 06-Jan-1996

## 2019-09-06 NOTE — Therapy (Signed)
[place], [place], [address] Phone: [phone]   Fax:  [phone]  Physical Therapy Treatment  Patient Details  Name: Emily Cain MRN: [id number] Date of Birth: [birth year] Referring Provider (PT): Janese Banks, NP   Encounter Date: 09/06/2019  PT End of Session - 09/06/19 1528    Visit Number  3    Number of Visits  16    Date for PT Re-Evaluation  10/23/19    Authorization Type  MCD    Authorization Time Period  09/03/19-09/16/19    Authorization - Visit Number  2    Authorization - Number of Visits  3    PT Start Time  2778    PT Stop Time  1540   49 min direct tx. time   PT Time Calculation (min)  68 min    Activity Tolerance  Patient limited by pain    Behavior During Therapy  [facility] for tasks assessed/performed       Past Medical History:  Diagnosis Date  . Anemia   . Environmental allergies   . History of IUFD     Past Surgical History:  Procedure Laterality Date  . NO PAST SURGERIES      There were no vitals filed for this visit.  Subjective Assessment - 09/06/19 1436    Subjective  Status about the same as last visit with right lumbar pain and left-sided neck pain. She reports is scheduled to get X-rays for ribs 09/10/19 and also sees MD tomorrow regarding both hand parasthesias and rib pain.    Currently in Pain?  Yes    Pain Score  8     Pain Location  Back    Pain Orientation  Lower    Pain Descriptors / Indicators  Aching;Tingling;Tightness    Pain Type  Acute pain    Pain Onset  1 to 4 weeks ago    Pain Frequency  Constant    Aggravating Factors   bending forward or backward    Pain Relieving Factors  medication, rest    Effect of Pain on Daily Activities  difficulty caring for child and working    Pain Score  8    Pain Location  Neck    Pain Descriptors / Indicators  Spasm    Pain Type  Acute pain    Pain Onset  1 to 4 weeks ago    Pain Frequency  Constant    Aggravating Factors   turning head to left    Pain Relieving Factors  lying down                       [facility] Adult PT Treatment/Exercise - 09/06/19 0001      Exercises   Exercises  Neck      Neck Exercises: Supine   Neck Retraction  15 reps    Neck Retraction Limitations  gentle retraction with therapist assistance      Lumbar Exercises: Stretches   Single Knee to Chest Stretch  Right;Left;3 reps;20 seconds    Single Knee to Chest Stretch Limitations  performed passively with therapist assistance    Lower Trunk Rotation Limitations  x 10 reps ea. way bilat., limited hold times due to soreness      Lumbar Exercises: Aerobic   Nustep  L4 UE/LE x 6 minutes      Lumbar Exercises: Supine   Ab Set  10 reps    AB Set Limitations  cues "belly" breathing      Moist Heat Therapy   Number Minutes Moist Heat  15 Minutes    Moist Heat Location  Cervical;Lumbar Spine   in supine with legs propped     Manual Therapy   Manual Therapy  Joint mobilization;Myofascial release;Manual Traction    Joint Mobilization  LAD to bilateral hips grade I-III oscillations    Myofascial Release  suboccipital release    Manual Traction  gentle cervical manual traction      Neck Exercises: Stretches   Upper Trapezius Stretch  Left;3 reps;20 seconds    Upper Trapezius Stretch Limitations  gentle supine manual stretch               PT Short Term Goals - 08/29/19 1014      PT SHORT TERM GOAL #1   Title  Pt will be I with HEP for pain relief, gentle ROM and stretching neck and low back/trunk.    Baseline  given on eval    Time  4    Period  Weeks    Status  New    Target Date  09/25/19      PT SHORT TERM GOAL #2   Title  Pt will show improved cervical flexion and extension and rotation to allow reading, scanning environment with 25% less pain    Baseline  limited flexion to 50 deg, ext to 20 deg and rotation limited about 25 % all with pain    Time  4    Period  Weeks     Status  New    Target Date  09/25/19      PT SHORT TERM GOAL #3   Title  Pt will be able to sit and stand for 20-30 min at a time with pain <5/10    Baseline  10 min, pain >7/10    Time  4    Period  Weeks    Status  New    Target Date  09/25/19      PT SHORT TERM GOAL #4   Title  Pt will properly get up and down from sit to supine in order to reduce pain and strain on spine.    Baseline  needs cues, very difficult for patient, pain severe    Time  4    Period  Weeks    Status  New    Target Date  09/25/19        PT Long Term Goals - 08/29/19 1019      PT LONG TERM GOAL #1   Title  Pt will be able to sit comfortably for meals, playing with daughter with only min increase in neck and back pain    Baseline  unable > 5-10 min    Time  8    Period  Weeks    Status  New    Target Date  10/22/19      PT LONG TERM GOAL #2   Title  Pt will be able to lift, carry daughter as needed without increase in back or neck pain    Baseline  relies on mother to do this    Time  8    Period  Weeks    Status  New    Target Date  10/22/19      PT LONG TERM GOAL #3   Title  Pt will be I with HEP upon discharge from PT in order to maintain therapy benefit    Baseline  unknown    Time  8    Period  Weeks    Status  New    Target Date  10/22/19      PT LONG TERM GOAL #4   Title  Pt will be able to show proper lifting techniques and body mechanics in order to perform job duties    Baseline  unknow, unable to work at present time    Time  8    Period  Weeks    Status  New    Target Date  10/22/19            Plan - 09/06/19 1530    Clinical Impression Statement  Continued high pain level/diffuse pain symptoms with limited activity tolerance. Held lumbar STM due to report of difficulty tolerated last attempt but added gentle long axis hip distraction mobilization for LBP with pt. able to tolerated as noted perf flowsheet. Also added gentle manual to cervical region-some tendency  muscle guarding but able to tolerate gentle manual traction and suboccipital release as well. Plan continue POC with pending further imaging next week.    Personal Factors and Comorbidities  Social Background    Examination-Activity Limitations  Bathing;Squat;Stairs;Lift;Bed Mobility;Bend;Locomotion Level;Stand;Carry;Sit;Sleep;Dressing    Examination-Participation Restrictions  Laundry;Interpersonal Relationship;Community Activity    Stability/Clinical Decision Making  Stable/Uncomplicated    Clinical Decision Making  Low    Rehab Potential  Excellent    PT Frequency  2x / week    PT Duration  8 weeks    PT Treatment/Interventions  ADLs/Self Care Home Management;Electrical Stimulation;Therapeutic activities;Patient/family education;Taping;Therapeutic exercise;Balance training;Passive range of motion;Neuromuscular re-education;Moist Heat;Cryotherapy;Functional mobility training;Manual techniques;Dry needling;Ultrasound    PT Next Visit Plan  continue and progress gentle exercise as able for lumbar and cervical region, gentle manual as tolerated, MHP/modalities prn for pain    PT Home Exercise Plan  [email or web address]    Consulted and Agree with Plan of Care  Patient       Patient will benefit from skilled therapeutic intervention in order to improve the following deficits and impairments:  Increased fascial restricitons, Pain, Increased muscle spasms, Decreased mobility, Decreased activity tolerance, Decreased range of motion, Decreased strength, Hypomobility, Impaired UE functional use, Impaired flexibility, Increased edema, Decreased balance, Difficulty walking  Visit Diagnosis: Cervicalgia  Cramp and spasm  Muscle weakness (generalized)  Acute bilateral low back pain, unspecified whether sciatica present     Problem List Patient Active Problem List   Diagnosis Date Noted  . Abnormal menses 12/07/2017  . 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 History of IUFD  05/22/2016    Lazarus Gowda, PT, DPT 09/06/19 3:37 PM  [facility] [address] [place], [place], [address] Phone: [phone]   Fax:  [phone]  Name: Emily Cain MRN: [id number] Date of Birth: 05-01-1996

## 2019-09-12 ENCOUNTER — Encounter: Payer: Self-pay | Admitting: Physical Therapy

## 2019-09-12 ENCOUNTER — Ambulatory Visit: Payer: Medicaid Other | Attending: Nurse Practitioner | Admitting: Physical Therapy

## 2019-09-12 ENCOUNTER — Other Ambulatory Visit: Payer: Self-pay

## 2019-09-12 DIAGNOSIS — M6281 Muscle weakness (generalized): Secondary | ICD-10-CM | POA: Insufficient documentation

## 2019-09-12 DIAGNOSIS — R252 Cramp and spasm: Secondary | ICD-10-CM | POA: Insufficient documentation

## 2019-09-12 DIAGNOSIS — M542 Cervicalgia: Secondary | ICD-10-CM | POA: Diagnosis not present

## 2019-09-12 DIAGNOSIS — M545 Low back pain, unspecified: Secondary | ICD-10-CM
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
## 2019-09-14 ENCOUNTER — Ambulatory Visit: Payer: Medicaid Other | Admitting: Physical Therapy

## 2019-09-26 ENCOUNTER — Other Ambulatory Visit: Payer: Self-pay

## 2019-09-26 ENCOUNTER — Encounter: Payer: Self-pay | Admitting: Physical Therapy

## 2019-09-26 ENCOUNTER — Ambulatory Visit: Payer: Medicaid Other | Admitting: Physical Therapy

## 2019-09-26 DIAGNOSIS — M542 Cervicalgia: Secondary | ICD-10-CM

## 2019-09-26 DIAGNOSIS — M6281 Muscle weakness (generalized): Secondary | ICD-10-CM

## 2019-09-26 DIAGNOSIS — M545 Low back pain, unspecified: Secondary | ICD-10-CM

## 2019-09-26 DIAGNOSIS — R252 Cramp and spasm: Secondary | ICD-10-CM

## 2019-09-26 NOTE — Therapy (Signed)
[place], [place], [address] Phone: [phone]   Fax:  [phone]  Physical Therapy Treatment  Patient Details  Name: Emily Cain MRN: [id number] Date of Birth: 1996/08/06 Referring Provider (PT): Janese Banks, NP   Encounter Date: 09/26/2019  PT End of Session - 09/26/19 1808    Visit Number  5    Number of Visits  16    Date for PT Re-Evaluation  10/23/19    Authorization Type  Medicaid    Authorization Time Period  09/18/19-10/22/19    Authorization - Visit Number  1    Authorization - Number of Visits  10    PT Start Time  3491    PT Stop Time  1812    PT Time Calculation (min)  55 min    Activity Tolerance  Patient limited by pain    Behavior During Therapy  [facility] for tasks assessed/performed       Past Medical History:  Diagnosis Date  . Anemia   . Environmental allergies   . History of IUFD     Past Surgical History:  Procedure Laterality Date  . NO PAST SURGERIES      There were no vitals filed for this visit.  Subjective Assessment - 09/26/19 2031    Subjective  Pt. saw MD after last therapy visit and is pending further imaging later this week including X-rays for ribs and also reports pending lumbar MRI. She had been noting improvement with cervical/left upper trapezius region pain after last session but reports exacerbated again. Worst region of pain is lumbar with radiating pain into bilat. thighs and left lower leg distal to around mid-calf. LBP worse in particular with sitting but also with prolonged standing.    Currently in Pain?  Yes    Pain Score  8     Pain Location  Back    Pain Orientation  Lower    Pain Descriptors / Indicators  Aching;Tightness;Tingling    Pain Type  Acute pain    Pain Radiating Towards  anterior thighs bilat. and left leg distal to mid-calf    Pain Onset  More than a month ago    Pain Frequency  Constant    Aggravating Factors   sitting,  prolonged standing    Pain Relieving Factors  medication and rest    Effect of Pain on Daily Activities  limits positional tolerance    Pain Score  7    Pain Location  Neck    Pain Orientation  Left    Pain Descriptors / Indicators  Spasm    Pain Type  Acute pain    Pain Radiating Towards  left upper trapezius    Pain Onset  More than a month ago    Pain Frequency  Constant    Aggravating Factors   left cervical rotation    Pain Relieving Factors  lying down                       [facility] Adult PT Treatment/Exercise - 09/26/19 0001      Lumbar Exercises: Stretches   Other Lumbar Stretch Exercise  manually-assisted right LTR from 90 deg hip flexion x 10 reps      Lumbar Exercises: Standing   Other Standing Lumbar Exercises  Trunk extension in standing 2x10 (done as alternative to trial prone extension due to neck pain exacerbation      Lumbar Exercises: Prone   Other Prone  Lumbar Exercises  Trial prone on elbows x 1 min (on forearms) then switches to passive prone extension on incline wedge due to muscular pain noted with holding POE position      Modalities   Modalities  Traction      Moist Heat Therapy   Number Minutes Moist Heat  10 Minutes    Moist Heat Location  Cervical;Lumbar Spine   lumbar and left upper trapezius region in sitting     Traction   Type of Traction  Lumbar    Min (lbs)  0    Max (lbs)  40    Hold Time  --   static setting   Time  10 minutes      Manual Therapy   Joint Mobilization  LAD to bilateral hips grade I-III oscillations             PT Education - 09/26/19 2039    Education Details  HEP-trial lumbar extension bias ROM to centralize leg pain, potential symptom etiology, centralization vs. peripheralization of symptoms, traction, HEP trial standing extension at home vs. passive prone extension over pillows given limited tolerance prone on elbows due to neck pain, self care instruction for sitting posture-avoidance rounded  lumbar spine and possible use lumbar roll    Person(s) Educated  Patient    Methods  Explanation;Demonstration;Verbal cues    Comprehension  Verbalized understanding;Returned demonstration       PT Short Term Goals - 09/12/19 1718      PT SHORT TERM GOAL #1   Title  Pt will be I with HEP for pain relief, gentle ROM and stretching neck and low back/trunk.    Baseline  given at eval-met for independent with HEP but limited tolerance exercise performance due to pain    Time  4    Period  Weeks    Status  Achieved    Target Date  09/25/19      PT SHORT TERM GOAL #2   Title  Pt will show improved cervical flexion and extension and rotation to allow reading, scanning environment with 25% less pain    Baseline  minimal change thus far, continues with high pain level    Time  4    Period  Weeks    Status  On-going    Target Date  09/25/19      PT SHORT TERM GOAL #3   Title  Pt will be able to sit and stand for 20-30 min at a time with pain <5/10    Baseline  10 min, pain >7/10    Time  4    Period  Weeks    Status  On-going    Target Date  09/25/19      PT SHORT TERM GOAL #4   Title  Pt will properly get up and down from sit to supine in order to reduce pain and strain on spine.    Baseline  still with difficulty due to high pain level and limited tolerance performance bed mobility and transfers    Time  4    Period  Weeks    Status  On-going    Target Date  09/25/19        PT Long Term Goals - 09/12/19 1723      PT LONG TERM GOAL #1   Title  Pt will be able to sit comfortably for meals, playing with daughter with only min increase in neck and back pain    Baseline  unable >  5-10 min    Time  8    Period  Weeks    Status  On-going      PT LONG TERM GOAL #2   Title  Pt will be able to lift, carry daughter as needed without increase in back or neck pain    Baseline  relies on mother to do this    Time  8    Period  Weeks    Status  On-going      PT LONG TERM GOAL #3    Title  Pt will be I with HEP upon discharge from PT in order to maintain therapy benefit    Baseline  unknown    Time  8    Period  Weeks    Status  On-going      PT LONG TERM GOAL #4   Title  Pt will be able to show proper lifting techniques and body mechanics in order to perform job duties    Baseline  unknown, unable to work at present time    Time  8    Period  Weeks    Status  On-going            Plan - 09/26/19 2041    Clinical Impression Statement  Focus lumbar region today as area of greatest symptoms-for left>right LE pain given factors including age of pt., mechanism of injury, and symptoms worse with sitting than standing suspect possible discogenic etiology so trial extension bias ROM. Limited tolerance prone ROM due to neck pain but able to tolerate standing-limited leg pain at time of performace but updated HEP with extension ROM and plan await further response with instructions to stop performing if consistently peripheralizing symptoms. Relief noted for lumbar pain with hip distraction so trial lumbar mechanical traction with good tolerance. Plan continue POC pending MRI results.    Examination-Activity Limitations  Bathing;Squat;Stairs;Lift;Bed Mobility;Bend;Locomotion Level;Stand;Carry;Sit;Sleep;Dressing    Examination-Participation Restrictions  Laundry;Interpersonal Relationship;Community Activity    Stability/Clinical Decision Making  Evolving/Moderate complexity    Clinical Decision Making  Moderate    Rehab Potential  Good   given continued high pain level/limited improvement to date   PT Frequency  2x / week    PT Duration  8 weeks    PT Treatment/Interventions  ADLs/Self Care Home Management;Electrical Stimulation;Therapeutic activities;Patient/family education;Taping;Therapeutic exercise;Balance training;Passive range of motion;Neuromuscular re-education;Moist Heat;Cryotherapy;Functional mobility training;Manual techniques;Dry needling;Ultrasound;Traction    addended POC sent to referring provider to include traction   PT Next Visit Plan  Check response extension bias trunk ROM and mechanical traction and continue as found beneficial, possible further [place] left upper trap region    PT Home Exercise Plan  [id number]    Consulted and Agree with Plan of Care  Patient       Patient will benefit from skilled therapeutic intervention in order to improve the following deficits and impairments:  Increased fascial restricitons, Pain, Increased muscle spasms, Decreased mobility, Decreased activity tolerance, Decreased range of motion, Decreased strength, Hypomobility, Impaired UE functional use, Impaired flexibility, Increased edema, Decreased balance, Difficulty walking  Visit Diagnosis: Cervicalgia  Cramp and spasm  Muscle weakness (generalized)  Acute bilateral low back pain, unspecified whether sciatica present     Problem List Patient Active Problem List   Diagnosis Date Noted  . Abnormal menses 12/07/2017  . 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 History of IUFD 05/22/2016    Beaulah Dinning, PT, DPT 09/26/19 8:48 PM  [facility] [address]  [place], [place], [address] Phone: [phone]   Fax:  [phone]  Name: Emily Cain MRN: [id number] Date of Birth: 12/20/1995

## 2019-09-27 ENCOUNTER — Ambulatory Visit: Payer: Medicaid Other | Admitting: Physical Therapy

## 2019-10-01 ENCOUNTER — Other Ambulatory Visit: Payer: Self-pay

## 2019-10-01 ENCOUNTER — Ambulatory Visit: Payer: Medicaid Other | Admitting: Physical Therapy

## 2019-10-01 ENCOUNTER — Encounter: Payer: Self-pay | Admitting: Physical Therapy

## 2019-10-01 DIAGNOSIS — M6281 Muscle weakness (generalized): Secondary | ICD-10-CM

## 2019-10-01 DIAGNOSIS — M542 Cervicalgia: Secondary | ICD-10-CM

## 2019-10-01 DIAGNOSIS — M545 Low back pain, unspecified: Secondary | ICD-10-CM

## 2019-10-01 DIAGNOSIS — R252 Cramp and spasm: Secondary | ICD-10-CM

## 2019-10-01 NOTE — Therapy (Signed)
[place], [place], [address] Phone: [phone]   Fax:  [phone]  Physical Therapy Treatment  Patient Details  Name: Emily Cain MRN: [id number] Date of Birth: 05/16/1996 Referring Provider (PT): Janese Banks, NP   Encounter Date: 10/01/2019  PT End of Session - 10/01/19 2006    Visit Number  6    Number of Visits  16    Date for PT Re-Evaluation  10/23/19    Authorization Type  Medicaid    Authorization Time Period  09/18/19-10/22/19    Authorization - Visit Number  2    Authorization - Number of Visits  10    PT Start Time  5537    PT Stop Time  1810    PT Time Calculation (min)  57 min    Activity Tolerance  Patient limited by pain    Behavior During Therapy  [facility] for tasks assessed/performed       Past Medical History:  Diagnosis Date  . Anemia   . Environmental allergies   . History of IUFD     Past Surgical History:  Procedure Laterality Date  . NO PAST SURGERIES      There were no vitals filed for this visit.  Subjective Assessment - 10/01/19 1803    Subjective  Pt. was unable to get lumbar MRI or rib X-rays last week due to transportation issues related to inclement weather. Imaging has been rescheduled for tomorrow. She reports continues with high pain level for both neck and back and reports some intermittent left UE radiating symptoms with weakness in hand. Left>right LE radiating pain has also persisted. She tried walking for some exercise but had difficulty tolerating and reports concern over ability to tolerate work duties (still has not returned to work, no firm date set for return). No significant relief with trial lumbar extension bias ROM or trial lumbar traction.    Diagnostic tests  cervical CT, lumbar X-ray    Patient Stated Goals  to get back to the way I was    Currently in Pain?  Yes    Pain Score  8     Pain Location  Back    Pain Orientation  Lower    Pain  Descriptors / Indicators  Aching;Tightness;Tingling    Pain Radiating Towards  bilat. anterior thighs and left leg distal to mid-calf    Pain Onset  More than a month ago    Pain Frequency  Constant    Aggravating Factors   sitting, prolonged standing    Pain Relieving Factors  medication and rest    Effect of Pain on Daily Activities  limits positional tolerance    Multiple Pain Sites  Yes    Pain Score  8    Pain Location  Neck    Pain Orientation  Left    Pain Descriptors / Indicators  Spasm    Pain Type  Acute pain    Pain Radiating Towards  left upper trapezois, intermittent left UE radiating symptoms with grip weakness         [facility] PT Assessment - 10/01/19 0001      AROM   Cervical - Right Rotation  45    Cervical - Left Rotation  40                   [facility] Adult PT Treatment/Exercise - 10/01/19 0001      Neck Exercises: Machines for Strengthening   Nustep  L2 x 7 min UE/LE      Neck Exercises: Seated   Other Seated Exercise  scapular retraction x 15 reps, shoulder rolls in standing x 15 reps      Neck Exercises: Supine   Neck Retraction  15 reps    Neck Retraction Limitations  with gentle therapist assistance      Moist Heat Therapy   Number Minutes Moist Heat  10 Minutes    Moist Heat Location  Cervical;Lumbar Spine   lumbar and left upper trapezius region in sitting     Ultrasound   Ultrasound Location  left upper trapezius and cervical paraspinals    Ultrasound Parameters  1 MHZ 50% 1.0 W/cm2    Ultrasound Goals  Pain      Manual Therapy   Manual Therapy  Soft tissue mobilization    Soft tissue mobilization  gentle STM in sitting left upper trapezius region    Myofascial Release  gentle suboccipital release    Manual Traction  cervical manual traction      Neck Exercises: Stretches   Levator Stretch  Left;3 reps;20 seconds   supine manual stretch   Other Neck Stretches  supine manual left upper trapezius stretch 20 sec x 3              PT Education - 10/01/19 2005    Education Details  POC    Person(s) Educated  Patient    Methods  Explanation    Comprehension  Verbalized understanding       PT Short Term Goals - 09/12/19 1718      PT SHORT TERM GOAL #1   Title  Pt will be I with HEP for pain relief, gentle ROM and stretching neck and low back/trunk.    Baseline  given at eval-met for independent with HEP but limited tolerance exercise performance due to pain    Time  4    Period  Weeks    Status  Achieved    Target Date  09/25/19      PT SHORT TERM GOAL #2   Title  Pt will show improved cervical flexion and extension and rotation to allow reading, scanning environment with 25% less pain    Baseline  minimal change thus far, continues with high pain level    Time  4    Period  Weeks    Status  On-going    Target Date  09/25/19      PT SHORT TERM GOAL #3   Title  Pt will be able to sit and stand for 20-30 min at a time with pain <5/10    Baseline  10 min, pain >7/10    Time  4    Period  Weeks    Status  On-going    Target Date  09/25/19      PT SHORT TERM GOAL #4   Title  Pt will properly get up and down from sit to supine in order to reduce pain and strain on spine.    Baseline  still with difficulty due to high pain level and limited tolerance performance bed mobility and transfers    Time  4    Period  Weeks    Status  On-going    Target Date  09/25/19        PT Long Term Goals - 09/12/19 1723      PT LONG TERM GOAL #1   Title  Pt will be able to sit comfortably for meals,  playing with daughter with only min increase in neck and back pain    Baseline  unable > 5-10 min    Time  8    Period  Weeks    Status  On-going      PT LONG TERM GOAL #2   Title  Pt will be able to lift, carry daughter as needed without increase in back or neck pain    Baseline  relies on mother to do this    Time  8    Period  Weeks    Status  On-going      PT LONG TERM GOAL #3   Title  Pt will  be I with HEP upon discharge from PT in order to maintain therapy benefit    Baseline  unknown    Time  8    Period  Weeks    Status  On-going      PT LONG TERM GOAL #4   Title  Pt will be able to show proper lifting techniques and body mechanics in order to perform job duties    Baseline  unknown, unable to work at present time    Time  [place] - 10/01/19 2007    Clinical Impression Statement  Mild improvement with cervical rotation AROM from last measures but overall limited progress with continued high pain level for both cervical and lumbar region. Suspect radiating symptoms into left hand could be radicular-will continue to monitor. Tx. focus cervical region today given more lumnar focus last session. For lumbar region will await results from MRI tomorrow with any potential changes in tx. POC or focus pending results.    Examination-Activity Limitations  Bathing;Squat;Stairs;Lift;Bed Mobility;Bend;Locomotion Level;Stand;Carry;Sit;Sleep;Dressing    Examination-Participation Restrictions  Laundry;Interpersonal Relationship;Community Activity    Stability/Clinical Decision Making  Evolving/Moderate complexity    Clinical Decision Making  Moderate    Rehab Potential  Fair   given continued high pain level and limited activity tolerance   PT Frequency  2x / week    PT Duration  8 weeks    PT Treatment/Interventions  ADLs/Self Care Home Management;Electrical Stimulation;Therapeutic activities;Patient/family education;Taping;Therapeutic exercise;Balance training;Passive range of motion;Neuromuscular re-education;Moist Heat;Cryotherapy;Functional mobility training;Manual techniques;Dry needling;Ultrasound;Traction    PT Next Visit Plan  await lumbar MRI results-pending results consideration further lumbar traction, continue LAD for hips, further trial extension bias ROM as tolerated, modalities prn    PT Home Exercise Plan  [id number]     Consulted and Agree with Plan of Care  Patient       Patient will benefit from skilled therapeutic intervention in order to improve the following deficits and impairments:  Increased fascial restricitons, Pain, Increased muscle spasms, Decreased mobility, Decreased activity tolerance, Decreased range of motion, Decreased strength, Hypomobility, Impaired UE functional use, Impaired flexibility, Increased edema, Decreased balance, Difficulty walking  Visit Diagnosis: Cervicalgia  Cramp and spasm  Muscle weakness (generalized)  Acute bilateral low back pain, unspecified whether sciatica present     Problem List Patient Active Problem List   Diagnosis Date Noted  . Abnormal menses 12/07/2017  . 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 History of IUFD 05/22/2016    Beaulah Dinning, PT, DPT 10/01/19 8:14 PM  [facility] [address] [place], [place], [address] Phone: [phone]   Fax:  [phone]  Name: Emily Cain MRN: [id number]  Date of Birth: 01/19/1996

## 2019-10-03 ENCOUNTER — Ambulatory Visit: Payer: Medicaid Other | Admitting: Physical Therapy

## 2019-10-03 ENCOUNTER — Encounter: Payer: Self-pay | Admitting: Physical Therapy

## 2019-10-03 ENCOUNTER — Other Ambulatory Visit: Payer: Self-pay

## 2019-10-03 DIAGNOSIS — R252 Cramp and spasm: Secondary | ICD-10-CM

## 2019-10-03 DIAGNOSIS — M542 Cervicalgia: Secondary | ICD-10-CM

## 2019-10-03 DIAGNOSIS — M545 Low back pain, unspecified: Secondary | ICD-10-CM

## 2019-10-03 DIAGNOSIS — M6281 Muscle weakness (generalized): Secondary | ICD-10-CM

## 2019-10-03 NOTE — Therapy (Signed)
[place], [place], [address] Phone: [phone]   Fax:  [phone]  Physical Therapy Treatment  Patient Details  Name: Emily Cain MRN: [id number] Date of Birth: 1996-03-15 Referring Provider (PT): Janese Banks, NP   Encounter Date: 10/03/2019  PT End of Session - 10/03/19 1334    Visit Number  7    Number of Visits  16    Date for PT Re-Evaluation  10/23/19    Authorization Type  Medicaid    Authorization Time Period  09/18/19-10/22/19    Authorization - Visit Number  3    Authorization - Number of Visits  10    PT Start Time  5790    PT Stop Time  1423    PT Time Calculation (min)  59 min    Activity Tolerance  Patient limited by pain    Behavior During Therapy  [facility] for tasks assessed/performed       Past Medical History:  Diagnosis Date  . Anemia   . Environmental allergies   . History of IUFD     Past Surgical History:  Procedure Laterality Date  . NO PAST SURGERIES      There were no vitals filed for this visit.  Subjective Assessment - 10/03/19 1327    Subjective  Pt. had rib X-rays and lumbar MRI yesterday-she reports X-ray came back (-). She has disc of MRI which unable to read at today's session and is still awaiting MRI report. Thigh discomfort noted last visit is improved but still having more distal left leg symptoms on left side. Still also with some intermittent grip issues on left side.                       [facility] Adult PT Treatment/Exercise - 10/03/19 0001      Lumbar Exercises: Stretches   Lower Trunk Rotation Limitations  manually-assisted right LTR x 10 reps    Other Lumbar Stretch Exercise  lumbar extension in standing with belt 2x10      Lumbar Exercises: Aerobic   Nustep  L3 x 7 min UE/LE      Lumbar Exercises: Supine   Clam Limitations  left hip "fall out" x10 reps      Manual Therapy   Manual Therapy  Neural Stretch    Joint Mobilization   LAD left hip grade I-III oscillations    Neural Stretch  manually assisted left nerve dural nerve stretch with hpi flexion x 10, knee flexion/extension x 10 and ankle DF/PF x 20       Trigger Point Dry Needling - 10/03/19 0001    Consent Given?  Yes    Education Handout Provided  Yes    Muscles Treated Back/Hip  Erector spinae;Lumbar multifidi    Dry Needling Comments  Needling in prone with 32 gauge 50 mm needles    Electrical Stimulation Performed with Dry Needling  Yes    E-stim with Dry Needling Details  TENS 20 pps x 10 minutes           PT Education - 10/03/19 1411    Education Details  Dry needling, POC    Person(s) Educated  Patient    Methods  Explanation;Handout    Comprehension  Verbalized understanding       PT Short Term Goals - 09/12/19 1718      PT SHORT TERM GOAL #1   Title  Pt will be I with HEP for pain  relief, gentle ROM and stretching neck and low back/trunk.    Baseline  given at eval-met for independent with HEP but limited tolerance exercise performance due to pain    Time  4    Period  Weeks    Status  Achieved    Target Date  09/25/19      PT SHORT TERM GOAL #2   Title  Pt will show improved cervical flexion and extension and rotation to allow reading, scanning environment with 25% less pain    Baseline  minimal change thus far, continues with high pain level    Time  4    Period  Weeks    Status  On-going    Target Date  09/25/19      PT SHORT TERM GOAL #3   Title  Pt will be able to sit and stand for 20-30 min at a time with pain <5/10    Baseline  10 min, pain >7/10    Time  4    Period  Weeks    Status  On-going    Target Date  09/25/19      PT SHORT TERM GOAL #4   Title  Pt will properly get up and down from sit to supine in order to reduce pain and strain on spine.    Baseline  still with difficulty due to high pain level and limited tolerance performance bed mobility and transfers    Time  4    Period  Weeks    Status  On-going     Target Date  09/25/19        PT Long Term Goals - 09/12/19 1723      PT LONG TERM GOAL #1   Title  Pt will be able to sit comfortably for meals, playing with daughter with only min increase in neck and back pain    Baseline  unable > 5-10 min    Time  8    Period  Weeks    Status  On-going      PT LONG TERM GOAL #2   Title  Pt will be able to lift, carry daughter as needed without increase in back or neck pain    Baseline  relies on mother to do this    Time  8    Period  Weeks    Status  On-going      PT LONG TERM GOAL #3   Title  Pt will be I with HEP upon discharge from PT in order to maintain therapy benefit    Baseline  unknown    Time  8    Period  Weeks    Status  On-going      PT LONG TERM GOAL #4   Title  Pt will be able to show proper lifting techniques and body mechanics in order to perform job duties    Baseline  unknown, unable to work at present time    Time  8    Period  Weeks    Status  On-going            Plan - 10/03/19 1439    Clinical Impression Statement  Tx. focus lumbar region with continued trial extension bias ROM-minimal leg symptoms at time of tx. so difficult to assess centralization response. Continued manual with hip distraction and tried adding gentle nerve glides as well as trial dry needling today to help address local lumbar myofascial pain and spasm. Will continue to await MRI  results. Fair progress as previously with continued high pain level.    Personal Factors and Comorbidities  Social Background    Examination-Activity Limitations  Bathing;Squat;Stairs;Lift;Bed Mobility;Bend;Locomotion Level;Stand;Carry;Sit;Sleep;Dressing    Examination-Participation Restrictions  Laundry;Interpersonal Relationship;Community Activity    Stability/Clinical Decision Making  Evolving/Moderate complexity    Clinical Decision Making  Moderate    Rehab Potential  Fair    PT Frequency  2x / week    PT Duration  8 weeks    PT  Treatment/Interventions  ADLs/Self Care Home Management;Electrical Stimulation;Therapeutic activities;Patient/family education;Taping;Therapeutic exercise;Balance training;Passive range of motion;Neuromuscular re-education;Moist Heat;Cryotherapy;Functional mobility training;Manual techniques;Dry needling;Ultrasound;Traction    PT Next Visit Plan  cawait MRI results for lumbar region, did dry needling help?, continue exercises, manual and modalities as tolerated, plan focus cervical region next session    PT Home Exercise Plan  [id number]    Consulted and Agree with Plan of Care  Patient       Patient will benefit from skilled therapeutic intervention in order to improve the following deficits and impairments:  Increased fascial restricitons, Pain, Increased muscle spasms, Decreased mobility, Decreased activity tolerance, Decreased range of motion, Decreased strength, Hypomobility, Impaired UE functional use, Impaired flexibility, Increased edema, Decreased balance, Difficulty walking  Visit Diagnosis: Cervicalgia  Cramp and spasm  Muscle weakness (generalized)  Acute bilateral low back pain, unspecified whether sciatica present     Problem List Patient Active Problem List   Diagnosis Date Noted  . Abnormal menses 12/07/2017  . 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

## 2019-10-03 NOTE — Patient Instructions (Signed)

## 2019-10-08 ENCOUNTER — Ambulatory Visit: Payer: BLUE CROSS/BLUE SHIELD | Admitting: Physical Therapy

## 2019-10-10 ENCOUNTER — Encounter: Payer: Self-pay | Admitting: Physical Therapy

## 2019-10-10 ENCOUNTER — Ambulatory Visit: Payer: BLUE CROSS/BLUE SHIELD | Attending: Nurse Practitioner | Admitting: Physical Therapy

## 2019-10-10 ENCOUNTER — Other Ambulatory Visit: Payer: Self-pay

## 2019-10-10 DIAGNOSIS — M545 Low back pain, unspecified: Secondary | ICD-10-CM

## 2019-10-10 DIAGNOSIS — M542 Cervicalgia: Secondary | ICD-10-CM | POA: Insufficient documentation

## 2019-10-10 DIAGNOSIS — R252 Cramp and spasm: Secondary | ICD-10-CM | POA: Diagnosis present

## 2019-10-10 DIAGNOSIS — M6281 Muscle weakness (generalized): Secondary | ICD-10-CM | POA: Diagnosis present

## 2019-10-10 NOTE — Therapy (Signed)
[place], [place], [address] Phone: [phone]   Fax:  [phone]  Physical Therapy Treatment  Patient Details  Name: Emily Cain MRN: [id number] Date of Birth: 1995-08-15 Referring Provider (PT): Janese Banks, NP   Encounter Date: 10/10/2019  PT End of Session - 10/10/19 1200    Visit Number  8    Number of Visits  16    Date for PT Re-Evaluation  10/23/19    Authorization Type  Medicaid    Authorization Time Period  09/18/19-10/22/19    Authorization - Visit Number  4    Authorization - Number of Visits  10    PT Start Time  1130    PT Stop Time  1240    PT Time Calculation (min)  70 min    Activity Tolerance  Patient limited by pain    Behavior During Therapy  [facility] for tasks assessed/performed       Past Medical History:  Diagnosis Date  . Anemia   . Environmental allergies   . History of IUFD     Past Surgical History:  Procedure Laterality Date  . NO PAST SURGERIES      There were no vitals filed for this visit.  Subjective Assessment - 10/10/19 1134    Subjective  Pt. received MRI results for lumbar spine which were (-). She reports difficult to say if dry needling trial last session helped but wasn't sore though she does report had some nausea-she is willing to try again but in discussing status for today has not eaten anything yet this AM so given nausea noted last session plan defer to next appointment. She reports continued pain similar to previous status 8/10 for both lumbar and cervical regions and requests lumbar focus today.    Currently in Pain?  Yes    Pain Score  8     Pain Location  Back    Pain Orientation  Lower    Pain Descriptors / Indicators  Tightness;Aching;Sharp    Pain Type  Acute pain    Pain Radiating Towards  bilat. thighs and left lower leg    Pain Onset  More than a month ago    Pain Frequency  Constant    Aggravating Factors   sitting, prolonged  standing    Pain Relieving Factors  medication and rest    Effect of Pain on Daily Activities  limits positional tolerance    Pain Score  8    Pain Location  Neck    Pain Orientation  Left    Pain Descriptors / Indicators  Tightness;Pressure    Pain Type  Acute pain    Pain Radiating Towards  left upper trapezius and notes some issues intermittently with left grip    Pain Onset  More than a month ago    Pain Frequency  Constant    Aggravating Factors   left cervical rotation    Pain Relieving Factors  lying down                       [facility] Adult PT Treatment/Exercise - 10/10/19 0001      Lumbar Exercises: Stretches   Passive Hamstring Stretch  Right;Left;3 reps    Passive Hamstring Stretch Limitations  10-20 sec holds ea. from 90/90 position    Single Knee to Chest Stretch  Right;Left;3 reps;10 seconds    Lower Trunk Rotation  5 reps      Lumbar  Exercises: Aerobic   Nustep  L3 x 6 min UE/LE      Lumbar Exercises: Supine   Pelvic Tilt  15 reps    Glut Set Limitations  glut set to partial/"mini" bridge x 10 reps    Clam  15 reps    Clam Limitations  red Theraband    Other Supine Lumbar Exercises  hip adduction isometric with ball 3 sec x 10 reps      Moist Heat Therapy   Number Minutes Moist Heat  10 Minutes    Moist Heat Location  Cervical;Lumbar Spine   in sitting      Traction   Type of Traction  Lumbar    Min (lbs)  30    Max (lbs)  60    Hold Time  60    Rest Time  20    Time  12      Manual Therapy   Joint Mobilization  LAD bilat. hips grade I-III oscillations    Soft tissue mobilization  STM in prone bilateral lumbar paraspinals    Neural Stretch  left median nerve floss x 10 reps, left supine dural nerve floss x 10 reps             PT Education - 10/10/19 1217    Education Details  MRI findings, exercises, potential lumbar symptom etiology associated with soft tissue injury/muscle spasm vs. associated inflammation    Person(s)  Educated  Patient    Methods  Explanation;Verbal cues    Comprehension  Verbalized understanding;Returned demonstration       PT Short Term Goals - 09/12/19 1718      PT SHORT TERM GOAL #1   Title  Pt will be I with HEP for pain relief, gentle ROM and stretching neck and low back/trunk.    Baseline  given at eval-met for independent with HEP but limited tolerance exercise performance due to pain    Time  4    Period  Weeks    Status  Achieved    Target Date  09/25/19      PT SHORT TERM GOAL #2   Title  Pt will show improved cervical flexion and extension and rotation to allow reading, scanning environment with 25% less pain    Baseline  minimal change thus far, continues with high pain level    Time  4    Period  Weeks    Status  On-going    Target Date  09/25/19      PT SHORT TERM GOAL #3   Title  Pt will be able to sit and stand for 20-30 min at a time with pain <5/10    Baseline  10 min, pain >7/10    Time  4    Period  Weeks    Status  On-going    Target Date  09/25/19      PT SHORT TERM GOAL #4   Title  Pt will properly get up and down from sit to supine in order to reduce pain and strain on spine.    Baseline  still with difficulty due to high pain level and limited tolerance performance bed mobility and transfers    Time  4    Period  Weeks    Status  On-going    Target Date  09/25/19        PT Long Term Goals - 09/12/19 1723      PT LONG TERM GOAL #1   Title  Pt  will be able to sit comfortably for meals, playing with daughter with only min increase in neck and back pain    Baseline  unable > 5-10 min    Time  8    Period  Weeks    Status  On-going      PT LONG TERM GOAL #2   Title  Pt will be able to lift, carry daughter as needed without increase in back or neck pain    Baseline  relies on mother to do this    Time  8    Period  Weeks    Status  On-going      PT LONG TERM GOAL #3   Title  Pt will be I with HEP upon discharge from PT in order to  maintain therapy benefit    Baseline  unknown    Time  8    Period  Weeks    Status  On-going      PT LONG TERM GOAL #4   Title  Pt will be able to show proper lifting techniques and body mechanics in order to perform job duties    Baseline  unknown, unable to work at present time    Time  8    Period  Weeks    Status  On-going            Plan - 10/10/19 1218    Clinical Impression Statement  Lumbar tx. focus today and as noted in subjective deferred dry needling today due to patient had not eaten anything today with some nausea symptoms last session (with needling/since resolved). Tried adding traction again along with continued exercises and manual therapy. Still with limited progress or change in status with therapy with continued high pain level.    Examination-Activity Limitations  Bathing;Squat;Stairs;Lift;Bed Mobility;Bend;Locomotion Level;Stand;Carry;Sit;Sleep;Dressing    Examination-Participation Restrictions  Laundry;Interpersonal Relationship;Community Activity    Stability/Clinical Decision Making  Evolving/Moderate complexity    Clinical Decision Making  Moderate    Rehab Potential  Fair    PT Frequency  2x / week    PT Duration  8 weeks    PT Treatment/Interventions  ADLs/Self Care Home Management;Electrical Stimulation;Therapeutic activities;Patient/family education;Taping;Therapeutic exercise;Balance training;Passive range of motion;Neuromuscular re-education;Moist Heat;Cryotherapy;Functional mobility training;Manual techniques;Dry needling;Ultrasound;Traction    PT Next Visit Plan  Potential dry needling, tentative cervical focus otherwise, NUSTEP vs. UBE warm up, Progress Theraband exercises for UE/periscapular region, STM and stretch left upper trap region, [place] prn    PT Home Exercise Plan  [id number]    Consulted and Agree with Plan of Care  Patient       Patient will benefit from skilled therapeutic intervention in order to improve the following deficits and  impairments:  Increased fascial restricitons, Pain, Increased muscle spasms, Decreased mobility, Decreased activity tolerance, Decreased range of motion, Decreased strength, Hypomobility, Impaired UE functional use, Impaired flexibility, Increased edema, Decreased balance, Difficulty walking  Visit Diagnosis: Cervicalgia  Cramp and spasm  Muscle weakness (generalized)  Acute bilateral low back pain, unspecified whether sciatica present     Problem List Patient Active Problem List   Diagnosis Date Noted  . Abnormal menses 12/07/2017  . 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 [place], [place], [address] Phone: [phone]   Fax:  [phone]  Name: Emily Cain MRN: [id number] Date of Birth: [birth year]

## 2019-10-12 ENCOUNTER — Encounter: Payer: Self-pay | Admitting: Physical Therapy

## 2019-10-12 ENCOUNTER — Ambulatory Visit: Payer: BLUE CROSS/BLUE SHIELD | Admitting: Physical Therapy

## 2019-10-12 ENCOUNTER — Other Ambulatory Visit: Payer: Self-pay

## 2019-10-12 DIAGNOSIS — M542 Cervicalgia: Secondary | ICD-10-CM

## 2019-10-12 DIAGNOSIS — M545 Low back pain, unspecified: Secondary | ICD-10-CM

## 2019-10-12 DIAGNOSIS — M6281 Muscle weakness (generalized): Secondary | ICD-10-CM

## 2019-10-12 DIAGNOSIS — R252 Cramp and spasm: Secondary | ICD-10-CM

## 2019-10-12 NOTE — Therapy (Signed)
[place], [place], [address] Phone: [phone]   Fax:  [phone]  Physical Therapy Treatment  Patient Details  Name: Emily Cain MRN: [id number] Date of Birth: 07-11-1996 Referring Provider (PT): Janese Banks, NP   Encounter Date: 10/12/2019  PT End of Session - 10/12/19 0826    Visit Number  9    Number of Visits  16    Date for PT Re-Evaluation  10/23/19    Authorization Type  Medicaid    Authorization Time Period  09/18/19-10/22/19    Authorization - Visit Number  5    Authorization - Number of Visits  10    PT Start Time  0758    PT Stop Time  0850    PT Time Calculation (min)  52 min    Activity Tolerance  Patient limited by pain    Behavior During Therapy  [facility] for tasks assessed/performed       Past Medical History:  Diagnosis Date  . Anemia   . Environmental allergies   . History of IUFD     Past Surgical History:  Procedure Laterality Date  . NO PAST SURGERIES      There were no vitals filed for this visit.  Subjective Assessment - 10/12/19 0823    Subjective  Pt. had follow up with spine specialist MD and cervical MRI has been ordered given left UE radicular symptoms and grip weakness. Status for symptoms the same/no improvement noted. She did not eat prior to session again so deferred dry needling given past nausea with needling on an empty stomach.         [facility] PT Assessment - 10/12/19 0001      Strength   Strength Assessment Site  --   R grip 60 lbs., L grip 9 lbs. wih grip dynamonometer                  [place] Adult PT Treatment/Exercise - 10/12/19 0001      Exercises   Exercises  --   "Digiflex" left grip x 10 reps     Neck Exercises: Machines for Strengthening   Nustep  L2 x 6 min UE/LE      Neck Exercises: Theraband   Shoulder Extension  15 reps    Shoulder Extension Limitations  yellow band    Rows  15 reps    Rows Limitations  yellow band     Shoulder External Rotation  15 reps    Shoulder External Rotation Limitations  supine with yellow band, bilat. UE at same time    Horizontal ABduction  15 reps    Horizontal ABduction Limitations  supine with yellow band, bilat. UE      Neck Exercises: Supine   Neck Retraction  15 reps    Neck Retraction Limitations  with therapist assist      Moist Heat Therapy   Number Minutes Moist Heat  10 Minutes    Moist Heat Location  Cervical;Lumbar Spine   in sitting      Manual Therapy   Soft tissue mobilization  STM left upper trapezius and levator region    Manual Traction  cervical manual traction and suboccipital release    Neural Stretch  left median and ulnar nerve floss x 15 reps ea.      Neck Exercises: Stretches   Upper Trapezius Stretch  Left;3 reps;20 seconds    Levator Stretch  Left;3 reps;20 seconds  PT Education - 10/12/19 0825    Education Details  POC    Person(s) Educated  Patient    Methods  Explanation    Comprehension  Verbalized understanding       PT Short Term Goals - 09/12/19 1718      PT SHORT TERM GOAL #1   Title  Pt will be I with HEP for pain relief, gentle ROM and stretching neck and low back/trunk.    Baseline  given at eval-met for independent with HEP but limited tolerance exercise performance due to pain    Time  4    Period  Weeks    Status  Achieved    Target Date  09/25/19      PT SHORT TERM GOAL #2   Title  Pt will show improved cervical flexion and extension and rotation to allow reading, scanning environment with 25% less pain    Baseline  minimal change thus far, continues with high pain level    Time  4    Period  Weeks    Status  On-going    Target Date  09/25/19      PT SHORT TERM GOAL #3   Title  Pt will be able to sit and stand for 20-30 min at a time with pain <5/10    Baseline  10 min, pain >7/10    Time  4    Period  Weeks    Status  On-going    Target Date  09/25/19      PT SHORT TERM GOAL #4    Title  Pt will properly get up and down from sit to supine in order to reduce pain and strain on spine.    Baseline  still with difficulty due to high pain level and limited tolerance performance bed mobility and transfers    Time  4    Period  Weeks    Status  On-going    Target Date  09/25/19        PT Long Term Goals - 09/12/19 1723      PT LONG TERM GOAL #1   Title  Pt will be able to sit comfortably for meals, playing with daughter with only min increase in neck and back pain    Baseline  unable > 5-10 min    Time  8    Period  Weeks    Status  On-going      PT LONG TERM GOAL #2   Title  Pt will be able to lift, carry daughter as needed without increase in back or neck pain    Baseline  relies on mother to do this    Time  8    Period  Weeks    Status  On-going      PT LONG TERM GOAL #3   Title  Pt will be I with HEP upon discharge from PT in order to maintain therapy benefit    Baseline  unknown    Time  8    Period  Weeks    Status  On-going      PT LONG TERM GOAL #4   Title  Pt will be able to show proper lifting techniques and body mechanics in order to perform job duties    Baseline  unknown, unable to work at present time    Time  8    Period  Weeks    Status  On-going  Plan - 10/12/19 0826    Clinical Impression Statement  Tx. focus for cervical region today. Still without signiifcant progress for both cervical and lumbar regions. Held dry needling today as noted in subjective. Plan continue PT for visits scheduled for next week but if no improvement by that time then would plan hold further therapy and recommend continue follow up with MD for further status and treatment options.    Examination-Activity Limitations  Bathing;Squat;Stairs;Lift;Bed Mobility;Bend;Locomotion Level;Stand;Carry;Sit;Sleep;Dressing    Examination-Participation Restrictions  Laundry;Interpersonal Relationship;Community Activity    Stability/Clinical Decision Making   Evolving/Moderate complexity    Clinical Decision Making  Moderate    Rehab Potential  Fair    PT Frequency  2x / week    PT Duration  8 weeks    PT Treatment/Interventions  ADLs/Self Care Home Management;Electrical Stimulation;Therapeutic activities;Patient/family education;Taping;Therapeutic exercise;Balance training;Passive range of motion;Neuromuscular re-education;Moist Heat;Cryotherapy;Functional mobility training;Manual techniques;Dry needling;Ultrasound;Traction    PT Next Visit Plan  Lumbar focus next session, Potential dry needling, tentative cervical focus otherwise, NUSTEP vs. UBE warm up, Progress Theraband exercises for UE/periscapular region, STM and stretch left upper trap region, [place] prn    PT Home Exercise Plan  [id number]    Consulted and Agree with Plan of Care  Patient       Patient will benefit from skilled therapeutic intervention in order to improve the following deficits and impairments:  Increased fascial restricitons, Pain, Increased muscle spasms, Decreased mobility, Decreased activity tolerance, Decreased range of motion, Decreased strength, Hypomobility, Impaired UE functional use, Impaired flexibility, Increased edema, Decreased balance, Difficulty walking  Visit Diagnosis: Cervicalgia  Cramp and spasm  Muscle weakness (generalized)  Acute bilateral low back pain, unspecified whether sciatica present     Problem List Patient Active Problem List   Diagnosis Date Noted  . Abnormal menses 12/07/2017  . 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 History of IUFD 05/22/2016    Beaulah Dinning, PT, DPT 10/12/19 8:42 AM  [facility] [address] [place], [place], [address] Phone: [phone]   Fax:  [phone]  Name: Emily Cain MRN: [id number] Date of Birth: 08-20-1995

## 2019-10-17 ENCOUNTER — Other Ambulatory Visit: Payer: Self-pay

## 2019-10-17 ENCOUNTER — Encounter: Payer: Self-pay | Admitting: Physical Therapy

## 2019-10-17 ENCOUNTER — Ambulatory Visit: Payer: BLUE CROSS/BLUE SHIELD | Admitting: Physical Therapy

## 2019-10-17 DIAGNOSIS — M542 Cervicalgia: Secondary | ICD-10-CM | POA: Diagnosis not present

## 2019-10-17 DIAGNOSIS — M545 Low back pain, unspecified: Secondary | ICD-10-CM

## 2019-10-17 DIAGNOSIS — M6281 Muscle weakness (generalized): Secondary | ICD-10-CM

## 2019-10-17 DIAGNOSIS — R252 Cramp and spasm: Secondary | ICD-10-CM

## 2019-10-17 NOTE — Therapy (Signed)
[name], [place], [address] Phone: [phone]   Fax:  [phone]  Physical Therapy Treatment  Patient Details  Name: Emily Cain MRN: [id number] Date of Birth: [birth year] Referring Provider (PT): Janese Banks, NP   Encounter Date: 10/17/2019  PT End of Session - 10/17/19 1049    Visit Number  10    Number of Visits  16    Date for PT Re-Evaluation  10/23/19    Authorization Type  Medicaid    Authorization Time Period  09/18/19-10/22/19    Authorization - Visit Number  6    Authorization - Number of Visits  10    PT Start Time  3818    PT Stop Time  1110   time dry needling, estim and MHP not in direct minutes   PT Time Calculation (min)  55 min    Activity Tolerance  Patient limited by pain    Behavior During Therapy  [facility] for tasks assessed/performed       Past Medical History:  Diagnosis Date  . Anemia   . Environmental allergies   . History of IUFD     Past Surgical History:  Procedure Laterality Date  . NO PAST SURGERIES      There were no vitals filed for this visit.  Subjective Assessment - 10/17/19 1014    Subjective  Still awaiting call to schedule cervical MRI. Symptoms about the same with continued high pain level for both neck and low back. Less thigh pain with walking but notes continues left UE symptoms including pain/pressure in elbow and continued left leg symptoms. Pt. ate prior to therapy session so agreeable to another trial dry needling.    Limitations  Sitting;Lifting;Standing;Walking;House hold activities;Other (comment)    Currently in Pain?  Yes    Pain Score  8     Pain Location  Back    Pain Orientation  Lower    Pain Descriptors / Indicators  Tightness;Sharp;Aching    Pain Type  Acute pain    Pain Radiating Towards  left LE    Pain Onset  More than a month ago    Pain Frequency  Constant    Aggravating Factors   sitting, prolonged standing, bending motions     Pain Relieving Factors  medication and rest    Effect of Pain on Daily Activities  limits positional tolerance, ability ADLs    Pain Score  8    Pain Location  Neck    Pain Orientation  Left    Pain Descriptors / Indicators  Tightness;Pressure    Pain Type  Acute pain    Pain Radiating Towards  left upper trapezius and notes intermittently with left grip, pain in left elbow    Pain Onset  More than a month ago    Pain Frequency  Constant    Aggravating Factors   left cervical rotation    Pain Relieving Factors  lying down    Effect of Pain on Daily Activities  see above                       [facility] Adult PT Treatment/Exercise - 10/17/19 0001      Lumbar Exercises: Stretches   Passive Hamstring Stretch  Right;Left;2 reps;30 seconds    Double Knee to Chest Stretch Limitations  legs on 55 cm P-ball    Lower Trunk Rotation Limitations  x10 ea. way bilat.    Sports administrator  Right;3 reps;30 seconds    Quad Stretch Limitations  supine manual stretch      Lumbar Exercises: Aerobic   Nustep  L3 x 5 min UE/LE      Lumbar Exercises: Supine   Pelvic Tilt  15 reps    Bridge Limitations  partial bridge with legs on reversed incline wedge      Moist Heat Therapy   Number Minutes Moist Heat  10 Minutes    Moist Heat Location  Cervical;Lumbar Spine   in prone     Manual Therapy   Soft tissue mobilization  IASTM/fowm roll left quad, STM bilat. lumbar paraspinals       Trigger Point Dry Needling - 10/17/19 0001    Consent Given?  Yes    Muscles Treated Head and Neck  Upper trapezius   left side with 32 gauge 30 mm needle   Muscles Treated Back/Hip  Erector spinae;Lumbar multifidi   L4-5 region bilat. with 32 gaue 50 mm needles   Dry Needling Comments  Needling in prone with pillow under shins    Electrical Stimulation Performed with Dry Needling  Yes    E-stim with Dry Needling Details  TENS 20 pps x 10 minutes   estim to lumbar region only          PT  Education - 10/17/19 1049    Education Details  Dry needling, POC    Person(s) Educated  Patient    Methods  Explanation    Comprehension  Verbalized understanding       PT Short Term Goals - 09/12/19 1718      PT SHORT TERM GOAL #1   Title  Pt will be I with HEP for pain relief, gentle ROM and stretching neck and low back/trunk.    Baseline  given at eval-met for independent with HEP but limited tolerance exercise performance due to pain    Time  4    Period  Weeks    Status  Achieved    Target Date  09/25/19      PT SHORT TERM GOAL #2   Title  Pt will show improved cervical flexion and extension and rotation to allow reading, scanning environment with 25% less pain    Baseline  minimal change thus far, continues with high pain level    Time  4    Period  Weeks    Status  On-going    Target Date  09/25/19      PT SHORT TERM GOAL #3   Title  Pt will be able to sit and stand for 20-30 min at a time with pain <5/10    Baseline  10 min, pain >7/10    Time  4    Period  Weeks    Status  On-going    Target Date  09/25/19      PT SHORT TERM GOAL #4   Title  Pt will properly get up and down from sit to supine in order to reduce pain and strain on spine.    Baseline  still with difficulty due to high pain level and limited tolerance performance bed mobility and transfers    Time  4    Period  Weeks    Status  On-going    Target Date  09/25/19        PT Long Term Goals - 09/12/19 1723      PT LONG TERM GOAL #1   Title  Pt will be able to sit  comfortably for meals, playing with daughter with only min increase in neck and back pain    Baseline  unable > 5-10 min    Time  8    Period  Weeks    Status  On-going      PT LONG TERM GOAL #2   Title  Pt will be able to lift, carry daughter as needed without increase in back or neck pain    Baseline  relies on mother to do this    Time  8    Period  Weeks    Status  On-going      PT LONG TERM GOAL #3   Title  Pt will be I  with HEP upon discharge from PT in order to maintain therapy benefit    Baseline  unknown    Time  8    Period  Weeks    Status  On-going      PT LONG TERM GOAL #4   Title  Pt will be able to show proper lifting techniques and body mechanics in order to perform job duties    Baseline  unknown, unable to work at present time    Time  8    Period  Weeks    Status  On-going            Plan - 10/17/19 1050    Clinical Impression Statement  Limited improvement as previously though mild benefit as noted for less thigh pain with walking. Second trial dry needling today for lumbar region again and added left upper trapezius-fair tolerance with some apprehension with needling but able to tolerate passive technique as noted per flowsheet.    Personal Factors and Comorbidities  Social Background    Examination-Activity Limitations  Bathing;Squat;Stairs;Lift;Bed Mobility;Bend;Locomotion Level;Stand;Carry;Sit;Sleep;Dressing    Examination-Participation Restrictions  Laundry;Interpersonal Relationship;Community Activity    Stability/Clinical Decision Making  Evolving/Moderate complexity    Clinical Decision Making  Moderate    Rehab Potential  Fair    PT Frequency  2x / week    PT Duration  8 weeks    PT Treatment/Interventions  ADLs/Self Care Home Management;Electrical Stimulation;Therapeutic activities;Patient/family education;Taping;Therapeutic exercise;Balance training;Passive range of motion;Neuromuscular re-education;Moist Heat;Cryotherapy;Functional mobility training;Manual techniques;Dry needling;Ultrasound;Traction    PT Next Visit Plan  Cervical focus next session, if no significant improvement consider hold further therapy pending further diagnostics with cervical MRI due to limited benefit noted with therapy thus far, NUSTEP vs. UBE warm up, Progress Theraband exercises for UE/periscapular region, STM and stretch left upper trap region, [place] prn    PT Home Exercise Plan  [id number]     Consulted and Agree with Plan of Care  Patient       Patient will benefit from skilled therapeutic intervention in order to improve the following deficits and impairments:  Increased fascial restricitons, Pain, Increased muscle spasms, Decreased mobility, Decreased activity tolerance, Decreased range of motion, Decreased strength, Hypomobility, Impaired UE functional use, Impaired flexibility, Increased edema, Decreased balance, Difficulty walking  Visit Diagnosis: Cervicalgia  Cramp and spasm  Muscle weakness (generalized)  Acute bilateral low back pain, unspecified whether sciatica present     Problem List Patient Active Problem List   Diagnosis Date Noted  . Abnormal menses 12/07/2017  . 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 [place], [place], [address] Phone: [phone]   Fax:  [phone]  Name: Emily Cain MRN: [id number] Date of Birth: 1996-03-17

## 2019-10-19 ENCOUNTER — Encounter: Payer: Self-pay | Admitting: Physical Therapy

## 2019-10-19 ENCOUNTER — Other Ambulatory Visit: Payer: Self-pay

## 2019-10-19 ENCOUNTER — Ambulatory Visit: Payer: BLUE CROSS/BLUE SHIELD | Admitting: Physical Therapy

## 2019-10-19 DIAGNOSIS — M6281 Muscle weakness (generalized): Secondary | ICD-10-CM

## 2019-10-19 DIAGNOSIS — M542 Cervicalgia: Secondary | ICD-10-CM | POA: Diagnosis not present

## 2019-10-19 DIAGNOSIS — M545 Low back pain, unspecified: Secondary | ICD-10-CM

## 2019-10-19 DIAGNOSIS — R252 Cramp and spasm: Secondary | ICD-10-CM

## 2019-10-19 NOTE — Therapy (Signed)
[place], [place], [address] Phone: [phone]   Fax:  [phone]  Physical Therapy Treatment/Recertification  Patient Details  Name: Emily Cain MRN: [id number] Date of Birth: 1996-03-30 Referring Provider (PT): Janese Banks, NP   Encounter Date: 10/19/2019  PT End of Session - 10/19/19 1357    Visit Number  11    Number of Visits  19    Date for PT Re-Evaluation  11/23/19    Authorization Type  Medicaid    Authorization Time Period  09/18/19-10/22/19    Authorization - Visit Number  7    Authorization - Number of Visits  10    PT Start Time  1016    PT Stop Time  1100    PT Time Calculation (min)  44 min    Activity Tolerance  Patient tolerated treatment well    Behavior During Therapy  [facility] for tasks assessed/performed       Past Medical History:  Diagnosis Date  . Anemia   . Environmental allergies   . History of IUFD     Past Surgical History:  Procedure Laterality Date  . NO PAST SURGERIES      There were no vitals filed for this visit.  Subjective Assessment - 10/19/19 1018    Subjective  Pt. was previously not noting any significant improvement from therapy but reports benefit from dry needling to lumbar region last session. LBP is primarily noted with trunk flexion movements with no LBP noted pre-tx./improving pain otherwise with gains for standing and walking tolerance. Still having some intermittent neck/left upper trapezius region pain and also continued difficulty with left grip-still pending cervical MRI to assess.    Pertinent History  none    Limitations  Sitting;Lifting;Standing;Walking;House hold activities;Other (comment)    How long can you sit comfortably?  1 hour and 30 minutes but notes difficulty sitting up straight    How long can you stand comfortably?  1 hour and 30 minutes    How long can you walk comfortably?  30 minutes    Diagnostic tests  cervical CT,  lumbar X-ray    Patient Stated Goals  to get back to the way I was         [facility] PT Assessment - 10/19/19 0001      AROM   Cervical Flexion  30    Cervical Extension  30    Cervical - Right Side Bend  29    Cervical - Left Side Bend  35    Cervical - Right Rotation  75    Cervical - Left Rotation  60    Lumbar Flexion  40    Lumbar Extension  25    Lumbar - Right Side Bend  29    Lumbar - Left Side Bend  39    Lumbar - Right Rotation  WFL    Lumbar - Left Rotation  [facility]      Strength   Strength Assessment Site  Shoulder;Elbow;Wrist;Hip;Knee;Ankle    Right/Left Shoulder  Right;Left    Right Shoulder Flexion  5/5    Right Shoulder ABduction  5/5    Right Shoulder Internal Rotation  5/5    Right Shoulder External Rotation  5/5    Left Shoulder Flexion  4/5    Left Shoulder ABduction  4/5    Left Shoulder Internal Rotation  4/5    Left Shoulder External Rotation  4/5    Right/Left Elbow  Right;Left  Right Elbow Flexion  5/5    Right Elbow Extension  5/5    Left Elbow Flexion  5/5    Left Elbow Extension  5/5    Right/Left Wrist  Right;Left   Left grip 13 lbs.,right grip 28 lbs. with grip dynamonometer   Right Wrist Flexion  5/5    Right Wrist Extension  5/5    Left Wrist Flexion  4/5    Left Wrist Extension  4/5    Right Hip Flexion  4/5    Left Hip Flexion  4/5    Right Knee Flexion  4/5    Right Knee Extension  4/5    Left Knee Flexion  4/5    Left Knee Extension  4/5    Right/Left Ankle  Right;Left    Right Ankle Dorsiflexion  4/5    Right Ankle Inversion  4/5    Right Ankle Eversion  4/5    Left Ankle Dorsiflexion  4/5    Left Ankle Inversion  4/5    Left Ankle Eversion  4/5                   [facility] Adult PT Treatment/Exercise - 10/19/19 0001      Neck Exercises: Theraband   Rows  15 reps;Red      Neck Exercises: Seated   Neck Retraction  15 reps      Lumbar Exercises: Stretches   Single Knee to Chest Stretch  Right;Left;3 reps;10  seconds    Lower Trunk Rotation  5 reps      Lumbar Exercises: Aerobic   Nustep  L3 x 6 min      Lumbar Exercises: Supine   Pelvic Tilt  15 reps    Bridge Limitations  partial bridge x 15 reps       Trigger Point Dry Needling - 10/19/19 0001    Consent Given?  Yes    Muscles Treated Head and Neck  Upper trapezius   left side only with 32 gauge 30 mm needles   Muscles Treated Back/Hip  Erector spinae;Lumbar multifidi   L4-5 region bilat. with 32 gaue 50 mm needles   Dry Needling Comments  Needling in prone with pillow under shins    Electrical Stimulation Performed with Dry Needling  Yes    E-stim with Dry Needling Details  TENS 20 pps x 10 minutes           PT Education - 10/19/19 1357    Education Details  POC, HEP updates    Person(s) Educated  Patient    Methods  Explanation;Demonstration;Verbal cues;Handout    Comprehension  Returned demonstration;Verbalized understanding       PT Short Term Goals - 10/19/19 1405      PT SHORT TERM GOAL #1   Title  Pt will be I with HEP for pain relief, gentle ROM and stretching neck and low back/trunk.    Baseline  met/updated today    Time  4    Period  Weeks    Status  Achieved      PT SHORT TERM GOAL #2   Title  Pt will show improved cervical flexion and extension and rotation to allow reading, scanning environment with 25% less pain    Baseline  pain level intermittently higher but met for improved ROM    Time  4    Period  Weeks    Status  Partially Met      PT SHORT TERM GOAL #3  Title  Pt will be able to sit and stand for 20-30 min at a time with pain <5/10    Baseline  now improving but pain intermittently higher    Time  4    Period  Weeks    Status  On-going      PT SHORT TERM GOAL #4   Title  Pt will properly get up and down from sit to supine in order to reduce pain and strain on spine.    Baseline  demos correct logrolling technique    Time  4    Period  Weeks    Status  Achieved        PT Long  Term Goals - 10/19/19 1407      PT LONG TERM GOAL #1   Title  Pt will be able to sit comfortably for meals, playing with daughter with only min increase in neck and back pain    Baseline  progressing but goal ongoing    Time  8    Period  Weeks    Status  On-going    Target Date  11/23/19      PT LONG TERM GOAL #2   Title  Pt will be able to lift, carry daughter as needed without increase in back or neck pain    Baseline  still with difficulty with left grip weakness    Time  8    Period  Weeks    Status  On-going    Target Date  11/23/19      PT LONG TERM GOAL #3   Title  Pt will be I with HEP upon discharge from PT in order to maintain therapy benefit    Baseline  HEP updated today-plan update as needed prior to d/c    Time  8    Period  Weeks    Status  On-going    Target Date  11/23/19      PT LONG TERM GOAL #4   Title  Pt will be able to show proper lifting techniques and body mechanics in order to perform job duties    Baseline  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 presents for 10th therapy session today-previously not noting any significant improvement but noting more benefit today from treatment earlier this week particularly for LBP with functional gains for positional tolerance, lumbar symptoms mainly with trunk flexion AROM. For neck she continues with left UE radiating symptoms with grip weakness pending cervical MRI to rule out radicular etiology. Given recent benefit noted from therapy plan continue for a few more weeks of visits for further progress.    Personal Factors and Comorbidities  Social Background    Examination-Activity Limitations  Bathing;Squat;Stairs;Lift;Bed Mobility;Bend;Locomotion Level;Stand;Carry;Sit;Sleep;Dressing    Examination-Participation  Restrictions  Laundry;Interpersonal Relationship;Community Activity    Stability/Clinical Decision Making  Evolving/Moderate complexity    Clinical Decision Making  Moderate    Rehab Potential  Fair    PT Frequency  2x / week    PT Duration  4 weeks    PT Treatment/Interventions  ADLs/Self Care Home Management;Electrical Stimulation;Therapeutic activities;Patient/family education;Taping;Therapeutic exercise;Balance training;Passive range of motion;Neuromuscular re-education;Moist Heat;Cryotherapy;Functional mobility training;Manual techniques;Dry needling;Ultrasound;Traction  PT Next Visit Plan  Continue neck and back tx. for ROM, stretches, as tolerated progress functional activities for strengthening UE, LE, core strengthening, further dry needling prn, potential traction for cerviacal region    PT Home Exercise Plan  [id number], posterior pelvic tilts, LTR, SKTC, hip bridge, child's pose stretch, cervical retractions, Theraband row    Consulted and Agree with Plan of Care  Patient       Patient will benefit from skilled therapeutic intervention in order to improve the following deficits and impairments:  Increased fascial restricitons, Pain, Increased muscle spasms, Decreased mobility, Decreased activity tolerance, Decreased range of motion, Decreased strength, Hypomobility, Impaired UE functional use, Impaired flexibility, Increased edema, Decreased balance, Difficulty walking  Visit Diagnosis: Cervicalgia  Cramp and spasm  Muscle weakness (generalized)  Acute bilateral low back pain, unspecified whether sciatica present     Problem List Patient Active Problem List   Diagnosis Date Noted  . Abnormal menses 12/07/2017  . 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 History of IUFD 05/22/2016    Beaulah Dinning, PT, DPT 10/19/19 2:12 PM  [facility] [address] [place], [place], [address] Phone:  [phone]   Fax:  [phone]  Name: Emily Cain MRN: [id number] Date of Birth: 03-27-1996

## 2019-11-07 ENCOUNTER — Encounter: Payer: Self-pay | Admitting: Physical Therapy

## 2019-11-07 ENCOUNTER — Ambulatory Visit: Payer: BLUE CROSS/BLUE SHIELD | Admitting: Physical Therapy

## 2019-11-07 ENCOUNTER — Other Ambulatory Visit: Payer: Self-pay

## 2019-11-07 DIAGNOSIS — M545 Low back pain, unspecified: Secondary | ICD-10-CM

## 2019-11-07 DIAGNOSIS — M6281 Muscle weakness (generalized): Secondary | ICD-10-CM

## 2019-11-07 DIAGNOSIS — M542 Cervicalgia: Secondary | ICD-10-CM | POA: Diagnosis not present

## 2019-11-07 DIAGNOSIS — R252 Cramp and spasm: Secondary | ICD-10-CM

## 2019-11-07 NOTE — Therapy (Signed)
[place], [place], [address] Phone: [phone]   Fax:  [phone]  Physical Therapy Treatment  Patient Details  Name: Emily Cain MRN: [id number] Date of Birth: 11-Aug-1995 Referring Provider (PT): Janese Banks, NP   Encounter Date: 11/07/2019  PT End of Session - 11/07/19 1118    Visit Number  12    Number of Visits  19    Date for PT Re-Evaluation  11/23/19    Authorization Type  Medicaid    PT Start Time  1013    PT Stop Time  1105    PT Time Calculation (min)  52 min    Activity Tolerance  Patient tolerated treatment well    Behavior During Therapy  [facility] for tasks assessed/performed       Past Medical History:  Diagnosis Date  . Anemia   . Environmental allergies   . History of IUFD     Past Surgical History:  Procedure Laterality Date  . NO PAST SURGERIES      There were no vitals filed for this visit.  Subjective Assessment - 11/07/19 1011    Subjective  Pt. returns, not seen since 3/12 while awaiting insurance approval and with some scheduling delays. She was scheduled for cervical MRI yesterday but was unable to have due to some mix up wth appointment/arrival time. Doing a little better since last visit but still with pain 6.5/10 in both neck and low back. She continues to note grip weakness on left side.    Limitations  Sitting;Lifting;Standing;Walking;House hold activities;Other (comment)    Currently in Pain?  Yes    Pain Score  --   "6 1/2"   Pain Location  Back    Pain Orientation  Lower    Pain Descriptors / Indicators  Tightness;Aching;Sharp    Pain Type  Acute pain    Pain Onset  More than a month ago    Pain Frequency  Constant    Aggravating Factors   bending motions and prolonged sitting and standing    Pain Relieving Factors  medication and rest    Effect of Pain on Daily Activities  Limits positional tolerance and ability ADLs    Pain Score  --   "6 1/2"   Pain Location  Neck    Pain Orientation  Left    Pain Descriptors / Indicators  Tightness;Pressure    Pain Type  Acute pain    Pain Onset  More than a month ago    Pain Frequency  Constant    Aggravating Factors   left cervical rotation    Pain Relieving Factors  lying down    Effect of Pain on Daily Activities  see above                       [facility] Adult PT Treatment/Exercise - 11/07/19 0001      Neck Exercises: Machines for Strengthening   Nustep  L5 x 6 min UE/LE      Neck Exercises: Theraband   Shoulder Extension  20 reps;Red    Rows  20 reps;Green      Neck Exercises: Supine   Neck Retraction  15 reps      Moist Heat Therapy   Number Minutes Moist Heat  10 Minutes    Moist Heat Location  Cervical      Ultrasound   Ultrasound Location  left upper trapezius region    Ultrasound Parameters  1  MHZ 50% 1.0 W/cm2 x 8 min   started with 100% but pt. reported felt hot   Ultrasound Goals  Pain      Manual Therapy   Soft tissue mobilization  STM in sitting left upper trapezius region    Manual Traction  cervical manual traction    Neural Stretch  left median and ulnar nerve floss x 10 ea.      Neck Exercises: Stretches   Upper Trapezius Stretch  Left;2 reps;30 seconds    Levator Stretch  Left;2 reps;30 seconds             PT Education - 11/07/19 1118    Education Details  POC    Person(s) Educated  Patient    Methods  Explanation    Comprehension  Verbalized understanding       PT Short Term Goals - 10/19/19 1405      PT SHORT TERM GOAL #1   Title  Pt will be I with HEP for pain relief, gentle ROM and stretching neck and low back/trunk.    Baseline  met/updated today    Time  4    Period  Weeks    Status  Achieved      PT SHORT TERM GOAL #2   Title  Pt will show improved cervical flexion and extension and rotation to allow reading, scanning environment with 25% less pain    Baseline  pain level intermittently higher but met for improved  ROM    Time  4    Period  Weeks    Status  Partially Met      PT SHORT TERM GOAL #3   Title  Pt will be able to sit and stand for 20-30 min at a time with pain <5/10    Baseline  now improving but pain intermittently higher    Time  4    Period  Weeks    Status  On-going      PT SHORT TERM GOAL #4   Title  Pt will properly get up and down from sit to supine in order to reduce pain and strain on spine.    Baseline  demos correct logrolling technique    Time  4    Period  Weeks    Status  Achieved        PT Long Term Goals - 10/19/19 1407      PT LONG TERM GOAL #1   Title  Pt will be able to sit comfortably for meals, playing with daughter with only min increase in neck and back pain    Baseline  progressing but goal ongoing    Time  8    Period  Weeks    Status  On-going    Target Date  11/23/19      PT LONG TERM GOAL #2   Title  Pt will be able to lift, carry daughter as needed without increase in back or neck pain    Baseline  still with difficulty with left grip weakness    Time  8    Period  Weeks    Status  On-going    Target Date  11/23/19      PT LONG TERM GOAL #3   Title  Pt will be I with HEP upon discharge from PT in order to maintain therapy benefit    Baseline  HEP updated today-plan update as needed prior to d/c    Time  8    Period  Weeks    Status  On-going    Target Date  11/23/19      PT LONG TERM GOAL #4   Title  Pt will be able to show proper lifting techniques and body mechanics in order to perform job duties    Baseline  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 Held dry needling due to pt. did not  eat prior to session but otherwise tx. as noted well-tolerated.    Examination-Activity Limitations  Bathing;Squat;Stairs;Lift;Bed Mobility;Bend;Locomotion Level;Stand;Carry;Sit;Sleep;Dressing    Examination-Participation Restrictions  Laundry;Interpersonal Relationship;Community Activity    Stability/Clinical Decision Making  Evolving/Moderate complexity    Clinical Decision Making  Moderate    Rehab Potential  Fair    PT Frequency  2x / week    PT Duration  4 weeks    PT Treatment/Interventions  ADLs/Self Care Home Management;Electrical Stimulation;Therapeutic activities;Patient/family education;Taping;Therapeutic exercise;Balance training;Passive range of motion;Neuromuscular re-education;Moist Heat;Cryotherapy;Functional mobility training;Manual techniques;Dry needling;Ultrasound;Traction    PT Next Visit Plan  Focus back next session: ROM, stretches, as tolerated progress functional activities for strengthening UE, LE, core strengthening, further dry needling prn, potential traction for cerviacal region    PT Home Exercise Plan  [id number], posterior pelvic tilts, LTR, SKTC, hip bridge, child's pose stretch, cervical retractions, Theraband row    Consulted and Agree with Plan of Care  Patient       Patient will benefit from skilled therapeutic intervention in order to improve the following deficits and impairments:  Increased fascial restricitons, Pain, Increased muscle spasms, Decreased mobility, Decreased activity tolerance, Decreased range of motion, Decreased strength, Hypomobility, Impaired UE functional use, Impaired flexibility, Increased edema, Decreased balance, Difficulty walking  Visit Diagnosis: Cervicalgia  Cramp and spasm  Muscle weakness (generalized)  Acute bilateral low back pain, unspecified whether sciatica present     Problem List Patient Active Problem List   Diagnosis Date Noted  . Abnormal menses 12/07/2017  . Positive test for herpes simplex virus (HSV)  antibody 04/20/2017  . History of IUFD 05/22/2016   Beaulah Dinning, PT, DPT 11/07/19 11:36 AM  [facility] [address] [place], [place], [address] Phone: [phone]   Fax:  [phone]  Name: Emily Cain MRN: [id number] Date of Birth: 26-Jan-1996

## 2019-11-08 ENCOUNTER — Encounter: Payer: Self-pay | Admitting: Physical Therapy

## 2019-11-08 ENCOUNTER — Ambulatory Visit: Payer: BLUE CROSS/BLUE SHIELD | Attending: Nurse Practitioner | Admitting: Physical Therapy

## 2019-11-08 DIAGNOSIS — M545 Low back pain, unspecified: Secondary | ICD-10-CM

## 2019-11-08 DIAGNOSIS — R252 Cramp and spasm: Secondary | ICD-10-CM | POA: Diagnosis present

## 2019-11-08 DIAGNOSIS — M6281 Muscle weakness (generalized): Secondary | ICD-10-CM | POA: Insufficient documentation

## 2019-11-08 DIAGNOSIS — M542 Cervicalgia: Secondary | ICD-10-CM | POA: Diagnosis present

## 2019-11-08 NOTE — Therapy (Signed)
[facility] [address] [place], [place], [address] Phone: [phone]   Fax:  [phone]  Physical Therapy Treatment  Patient Details  Name: Emily Cain MRN: [id number] Date of Birth: 02/12/1996 Referring Provider (PT): Brooke Falstrau, NP   Encounter Date: 11/08/2019  PT End of Session - 11/08/19 1045    Visit Number  13    Number of Visits  19    Authorization Type  Medicaid    Authorization Time Period  10/29/19-11/25/19    Authorization - Visit Number  2    Authorization - Number of Visits  8    PT Start Time  1040    PT Stop Time  1137    PT Time Calculation (min)  57 min    Activity Tolerance  Patient tolerated treatment well    Behavior During Therapy  [facility] for tasks assessed/performed       Past Medical History:  Diagnosis Date  . Anemia   . Environmental allergies   . History of IUFD     Past Surgical History:  Procedure Laterality Date  . NO PAST SURGERIES      There were no vitals filed for this visit.  Subjective Assessment - 11/08/19 1042    Subjective  About the same as yesterday for neck and back. Did not eat breakfast this AM prior to session so discussed would hold dry needling again, focus on exercises and manual therapy.                       [facility] Adult PT Treatment/Exercise - 11/08/19 0001      Lumbar Exercises: Stretches   Double Knee to Chest Stretch Limitations  15 reps with legs on 55 cm P-ball    Lower Trunk Rotation Limitations  x10 ea. way with legs on 55 cm P-ball    Pelvic Tilt  15 reps    Other Lumbar Stretch Exercise  cat camel x 10 reps, child's pose stretch with UE on 55 cm P-ball 3x10 sec      Lumbar Exercises: Aerobic   Nustep  L5 x 6 min UE/LE      Lumbar Exercises: Standing   Row  AROM;Strengthening;Both;20 reps    Theraband Level (Row)  Level 3 (Green)    Shoulder Extension  AROM;Strengthening;Both;20 reps    Theraband Level (Shoulder Extension)   Level 3 (Green)    Other Standing Lumbar Exercises  pall off press with blue Theraband x 15 ea. direction      Lumbar Exercises: Supine   Clam  15 reps    Clam Limitations  red Theraband    Bent Knee Raise  15 reps    Bent Knee Raise Limitations  with posterior pelvic tilt    Bridge  15 reps    Bridge Limitations  legs on reversed incline wedge    Other Supine Lumbar Exercises  hpi adduction isometric from hooklying iwth small ball 3 sec x 15 reps      Moist Heat Therapy   Number Minutes Moist Heat  10 Minutes    Moist Heat Location  Lumbar Spine      Manual Therapy   Soft tissue mobilization  lumbar paraspinals in prone             PT Education - 11/08/19 1127    Education Details  exercises    Person(s) Educated  Patient    Methods  Explanation;Demonstration;Verbal cues    Comprehension  Returned   demonstration;Verbalized understanding       PT Short Term Goals - 10/19/19 1405      PT SHORT TERM GOAL #1   Title  Pt will be I with HEP for pain relief, gentle ROM and stretching neck and low back/trunk.    Baseline  met/updated today    Time  4    Period  Weeks    Status  Achieved      PT SHORT TERM GOAL #2   Title  Pt will show improved cervical flexion and extension and rotation to allow reading, scanning environment with 25% less pain    Baseline  pain level intermittently higher but met for improved ROM    Time  4    Period  Weeks    Status  Partially Met      PT SHORT TERM GOAL #3   Title  Pt will be able to sit and stand for 20-30 min at a time with pain <5/10    Baseline  now improving but pain intermittently higher    Time  4    Period  Weeks    Status  On-going      PT SHORT TERM GOAL #4   Title  Pt will properly get up and down from sit to supine in order to reduce pain and strain on spine.    Baseline  demos correct logrolling technique    Time  4    Period  Weeks    Status  Achieved        PT Long Term Goals - 10/19/19 1407      PT LONG  TERM GOAL #1   Title  Pt will be able to sit comfortably for meals, playing with daughter with only min increase in neck and back pain    Baseline  progressing but goal ongoing    Time  8    Period  Weeks    Status  On-going    Target Date  11/23/19      PT LONG TERM GOAL #2   Title  Pt will be able to lift, carry daughter as needed without increase in back or neck pain    Baseline  still with difficulty with left grip weakness    Time  8    Period  Weeks    Status  On-going    Target Date  11/23/19      PT LONG TERM GOAL #3   Title  Pt will be I with HEP upon discharge from PT in order to maintain therapy benefit    Baseline  HEP updated today-plan update as needed prior to d/c    Time  8    Period  Weeks    Status  On-going    Target Date  11/23/19      PT LONG TERM GOAL #4   Title  Pt will be able to show proper lifting techniques and body mechanics in order to perform job duties    Baseline  Still has not been able to return to work, instruction in lifting mechanics today    Time  8    Period  Weeks    Status  On-going    Target Date  11/23/19            Plan - 11/08/19 1128    Clinical Impression Statement  Tx. focus lumbar region today with exercises for lumbar ROM and core strengthening progression as well as manual for STM lumbar paraspinals. Still   limited by pain but mild improvement in terms of exercise/activity tolerance from previous status.    Examination-Activity Limitations  Bathing;Squat;Stairs;Lift;Bed Mobility;Bend;Locomotion Level;Stand;Carry;Sit;Sleep;Dressing    Examination-Participation Restrictions  Laundry;Interpersonal Relationship;Community Activity    Stability/Clinical Decision Making  Evolving/Moderate complexity    Clinical Decision Making  Moderate    Rehab Potential  Fair    PT Frequency  2x / week    PT Duration  4 weeks    PT Treatment/Interventions  ADLs/Self Care Home Management;Electrical Stimulation;Therapeutic  activities;Patient/family education;Taping;Therapeutic exercise;Balance training;Passive range of motion;Neuromuscular re-education;Moist Heat;Cryotherapy;Functional mobility training;Manual techniques;Dry needling;Ultrasound;Traction    PT Next Visit Plan  Focus next next session: ROM, stretches, as tolerated progress functional activities for strengthening UE, LE, core strengthening, further dry needling prn, potential traction for cervical region    PT Home Exercise Plan  [id number], posterior pelvic tilts, LTR, SKTC, hip bridge, child's pose stretch, cervical retractions, Theraband row    Consulted and Agree with Plan of Care  Patient       Patient will benefit from skilled therapeutic intervention in order to improve the following deficits and impairments:  Increased fascial restricitons, Pain, Increased muscle spasms, Decreased mobility, Decreased activity tolerance, Decreased range of motion, Decreased strength, Hypomobility, Impaired UE functional use, Impaired flexibility, Increased edema, Decreased balance, Difficulty walking  Visit Diagnosis: Cervicalgia  Cramp and spasm  Muscle weakness (generalized)  Acute bilateral low back pain, unspecified whether sciatica present     Problem List Patient Active Problem List   Diagnosis Date Noted  . Abnormal menses 12/07/2017  . 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 History of IUFD 05/22/2016     , PT, DPT 11/08/19 11:30 AM  [facility] [address] [place], [place], [address] Phone: [phone]   Fax:  [phone]  Name: Camary Bucholz MRN: [id number] Date of Birth: 12/23/1995   

## 2019-11-14 ENCOUNTER — Ambulatory Visit: Payer: BLUE CROSS/BLUE SHIELD | Admitting: Physical Therapy

## 2019-11-16 ENCOUNTER — Other Ambulatory Visit: Payer: Self-pay

## 2019-11-16 ENCOUNTER — Encounter: Payer: Self-pay | Admitting: Physical Therapy

## 2019-11-16 ENCOUNTER — Ambulatory Visit: Payer: BLUE CROSS/BLUE SHIELD | Admitting: Physical Therapy

## 2019-11-16 DIAGNOSIS — M6281 Muscle weakness (generalized): Secondary | ICD-10-CM

## 2019-11-16 DIAGNOSIS — M542 Cervicalgia: Secondary | ICD-10-CM

## 2019-11-16 DIAGNOSIS — R252 Cramp and spasm: Secondary | ICD-10-CM

## 2019-11-16 DIAGNOSIS — M545 Low back pain, unspecified: Secondary | ICD-10-CM

## 2019-11-16 NOTE — Therapy (Signed)
[place], [place], [address] Phone: [phone]   Fax:  [phone]  Physical Therapy Treatment  Patient Details  Name: Emily Cain MRN: [id number] Date of Birth: 17-Mar-1996 Referring Provider (PT): Janese Banks, NP   Encounter Date: 11/16/2019  PT End of Session - 11/16/19 1211    Visit Number  14    Number of Visits  19    Date for PT Re-Evaluation  11/23/19    Authorization Type  Medicaid    Authorization Time Period  10/29/19-11/25/19    Authorization - Visit Number  3    Authorization - Number of Visits  8    PT Start Time  4166    PT Stop Time  1109    PT Time Calculation (min)  54 min    Activity Tolerance  Patient tolerated treatment well    Behavior During Therapy  [facility] for tasks assessed/performed       Past Medical History:  Diagnosis Date  . Anemia   . Environmental allergies   . History of IUFD     Past Surgical History:  Procedure Laterality Date  . NO PAST SURGERIES      There were no vitals filed for this visit.  Subjective Assessment - 11/16/19 1019    Subjective  Pt. reports had cervical MRI which came back (-). She had to miss last therapy visit due to transportation. She reports MD advised second opinion regarding status and may also ben pending EMG/NCS given left UE parasthesias.    Currently in Pain?  Yes    Pain Score  6     Pain Location  Back    Pain Orientation  Lower    Pain Descriptors / Indicators  Tightness;Aching;Sharp    Pain Type  Acute pain    Pain Onset  More than a month ago    Pain Frequency  Constant    Aggravating Factors   bending and prolonged standing or sitting    Pain Relieving Factors  medication and rest    Effect of Pain on Daily Activities  limits positional tolerance and ability bending motions    Pain Score  6    Pain Location  Neck    Pain Orientation  Left    Pain Descriptors / Indicators  Tightness;Pressure    Pain Type  Acute  pain    Pain Radiating Towards  left upper trapezius and left arm into hand    Pain Onset  More than a month ago    Pain Frequency  Constant    Aggravating Factors   left cervical rotation    Pain Relieving Factors  lying down         [facility] PT Assessment - 11/16/19 0001      Special Tests   Other special tests  Adson's test (-)                   [facility] Adult PT Treatment/Exercise - 11/16/19 0001      Neck Exercises: Machines for Strengthening   Nustep  L5 x 5 min UE/LE      Moist Heat Therapy   Number Minutes Moist Heat  10 Minutes    Moist Heat Location  Cervical;Lumbar Spine      Manual Therapy   Joint Mobilization  left 1st rib mobilization in sitting grade I-III    Soft tissue mobilization  left upper trapezius region    Neural Stretch  Left ulnar nerve  floss x 15 reps      Neck Exercises: Stretches   Upper Trapezius Stretch  Left;2 reps;30 seconds    Levator Stretch  Left;2 reps;30 seconds    Other Neck Stretches  supine pec stretch 3 x 20 sec, seated scalene self stretch with strap 3x20 sec, seated self 1st rib mobilization with strap x 10 reps       Trigger Point Dry Needling - 11/16/19 0001    Consent Given?  Yes    Muscles Treated Head and Neck  Upper trapezius;Cervical multifidi   left side, C6 region for multifidi   Muscles Treated Back/Hip  Erector spinae   L4-5 region bilat.   Dry Needling Comments  needling in prone with 32 gauge 30 mm needles for neck and 50 mm needles for low back    Electrical Stimulation Performed with Dry Needling  Yes    E-stim with Dry Needling Details  TENS 20 pps x 10 minutes           PT Education - 11/16/19 1210    Education Details  HEP updates, potential symptom etiology for left UE pain/parasthesias    Person(s) Educated  Patient    Methods  Explanation;Demonstration;Verbal cues;Handout    Comprehension  Verbalized understanding;Returned demonstration       PT Short Term Goals - 10/19/19 1405       PT SHORT TERM GOAL #1   Title  Pt will be I with HEP for pain relief, gentle ROM and stretching neck and low back/trunk.    Baseline  met/updated today    Time  4    Period  Weeks    Status  Achieved      PT SHORT TERM GOAL #2   Title  Pt will show improved cervical flexion and extension and rotation to allow reading, scanning environment with 25% less pain    Baseline  pain level intermittently higher but met for improved ROM    Time  4    Period  Weeks    Status  Partially Met      PT SHORT TERM GOAL #3   Title  Pt will be able to sit and stand for 20-30 min at a time with pain <5/10    Baseline  now improving but pain intermittently higher    Time  4    Period  Weeks    Status  On-going      PT SHORT TERM GOAL #4   Title  Pt will properly get up and down from sit to supine in order to reduce pain and strain on spine.    Baseline  demos correct logrolling technique    Time  4    Period  Weeks    Status  Achieved        PT Long Term Goals - 10/19/19 1407      PT LONG TERM GOAL #1   Title  Pt will be able to sit comfortably for meals, playing with daughter with only min increase in neck and back pain    Baseline  progressing but goal ongoing    Time  8    Period  Weeks    Status  On-going    Target Date  11/23/19      PT LONG TERM GOAL #2   Title  Pt will be able to lift, carry daughter as needed without increase in back or neck pain    Baseline  still with difficulty with left grip weakness  Time  8    Period  Weeks    Status  On-going    Target Date  11/23/19      PT LONG TERM GOAL #3   Title  Pt will be I with HEP upon discharge from PT in order to maintain therapy benefit    Baseline  HEP updated today-plan update as needed prior to d/c    Time  8    Period  Weeks    Status  On-going    Target Date  11/23/19      PT LONG TERM GOAL #4   Title  Pt will be able to show proper lifting techniques and body mechanics in order to perform job duties    Baseline   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 potentially even TOS given tightness in scalene and 1st rib hypomobility noted today so added manual/stretches and updated HEP to address. Resumed dry needling for both neck and back region with good tolerance-pt. ate breakfast prior to session and no nausea reported. Still suspect primarily myofascial etiology for symptoms but will await further response to nerve stretches and scalene stretch + rib self-mobilization for HEP to see if further improvement can be obtained.    Personal Factors and Comorbidities  Social Background    Examination-Activity Limitations  Bathing;Squat;Stairs;Lift;Bed Mobility;Bend;Locomotion Level;Stand;Carry;Sit;Sleep;Dressing    Examination-Participation Restrictions  Laundry;Interpersonal Relationship;Community Activity    Stability/Clinical Decision Making  Evolving/Moderate complexity    Clinical Decision Making  Moderate    Rehab Potential  Fair    PT Frequency  2x / week    PT Duration  4 weeks    PT Treatment/Interventions  ADLs/Self Care Home Management;Electrical Stimulation;Therapeutic activities;Patient/family education;Taping;Therapeutic exercise;Balance training;Passive range of motion;Neuromuscular re-education;Moist Heat;Cryotherapy;Functional mobility training;Manual techniques;Dry needling;Ultrasound;Traction    PT Next Visit Plan  Focus low back vs. neck next session pending response to today's session and region of most pain: check response scalene stretches and rib mobilization for left UE symptoms, as tolerated continue stretches, as tolerated progress functional activities for strengthening UE, LE, core  work, further dry needling prn    PT Home Exercise Plan  [id number], posterior pelvic tilts, LTR, SKTC, hip bridge, child's pose stretch, cervical retractions, Theraband row, scalene stretch, left 1st rib self mobilization    Consulted and Agree with Plan of Care  Patient       Patient will benefit from skilled therapeutic intervention in order to improve the following deficits and impairments:  Increased fascial restricitons, Pain, Increased muscle spasms, Decreased mobility, Decreased activity tolerance, Decreased range of motion, Decreased strength, Hypomobility, Impaired UE functional use, Impaired flexibility, Increased edema, Decreased balance, Difficulty walking  Visit Diagnosis: Cervicalgia  Cramp and spasm  Muscle weakness (generalized)  Acute bilateral low back pain, unspecified whether sciatica present     Problem List Patient Active Problem List   Diagnosis Date Noted  . Abnormal menses 12/07/2017  . 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 History of IUFD 05/22/2016    Beaulah Dinning, PT, DPT 11/16/19 12:19 PM  [facility] [address] [place], [place], [address] Phone: [phone]   Fax:  [phone]  Name: Adjoa Althouse MRN: [id number] Date of Birth: January 22, 1996

## 2019-11-21 ENCOUNTER — Ambulatory Visit: Payer: BLUE CROSS/BLUE SHIELD | Admitting: Physical Therapy

## 2019-11-21 ENCOUNTER — Other Ambulatory Visit: Payer: Self-pay

## 2019-11-21 ENCOUNTER — Encounter: Payer: Self-pay | Admitting: Physical Therapy

## 2019-11-21 DIAGNOSIS — M542 Cervicalgia: Secondary | ICD-10-CM

## 2019-11-21 DIAGNOSIS — M545 Low back pain, unspecified: Secondary | ICD-10-CM

## 2019-11-21 DIAGNOSIS — R252 Cramp and spasm: Secondary | ICD-10-CM

## 2019-11-21 DIAGNOSIS — M6281 Muscle weakness (generalized): Secondary | ICD-10-CM

## 2019-11-21 NOTE — Therapy (Signed)
[place], [place], [address] Phone: [phone]   Fax:  [phone]  Physical Therapy Treatment  Patient Details  Name: Emily Cain MRN: [id number] Date of Birth: 10-05-1995 Referring Provider (PT): Janese Banks, NP   Encounter Date: 11/21/2019  PT End of Session - 11/21/19 1103    Visit Number  15    Number of Visits  19    Date for PT Re-Evaluation  11/23/19    Authorization Type  Medicaid    Authorization Time Period  10/29/19-11/25/19    Authorization - Visit Number  4    Authorization - Number of Visits  8    PT Start Time  4403    PT Stop Time  1139    PT Time Calculation (min)  46 min    Activity Tolerance  Patient tolerated treatment well       Past Medical History:  Diagnosis Date  . Anemia   . Environmental allergies   . History of IUFD     Past Surgical History:  Procedure Laterality Date  . NO PAST SURGERIES      There were no vitals filed for this visit.  Subjective Assessment - 11/21/19 1054    Subjective  Still having trouble with L grip.  Back and neck are a 6/10.    Currently in Pain?  Yes    Pain Score  6     Pain Location  Neck    Pain Orientation  Left    Pain Descriptors / Indicators  Aching;Tightness    Pain Onset  More than a month ago    Pain Score  6    Pain Location  Back       [facility] Adult PT Treatment/Exercise - 11/21/19 0001      Neck Exercises: Standing   Other Standing Exercises  standing self traction with stretch out strapas multple reps and angles       Neck Exercises: Seated   Other Seated Exercise  1st rib mobilization with strap and scalene stretch with towel      Shoulder Exercises: Supine   Horizontal ABduction  Strengthening;Both;10 reps    Theraband Level (Shoulder Horizontal ABduction)  Level 3 (Green)    Shoulder Flexion Weight (lbs)  alternating arms overhead x 10       Moist Heat Therapy   Number Minutes Moist Heat  10 Minutes     Moist Heat Location  Cervical      Manual Therapy   Soft tissue mobilization  left scalenes, pec minor, major, rhomboid , levator scapual and upper trapezius region    Myofascial Release  cervical     Manual Traction  suboccipital release     Neural Stretch  Left ulnar nerve floss      Neck Exercises: Stretches   Corner Stretch  2 reps;30 seconds    Other Neck Stretches  pec stretch over foam roller                PT Short Term Goals - 11/21/19 1145      PT SHORT TERM GOAL #1   Title  Pt will be I with HEP for pain relief, gentle ROM and stretching neck and low back/trunk.    Status  Achieved      PT SHORT TERM GOAL #2   Title  Pt will show improved cervical flexion and extension and rotation to allow reading, scanning environment with 25% less pain    Baseline  pain level intermittently higher but met for improved ROM    Status  Partially Met      PT SHORT TERM GOAL #3   Title  Pt will be able to sit and stand for 20-30 min at a time with pain <5/10    Baseline  pain usually 6.10 or more    Status  On-going      PT SHORT TERM GOAL #4   Title  Pt will properly get up and down from sit to supine in order to reduce pain and strain on spine.    Status  Achieved        PT Long Term Goals - 11/21/19 1146      PT LONG TERM GOAL #1   Title  Pt will be able to sit comfortably for meals, playing with daughter with only min increase in neck and back pain    Baseline  progressing but goal ongoing    Status  On-going      PT LONG TERM GOAL #2   Title  Pt will be able to lift, carry daughter as needed without increase in back or neck pain    Status  On-going      PT LONG TERM GOAL #3   Title  Pt will be I with HEP upon discharge from PT in order to maintain therapy benefit    Status  On-going      PT LONG TERM GOAL #4   Title  Pt will be able to show proper lifting techniques and body mechanics in order to perform job duties    Status  On-going             Plan - 11/21/19 1147    Clinical Impression Statement  Patient continues to have abnormal sensations along L side of neck, arm and palm of hand intermittently. Had not done her 1st rib and scalene stretches. Manual to L upper quarter released tension and reduced symptoms. Will cont to benefit from further PT.    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 neck next session pending response to today's session and region of most pain: check response scalene stretches and rib mobilization for left UE symptoms, as tolerated continue stretches, as tolerated progress functional activities for strengthening UE, LE, core work, further dry needling prn    PT Home Exercise Plan  [id number], posterior pelvic tilts, LTR, SKTC, hip bridge, child's pose stretch, cervical retractions, Theraband row, scalene stretch, left 1st rib self mobilization    Consulted and Agree with Plan of Care  Patient       Patient will benefit from skilled therapeutic intervention in order to improve the following deficits and impairments:  Increased fascial restricitons, Pain, Increased muscle spasms, Decreased mobility, Decreased activity tolerance, Decreased range of motion, Decreased strength, Hypomobility, Impaired UE functional use, Impaired flexibility, Increased edema, Decreased balance, Difficulty walking  Visit Diagnosis: Cervicalgia  Cramp and spasm  Muscle weakness (generalized)  Acute bilateral low back pain, unspecified whether sciatica present     Problem List Patient Active Problem List   Diagnosis Date Noted  . Abnormal menses 12/07/2017  . 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 History of IUFD 05/22/2016    PAA,JENNIFER 11/21/2019, 11:53 AM  [place]  [place], [place], [address] Phone: [phone]   Fax:  [phone]  Name: Emily Cain MRN: [id number] Date of Birth: 1995/11/04  Raeford Razor, PT 11/21/19 11:53 AM Phone: [phone] Fax: [phone]

## 2019-11-23 ENCOUNTER — Ambulatory Visit: Payer: BLUE CROSS/BLUE SHIELD | Admitting: Physical Therapy

## 2019-11-23 ENCOUNTER — Encounter: Payer: Self-pay | Admitting: Physical Therapy

## 2019-11-23 ENCOUNTER — Other Ambulatory Visit: Payer: Self-pay

## 2019-11-23 DIAGNOSIS — M542 Cervicalgia: Secondary | ICD-10-CM | POA: Diagnosis not present

## 2019-11-23 DIAGNOSIS — M545 Low back pain, unspecified: Secondary | ICD-10-CM

## 2019-11-23 DIAGNOSIS — R252 Cramp and spasm: Secondary | ICD-10-CM

## 2019-11-23 DIAGNOSIS — M6281 Muscle weakness (generalized): Secondary | ICD-10-CM

## 2019-11-23 NOTE — Therapy (Signed)
[place], [place], [address] Phone: [phone]   Fax:  [phone]  Physical Therapy Treatment  Patient Details  Name: Emily Cain MRN: [id number] Date of Birth: 1995-12-13 Referring Provider (PT): Janese Banks, NP   Encounter Date: 11/23/2019  PT End of Session - 11/23/19 1421    Visit Number  16    Number of Visits  19    Date for PT Re-Evaluation  11/23/19    Authorization Type  Medicaid    Authorization Time Period  10/29/19-11/25/19    Authorization - Visit Number  5    Authorization - Number of Visits  8    PT Start Time  7482    PT Stop Time  1055    PT Time Calculation (min)  43 min    Activity Tolerance  Patient limited by fatigue    Behavior During Therapy  [facility] for tasks assessed/performed       Past Medical History:  Diagnosis Date  . Anemia   . Environmental allergies   . History of IUFD     Past Surgical History:  Procedure Laterality Date  . NO PAST SURGERIES      There were no vitals filed for this visit.  Subjective Assessment - 11/23/19 1131    Subjective  Pt. reports overall about 85% improvement from baseline at eval. She does note exacerbation of her back pain with activities/doing hair yesterday but back has overall improved. Also doing better with neck pain (no pain this AM). Pt.'s primary current complaint is ongoing issues with left grip weakness. See plan/assessment.    Diagnostic tests  cervical CT, lumbar X-ray, cervical and lumbar MRI    Patient Stated Goals  to get back to the way I was    Currently in Pain?  Yes    Pain Score  7     Pain Location  Back    Pain Orientation  Right;Left;Lower;Mid    Pain Descriptors / Indicators  Aching    Pain Type  Acute pain    Pain Onset  More than a month ago    Pain Frequency  Intermittent    Aggravating Factors   bending and prolonged standing or sitting, activity    Pain Relieving Factors  rest, heat, medication     Effect of Pain on Daily Activities  limits ability for bending and lifting activities, impacts positional tolerance         [facility] PT Assessment - 11/23/19 0001      AROM   Cervical Flexion  48    Cervical Extension  30    Cervical - Right Side Bend  60    Cervical - Left Side Bend  55    Cervical - Right Rotation  55    Cervical - Left Rotation  65    Lumbar Flexion  70    Lumbar Extension  30    Lumbar - Right Side Bend  40    Lumbar - Left Side Bend  40    Lumbar - Right Rotation  WFL    Lumbar - Left Rotation  [facility]      Strength   Strength Assessment Site  Hand    Right Shoulder Flexion  5/5    Right Shoulder ABduction  5/5    Right Shoulder Internal Rotation  5/5    Right Shoulder External Rotation  5/5    Left Shoulder Flexion  5/5    Left Shoulder ABduction  5/5    Left Shoulder Internal Rotation  5/5    Left Shoulder External Rotation  5/5    Right Elbow Flexion  5/5    Right Elbow Extension  5/5    Left Elbow Flexion  5/5    Left Elbow Extension  5/5    Right Wrist Flexion  5/5    Right Wrist Extension  5/5    Left Wrist Flexion  4+/5    Left Wrist Extension  4+/5    Right/Left hand  --   R grip 52 lbs., L grip 19 lbs. with grip dynamonometer                  [facility] Adult PT Treatment/Exercise - 11/23/19 0001      Self-Care   Self-Care  Lifting    Lifting  Instruction and practice lifting technique emphasizing avoidance of rounding back, holding object close to body, avoiding excessive trunk flexion and lifting with legs, also advised avoiding combination bending and twisting with chores      Lumbar Exercises: Stretches   Double Knee to Chest Stretch Limitations  DKTC stretch with legs on 55 cm P-ball x 10 reps    Lower Trunk Rotation Limitations  x 10 reps ea. way bilat. with legs on 55 cm P-ball    Other Lumbar Stretch Exercise  Child's pose stretch with UE on 55 cm P-ball 20 sec x 3      Lumbar Exercises: Aerobic   Nustep  L5 x 6 min  UE/LE      Manual Therapy   Joint Mobilization  left 1st rib mobilization grade I-III in sitting    Neural Stretch  left ulnar nerve floss and glide x 15 ea.             PT Education - 11/23/19 1420    Education Details  POC, lifting technique/mechanics    Person(s) Educated  Patient    Methods  Explanation;Demonstration;Verbal cues    Comprehension  Verbalized understanding;Returned demonstration;Verbal cues required       PT Short Term Goals - 11/23/19 1428      PT SHORT TERM GOAL #1   Title  Pt will be I with HEP for pain relief, gentle ROM and stretching neck and low back/trunk.    Baseline  met    Time  4    Period  Weeks    Status  Achieved      PT SHORT TERM GOAL #2   Title  Pt will show improved cervical flexion and extension and rotation to allow reading, scanning environment with 25% less pain    Baseline  met    Time  4    Period  Weeks    Status  Achieved      PT SHORT TERM GOAL #3   Title  Pt will be able to sit and stand for 20-30 min at a time with pain <5/10    Baseline  back pain intermittently higher than 5/10    Time  4    Period  Weeks    Status  On-going      PT SHORT TERM GOAL #4   Title  Pt will properly get up and down from sit to supine in order to reduce pain and strain on spine.    Baseline  demos correct logrolling technique    Time  4    Period  Weeks    Status  Achieved  PT Long Term Goals - 11/23/19 1429      PT LONG TERM GOAL #1   Title  Pt will be able to sit comfortably for meals, playing with daughter with only min increase in neck and back pain    Baseline  improved but back pain still intermittently higher    Time  8    Period  Weeks    Status  On-going      PT LONG TERM GOAL #2   Title  Pt will be able to lift, carry daughter as needed without increase in back or neck pain    Baseline  still with difficulty with left grip weakness    Time  8    Period  Weeks    Status  On-going      PT LONG TERM GOAL #3    Title  Pt will be I with HEP upon discharge from PT in order to maintain therapy benefit    Baseline  HEP updated, holding PT fror now pending further assessment of left hand weakness    Time  8    Period  Weeks    Status  On-going      PT LONG TERM GOAL #4   Title  Pt will be able to show proper lifting techniques and body mechanics in order to perform job duties    Baseline  Reviewed lifting mechanics today, pt. no longer working previous job and is starting new job without lifting requirements    Time  8    Period  Weeks    Status  Deferred            Plan - 11/23/19 1421    Clinical Impression Statement  Though still with some tendency for intermittent symptoms exacerbation for back>neck pt. has improved with recent therapy with decreased pain. She continues with left hand weakness with left UE parasthesias including in ulnar nerve distribution in left 4th and 5th digit as well as parasthesias on dorsum of hand. Given negative cervical MRI unclear etiology of hand symptoms-differential diagnosis could potentially include brachial plexus symptoms/TOS due to scalene tightness and associated 1st rib hypomobility vs. more distal nerve compression. Recommend further follow up with MD to assess/see if EMG/NCS needed. Plan hold further PT for back and neck and have pt. continue with HEP pending further diagnostics for these symptoms. If local hand/wrist issues suspected she may benefit from treatment by OT hand therapist for more specialization in this area.    Personal Factors and Comorbidities  Social Background    Examination-Activity Limitations  Bathing;Squat;Stairs;Lift;Bed Mobility;Bend;Locomotion Level;Stand;Carry;Sit;Sleep;Dressing    Examination-Participation Restrictions  Laundry;Interpersonal Relationship;Community Activity    Stability/Clinical Decision Making  Evolving/Moderate complexity    Clinical Decision Making  Moderate    Rehab Potential  Fair    PT Frequency  2x /  week    PT Duration  4 weeks    PT Treatment/Interventions  ADLs/Self Care Home Management;Electrical Stimulation;Therapeutic activities;Patient/family education;Taping;Therapeutic exercise;Balance training;Passive range of motion;Neuromuscular re-education;Moist Heat;Cryotherapy;Functional mobility training;Manual techniques;Dry needling;Ultrasound;Traction    PT Next Visit Plan  hold further PT pending further assessment of grip weakness symptoms-if recommended to return to PT would plan re-evaluation on return for further therapy visits vs. if local hand or wrist issue is suspected pt. may benefit from referral to OT hand therapist    PT [place], posterior pelvic tilts, LTR, SKTC, hip bridge, child's pose stretch, cervical retractions, Theraband row, scalene stretch, left 1st rib self mobilization  Consulted and Agree with Plan of Care  Patient       Patient will benefit from skilled therapeutic intervention in order to improve the following deficits and impairments:  Increased fascial restricitons, Pain, Increased muscle spasms, Decreased mobility, Decreased activity tolerance, Decreased range of motion, Decreased strength, Hypomobility, Impaired UE functional use, Impaired flexibility, Increased edema, Decreased balance, Difficulty walking  Visit Diagnosis: Cervicalgia  Cramp and spasm  Muscle weakness (generalized)  Acute bilateral low back pain, unspecified whether sciatica present     Problem List Patient Active Problem List   Diagnosis Date Noted  . Abnormal menses 12/07/2017  . 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 History of IUFD 05/22/2016   Beaulah Dinning, PT, DPT 11/23/19 2:33 PM  [facility] [address] [place], [place], [address] Phone: [phone]   Fax:  [phone]  Name: Emily Cain MRN: [id number] Date of Birth: 23-Jul-1996

## 2019-11-28 ENCOUNTER — Ambulatory Visit: Payer: BLUE CROSS/BLUE SHIELD | Admitting: Physical Therapy

## 2019-11-30 ENCOUNTER — Encounter: Payer: Medicaid Other | Admitting: Physical Therapy
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
## 2020-05-05 ENCOUNTER — Other Ambulatory Visit: Payer: Self-pay

## 2020-05-05 ENCOUNTER — Encounter (HOSPITAL_COMMUNITY): Payer: Self-pay

## 2020-05-05 ENCOUNTER — Emergency Department (HOSPITAL_COMMUNITY)
Admission: EM | Admit: 2020-05-05 | Discharge: 2020-05-05 | Disposition: A | Discharge status: Home / Self Care | Payer: Medicaid Other | Attending: Emergency Medicine | Admitting: Emergency Medicine

## 2020-05-05 DIAGNOSIS — Z87891 Personal history of nicotine dependence: Secondary | ICD-10-CM | POA: Insufficient documentation

## 2020-05-05 DIAGNOSIS — R599 Enlarged lymph nodes, unspecified: Secondary | ICD-10-CM | POA: Diagnosis not present

## 2020-05-05 DIAGNOSIS — R591 Generalized enlarged lymph nodes: Secondary | ICD-10-CM

## 2020-05-05 NOTE — ED Triage Notes (Signed)
Pt arrived via walk in, c/o 'lump" on left side of her neck that appeared x3 days ago. Denies any other sx.

## 2020-05-05 NOTE — ED Provider Notes (Signed)
[facility] LONG EMERGENCY DEPARTMENT Provider Note  CSN: [id number] Arrival date & time: 05/05/20 1406    History Chief Complaint  Patient presents with  . Mass    HPI  Emily Cain is a 24 y.o. female who reports she noticed a small lump on the left side of her neck yesterday. She states it is mildly tender and she can feel it when she turns her head. She received a Pfizer Covid vaccine in her L arm 3 days ago. She denies any fever, bleeding, weight loss, N/V/D, cough, SOB, CP, NVD.    Past Medical History:  Diagnosis Date  . Anemia   . Environmental allergies   . History of IUFD     Past Surgical History:  Procedure Laterality Date  . NO PAST SURGERIES      Family History  Problem Relation Age of Onset  . Cancer Maternal Aunt   . Cancer Maternal Uncle   . Cancer Maternal Grandmother     Social History   Tobacco Use  . Smoking status: Former Smoker    Packs/day: 0.25    Types: Cigarettes  . Smokeless tobacco: Never Used  Vaping Use  . Vaping Use: Never used  Substance Use Topics  . Alcohol use: Yes  . Drug use: No     Home Medications Prior to Admission medications   Medication Sig Start Date End Date Taking? Authorizing Provider  gabapentin (NEURONTIN) 300 MG capsule Take 300 mg by mouth 3 (three) times daily.    [provider]  sertraline (ZOLOFT) 25 MG tablet Take 1 tablet (25 mg total) by mouth daily. Patient not taking: Reported on 01/21/2018 07/05/17 04/26/19  Marny Lowenstein, PA-C     Allergies    Methadone   Review of Systems   Review of Systems A comprehensive review of systems was completed and negative except as noted in HPI.    Physical Exam BP 111/84 (BP Location: Right Arm)   Pulse 85   Temp 99.3 F (37.4 C) (Oral)   Resp 16   Ht 5' 7.5" (1.715 m)   Wt 84.8 kg   SpO2 99%   BMI 28.86 kg/m   Physical Exam Vitals and nursing note reviewed.  Constitutional:      Appearance: Normal appearance.  HENT:     Head:  Normocephalic and atraumatic.     Nose: Nose normal.     Mouth/Throat:     Mouth: Mucous membranes are moist.  Eyes:     Extraocular Movements: Extraocular movements intact.     Conjunctiva/sclera: Conjunctivae normal.  Neck:     Comments: Patient has a solitary ~1cm mobile lymph node in L anterior cervical chain, not tender to palpation.  Cardiovascular:     Rate and Rhythm: Normal rate.  Pulmonary:     Effort: Pulmonary effort is normal.     Breath sounds: Normal breath sounds.  Abdominal:     General: Abdomen is flat.     Palpations: Abdomen is soft.     Tenderness: There is no abdominal tenderness.  Musculoskeletal:        General: No swelling. Normal range of motion.     Cervical back: Neck supple.  Skin:    General: Skin is warm and dry.  Neurological:     General: No focal deficit present.     Mental Status: She is alert.  Psychiatric:        Mood and Affect: Mood normal.      ED Results /  Procedures / Treatments   Labs (all labs ordered are listed, but only abnormal results are displayed) Labs Reviewed - No data to display  EKG None  Radiology No results found.  Procedures Procedures  Medications Ordered in the ED Medications - No data to display   MDM Rules/Calculators/A&P MDM Patient with a likely reactive lymph node to recent Covid vaccine but no other concerning symptoms. Advised to watch it closely for the next few days, RTED if her symptoms worsening to include enlarging mass, fever, SOB, sore throat, trouble swallowing or any other concerns. PCP follow up.  ED Course  I have reviewed the triage vital signs and the nursing notes.  Pertinent labs & imaging results that were available during my care of the patient were reviewed by me and considered in my medical decision making (see chart for details).     Final Clinical Impression(s) / ED Diagnoses Final diagnoses:  Lymphadenopathy    Rx / DC Orders ED Discharge Orders    None         Pollyann Savoy, MD 05/05/20 1719

## 2020-05-05 NOTE — ED Notes (Signed)
Pt verbalized dc instructions and follow up care. Alert and ambulatory. No iv. 

## 2020-05-14 ENCOUNTER — Ambulatory Visit (HOSPITAL_COMMUNITY)
Admission: EM | Admit: 2020-05-14 | Discharge: 2020-05-14 | Disposition: A | Discharge status: Home / Self Care | Payer: Medicaid Other

## 2020-05-14 ENCOUNTER — Other Ambulatory Visit: Payer: Self-pay

## 2020-05-31 ENCOUNTER — Emergency Department (HOSPITAL_BASED_OUTPATIENT_CLINIC_OR_DEPARTMENT_OTHER)
Admission: EM | Admit: 2020-05-31 | Discharge: 2020-05-31 | Disposition: A | Discharge status: Home / Self Care | Payer: Medicaid Other | Attending: Emergency Medicine | Admitting: Emergency Medicine

## 2020-05-31 ENCOUNTER — Encounter (HOSPITAL_BASED_OUTPATIENT_CLINIC_OR_DEPARTMENT_OTHER): Payer: Self-pay | Admitting: *Deleted

## 2020-05-31 ENCOUNTER — Other Ambulatory Visit: Payer: Self-pay

## 2020-05-31 DIAGNOSIS — R42 Dizziness and giddiness: Secondary | ICD-10-CM | POA: Insufficient documentation

## 2020-05-31 DIAGNOSIS — Z87891 Personal history of nicotine dependence: Secondary | ICD-10-CM | POA: Insufficient documentation

## 2020-05-31 DIAGNOSIS — N939 Abnormal uterine and vaginal bleeding, unspecified: Secondary | ICD-10-CM | POA: Diagnosis present

## 2020-05-31 DIAGNOSIS — R200 Anesthesia of skin: Secondary | ICD-10-CM | POA: Diagnosis not present

## 2020-05-31 DIAGNOSIS — N938 Other specified abnormal uterine and vaginal bleeding: Secondary | ICD-10-CM | POA: Diagnosis not present

## 2020-05-31 LAB — URINALYSIS, ROUTINE W REFLEX MICROSCOPIC
Bilirubin Urine: NEGATIVE
Glucose, UA: NEGATIVE mg/dL
Ketones, ur: NEGATIVE mg/dL
Leukocytes,Ua: NEGATIVE
Nitrite: NEGATIVE
Protein, ur: NEGATIVE mg/dL
Specific Gravity, Urine: 1.015 (ref 1.005–1.030)
pH: >9 — ABNORMAL HIGH (ref 5.0–8.0)

## 2020-05-31 LAB — URINALYSIS, MICROSCOPIC (REFLEX)

## 2020-05-31 LAB — PREGNANCY, URINE: Preg Test, Ur: NEGATIVE

## 2020-05-31 LAB — CBC
HCT: 37.2 % (ref 36.0–46.0)
Hemoglobin: 12.3 g/dL (ref 12.0–15.0)
MCH: 28.1 pg (ref 26.0–34.0)
MCHC: 33.1 g/dL (ref 30.0–36.0)
MCV: 84.9 fL (ref 80.0–100.0)
Platelets: 215 10*3/uL (ref 150–400)
RBC: 4.38 MIL/uL (ref 3.87–5.11)
RDW: 13.2 % (ref 11.5–15.5)
WBC: 4.5 10*3/uL (ref 4.0–10.5)
nRBC: 0 % (ref 0.0–0.2)

## 2020-05-31 LAB — WET PREP, GENITAL
Sperm: NONE SEEN
Trich, Wet Prep: NONE SEEN
Yeast Wet Prep HPF POC: NONE SEEN

## 2020-05-31 LAB — HIV ANTIBODY (ROUTINE TESTING W REFLEX): HIV Screen 4th Generation wRfx: NONREACTIVE

## 2020-05-31 MED ORDER — MEDROXYPROGESTERONE ACETATE 10 MG PO TABS: 10.0000 mg | ORAL_TABLET | Freq: Every day | ORAL | 0 refills | Status: DC

## 2020-05-31 MED ORDER — NAPROXEN 500 MG PO TABS: 500.0000 mg | ORAL_TABLET | Freq: Two times a day (BID) | ORAL | 0 refills | Status: DC

## 2020-05-31 NOTE — ED Provider Notes (Signed)
[facility] EMERGENCY DEPARTMENT Provider Note   CSN: [id number] Arrival date & time: 05/31/20  1647     History Chief Complaint  Patient presents with  . Numbness  . Vaginal Bleeding    Emily Cain is a 24 y.o. female with PMH of abnormal uterine bleeding who presents the ED with complaints of heavy vaginal bleeding.  Patient is also complaining of numbness.  She is followed by her primary care provider, Aleatha Borer NP.  On my examination, she states that she has been experiencing heavy abnormal uterine bleeding x1 month.  She states that she has had dysfunctional bleeding in the past, but it has been worse in recent weeks.  She states that it is causing her to have cold extremities but also feel "tingly numb".  She denies any actually diminished sensation.  No weakness.  She is concerned that she has bled too much that she is causing anemia.  She is also endorsing lightheadedness intermittently.  She is accompanied by her young daughter who was at bedside.  Patient also states that she has been sexually active with multiple partners in the past few months and does not use protection.  She is requesting STI testing, including HIV and RPR.  She denies any abnormal vaginal discharge.  She was sexually active most recently 1 week ago, even in the setting of her reported dysfunctional uterine bleeding, and denies any dyspareunia.  She does not use any hormonal contraceptive therapy.  Lastly, patient endorses urine smelling of "ammonia" and is concerned for UTI.  She is uncertain as to whether or not she could perhaps be pregnant.  She denies any fevers, chills, abdominal pain, nausea or vomiting, abnormal vaginal discharge, dyspareunia, pelvic pain.  No history of bleeding disorders.  She is not on anticoagulation.  Patient reports that she has a scheduled appoint with OB/GYN for 06/05/2020.  HPI     Past Medical History:  Diagnosis Date  . Anemia   . Environmental  allergies   . History of IUFD     Patient Active Problem List   Diagnosis Date Noted  . Abnormal menses 12/07/2017  . 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 NO PAST SURGERIES       OB History    Gravida  2   Para  2   Term  2   Preterm  0   AB  0   Living  1     SAB  0   TAB  0   Ectopic  0   Multiple  0   Live Births  1           Family History  Problem Relation Age of Onset  . Cancer Maternal Aunt   . Cancer Maternal Uncle   . Cancer Maternal Grandmother     Social History   Tobacco Use  . Smoking status: Former Smoker    Packs/day: 0.25    Types: Cigarettes  . Smokeless tobacco: Never Used  Vaping Use  . Vaping Use: Never used  Substance Use Topics  . Alcohol use: Not Currently  . Drug use: No    Home Medications Prior to Admission medications   Medication Sig Start Date End Date Taking? Authorizing Provider  gabapentin (NEURONTIN) 300 MG capsule Take 300 mg by mouth 3 (three) times daily.    [provider]  medroxyPROGESTERone (PROVERA)  10 MG tablet Take 1 tablet (10 mg total) by mouth daily for 10 days. 05/31/20 06/10/20  Lorelee New, PA-C  naproxen (NAPROSYN) 500 MG tablet Take 1 tablet (500 mg total) by mouth 2 (two) times daily. 05/31/20   Lorelee New, PA-C  sertraline (ZOLOFT) 25 MG tablet Take 1 tablet (25 mg total) by mouth daily. Patient not taking: Reported on 01/21/2018 07/05/17 04/26/19  Marny Lowenstein, PA-C    Allergies    Methadone  Review of Systems   Review of Systems  All other systems reviewed and are negative.   Physical Exam Updated Vital Signs BP 110/70 (BP Location: Right Arm)   Pulse 86   Temp 98.9 F (37.2 C)   Resp 16   Ht 5' 7.5" (1.715 m)   Wt 84.8 kg   LMP 05/18/2020   SpO2 99%   Breastfeeding No   BMI 28.86 kg/m   Physical Exam Vitals and nursing note  reviewed. Exam conducted with a chaperone present.  Constitutional:      General: She is not in acute distress.    Appearance: Normal appearance. She is not ill-appearing.  HENT:     Head: Normocephalic and atraumatic.  Eyes:     General: No scleral icterus.    Conjunctiva/sclera: Conjunctivae normal.  Cardiovascular:     Rate and Rhythm: Normal rate.     Pulses: Normal pulses.  Pulmonary:     Effort: Pulmonary effort is normal. No respiratory distress.  Abdominal:     General: Abdomen is flat. There is no distension.     Palpations: Abdomen is soft.     Tenderness: There is no guarding.     Comments: Soft, nondistended.  Mild suprapubic TTP.  No tenderness elsewhere.  No organomegaly.  No palpable uterus.  No masses.  Genitourinary:    Comments: Speculum exam: Pink cervix.  Moderate bleeding noted in vaginal vault.  No obvious traumatic laceration. Bimanual exam: No significant adnexal or cervical motion tenderness. Musculoskeletal:        General: Normal range of motion.     Cervical back: Normal range of motion.  Skin:    General: Skin is dry.     Capillary Refill: Capillary refill takes less than 2 seconds.     Coloration: Skin is not pale.  Neurological:     General: No focal deficit present.     Mental Status: She is alert and oriented to person, place, and time.     GCS: GCS eye subscore is 4. GCS verbal subscore is 5. GCS motor subscore is 6.     Cranial Nerves: No cranial nerve deficit.     Sensory: No sensory deficit.     Motor: No weakness.     Coordination: Coordination normal.     Gait: Gait normal.     Comments: Sensation intact and symmetric throughout.  Strength intact and symmetric aside from mildly diminished left hand grip strength (chronic).  PERRL and EOM intact.  No difficulty ambulating.  Psychiatric:        Mood and Affect: Mood normal.        Behavior: Behavior normal.        Thought Content: Thought content normal.     ED Results / Procedures /  Treatments   Labs (all labs ordered are listed, but only abnormal results are displayed) Labs Reviewed  WET PREP, GENITAL - Abnormal; Notable for the following components:      Result Value   Clue Cells  Wet Prep HPF POC PRESENT (*)    WBC, Wet Prep HPF POC FEW (*)    All other components within normal limits  URINALYSIS, ROUTINE W REFLEX MICROSCOPIC - Abnormal; Notable for the following components:   APPearance CLOUDY (*)    pH >9.0 (*)    Hgb urine dipstick TRACE (*)    All other components within normal limits  URINALYSIS, MICROSCOPIC (REFLEX) - Abnormal; Notable for the following components:   Bacteria, UA MANY (*)    All other components within normal limits  URINE CULTURE  CBC  PREGNANCY, URINE  RPR  HIV ANTIBODY (ROUTINE TESTING W REFLEX)  GC/CHLAMYDIA PROBE AMP ([place]    EKG None  Radiology No results found.  Procedures Procedures (including critical care time)  Medications Ordered in ED Medications - No data to display  ED Course  I have reviewed the triage vital signs and the nursing notes.  Pertinent labs & imaging results that were available during my care of the patient were reviewed by me and considered in my medical decision making (see chart for details).    MDM Rules/Calculators/A&P                          DDx:    Structural - (fibroids, adenomyosis, polyps)       Non-Structural - (coagulopathy, ovulatory dysfunction, malignancy)  The most common cause of abnormal vaginal bleeding is to be uterine bleeding related to ovulatory dysfunction.  Patient denies any significant postcoital pain or bleeding concerning for traumatic laceration.  No abnormal bruising, bleeding, or family history of coagulopathies.  No petechiae on physical exam and do not feel as though PT/INR or clotting work-up is warranted at this time.  CBC without any evidence of anemia.  She is hemodynamically stable with normal heart rate and blood pressures.  Given  her reassuring physical exam and laboratory work-up, do not feel as though emergent pelvic US is warranted.   HIV, RPR, and GC laboratory work-up is all pending.  Will not treat empirically given her primary concern was abnormal uterine bleeding and she denies any dyspareunia or abnormal vaginal discharge.  She simply was requesting testing given her new partners.  Discussed safe sex practice.  Patient has [employer]el at home for which she uses for periodic bacterial vaginosis.  Do not feel as though treatment is warranted despite clue cells on wet prep given her lack of symptoms.  Urine pregnancy is negative.  UA is negative nitrite and also negative for leukocytes.  The reflex microscopic shows 6-10 WBC and bacteria, but there are also squamous epithelium seen.  I am not convinced that this is a urinary tract infection and will hold off on antibiotics at this time, particularly given her recurrent BV.  Discussed this with patient and she agrees.  Patient will follow up with her OB/GYN for ongoing evaluation and management of her dysfunctional uterine bleeding.  Will treat with Provera 10 mg daily x10 to 14 days as well as Naproxen 500 mg BID as NSAIDs have been shown to decrease cramping and bleeding by 50%.    She would likely benefit from hormonal contraceptive.  Given that she is hemodynamically stable, rather than initiate oral contraceptive here in the ED, I prefer that she have this discussion with her OB/GYN given scheduled appointment 06/05/2020.  Patient agrees with this plan.    Strict ED return precautions discussed.  All of the evaluation and work-up  results were discussed with the patient and any family at bedside. They were provided opportunity to ask any additional questions and have none at this time. They have expressed understanding of verbal discharge instructions as well as return precautions and are agreeable to the plan.    Final Clinical Impression(s) / ED Diagnoses Final diagnoses:   Dysfunctional uterine bleeding    Rx / DC Orders ED Discharge Orders         Ordered    medroxyPROGESTERone (PROVERA) 10 MG tablet  Daily        05/31/20 1918    naproxen (NAPROSYN) 500 MG tablet  2 times daily        05/31/20 1918           Elvera Maria 05/31/20 1920    Pollyann Savoy, MD 05/31/20 2256

## 2020-05-31 NOTE — Discharge Instructions (Addendum)
Please take the Provera and naproxen to help with your dysfunctional uterine bleeding.  While Provera is a hormonal medication, this dose is designed to help curb your bleeding and does not afford you protection from pregnancy.  You will need to use other contraceptive management if you do not want to get pregnant.  You will need to follow-up with your OB/GYN for your appointment, as scheduled, 06/05/2020.  You may wish to consider hormonal contraceptive therapy as a means to help regulate your cycles and curb abnormal uterine bleeding.  If your bleeding fails to improve with hormonal therapy, you may ultimately benefit from outpatient pelvic ultrasound to evaluate for structural abnormalities.  Your vital signs laboratory work-up is reassuring.  You have also been tested for HIV, syphilis, and gonorrhea/chlamydia.  Please abstain from sexual intercourse pending results of your testing.  You have not been treated empirically given your lack of symptoms otherwise concerning for sexual transmitted infection.  In addition to your OB/GYN, please also follow-up with your primary care provider regarding today's encounter.  Return to the ED or seek immediate medical attention should you experience any new or worsening symptoms.

## 2020-05-31 NOTE — ED Triage Notes (Signed)
Pt reports numbness to both feet today. Reports she has had very heavy vaginal bleeding since mid October. States she also feels lightheaded

## 2020-06-01 LAB — RPR: RPR Ser Ql: NONREACTIVE

## 2020-06-02 LAB — GC/CHLAMYDIA PROBE AMP (~~LOC~~) NOT AT ARMC
Chlamydia: POSITIVE — AB
Comment: NEGATIVE
Comment: NORMAL
Neisseria Gonorrhea: NEGATIVE

## 2020-06-02 LAB — URINE CULTURE: Culture: 1000 — AB

## 2020-06-03 ENCOUNTER — Telehealth: Payer: Self-pay | Admitting: Emergency Medicine

## 2020-06-03 NOTE — Telephone Encounter (Signed)
Post ED Visit - Positive Culture Follow-up  Culture report reviewed by antimicrobial stewardship pharmacist: [facility] []  , Pharm.D. []  Enzo Bi, Pharm.D., BCPS AQ-ID []  , Pharm.D., BCPS []  Celedonio Miyamoto, Pharm.D., BCPS []  [place], Garvin Fila.D., BCPS, AAHIVP []  , Pharm.D., BCPS, AAHIVP []  Georgina Pillion, PharmD, BCPS []  , PharmD, BCPS []  [place], PharmD, BCPS []  [address], PharmD []  , PharmD, BCPS []  Estella Husk, PharmD [place] PharmD  [facility] []  , PharmD []  Phillips Climes, PharmD []  , PharmD []  Agapito Games, Rph []  ) Verlan Friends, PharmD []  , PharmD []  Mervyn Gay, PharmD []  , PharmD []  Vinnie Level, PharmD []  Francene Finders, PharmD []  [facility], PharmD []  , PharmD []  Len Childs, PharmD   Positive urine culture Treated with none, likely colonized,  no further patient follow-up is required at this time.  06/03/2020, 1:13 PM

## 2020-06-03 NOTE — Progress Notes (Signed)
ED Antimicrobial Stewardship Positive Culture Follow Up   Emily Cain is an 24 y.o. female who presented to [facility] on 05/31/2020 with a chief complaint of  Chief Complaint  Patient presents with   Numbness   Vaginal Bleeding    Recent Results (from the past 720 hour(s))  Wet prep, genital     Status: Abnormal   Collection Time: 05/31/20  6:26 PM   Specimen: PATH Cytology Cervicovaginal Ancillary Only  Result Value Ref Range Status   Yeast Wet Prep HPF POC NONE SEEN NONE SEEN Final   Trich, Wet Prep NONE SEEN NONE SEEN Final   Clue Cells Wet Prep HPF POC PRESENT (A) NONE SEEN Final   WBC, Wet Prep HPF POC FEW (A) NONE SEEN Final   Sperm NONE SEEN  Final    Comment: Performed at [facility], [address] [place], [place] [address]  Urine culture     Status: Abnormal   Collection Time: 05/31/20  6:27 PM   Specimen: Urine, Clean Catch  Result Value Ref Range Status   Specimen Description   Final    URINE, CLEAN CATCH Performed at [facility], [address] [place], [place] [address]    Special Requests   Final    NONE Performed at [facility], [address] [place], [place] [address]    Culture (A)  Final    1,000 COLONIES/mL GROUP B STREP(S.AGALACTIAE)ISOLATED TESTING AGAINST S. AGALACTIAE NOT ROUTINELY PERFORMED DUE TO PREDICTABILITY OF AMP/PEN/VAN SUSCEPTIBILITY. Performed at [facility], [address], [place], [place] [address]    Report Status 06/02/2020 FINAL  Final    [x]  Patient discharged originally without antimicrobial agent  New antibiotic prescription: No further treatment indicated - very low colony count and likely colonizer.  ED Provider: , PA-C   Army Melia Dohlen 06/03/2020, 7:51 AM Clinical Pharmacist Monday - Friday phone -  [phone] Saturday - Sunday phone - [phone]

## 2020-06-05 ENCOUNTER — Other Ambulatory Visit: Payer: Self-pay

## 2020-06-05 ENCOUNTER — Encounter (HOSPITAL_BASED_OUTPATIENT_CLINIC_OR_DEPARTMENT_OTHER): Payer: Self-pay | Admitting: Emergency Medicine

## 2020-06-05 ENCOUNTER — Emergency Department (HOSPITAL_BASED_OUTPATIENT_CLINIC_OR_DEPARTMENT_OTHER)
Admission: EM | Admit: 2020-06-05 | Discharge: 2020-06-05 | Disposition: A | Discharge status: Home / Self Care | Payer: Medicaid Other | Attending: Emergency Medicine | Admitting: Emergency Medicine

## 2020-06-05 DIAGNOSIS — A64 Unspecified sexually transmitted disease: Secondary | ICD-10-CM

## 2020-06-05 DIAGNOSIS — N939 Abnormal uterine and vaginal bleeding, unspecified: Secondary | ICD-10-CM | POA: Insufficient documentation

## 2020-06-05 DIAGNOSIS — Z202 Contact with and (suspected) exposure to infections with a predominantly sexual mode of transmission: Secondary | ICD-10-CM | POA: Diagnosis not present

## 2020-06-05 DIAGNOSIS — Z87891 Personal history of nicotine dependence: Secondary | ICD-10-CM | POA: Insufficient documentation

## 2020-06-05 MED ORDER — DOXYCYCLINE HYCLATE 100 MG PO CAPS: 100.0000 mg | ORAL_CAPSULE | Freq: Two times a day (BID) | ORAL | 0 refills | Status: AC

## 2020-06-05 MED ORDER — CEFTRIAXONE SODIUM 500 MG IJ SOLR
500.0000 mg | Freq: Once | INTRAMUSCULAR | Status: AC
  Administered 2020-06-05: 500 mg via INTRAMUSCULAR
  Filled 2020-06-05: qty 500

## 2020-06-05 NOTE — ED Provider Notes (Signed)
[facility] EMERGENCY DEPARTMENT Provider Note   CSN: [id number] Arrival date & time: 06/05/20  2001     History Chief Complaint  Patient presents with  . SEXUALLY TRANSMITTED DISEASE    Emily Cain is a 24 y.o. female.  HPI   24 year old female the history of anemia, environmental allergies, who presents emergency department today for evaluation requesting treatment for STDs.  Patient was seen 05/31/2020 for evaluation of vaginal bleeding.  At that time she had STD testing.  Patient tested positive for chlamydia and she is requesting treatment at this time.  She is asymptomatic at this time and denies any pelvic pain, vaginal discharge, fevers, painful intercourse or other symptoms.  Past Medical History:  Diagnosis Date  . Anemia   . Environmental allergies   . History of IUFD     Patient Active Problem List   Diagnosis Date Noted  . Abnormal menses 12/07/2017  . 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 NO PAST SURGERIES       OB History    Gravida  2   Para  2   Term  2   Preterm  0   AB  0   Living  1     SAB  0   TAB  0   Ectopic  0   Multiple  0   Live Births  1           Family History  Problem Relation Age of Onset  . Cancer Maternal Aunt   . Cancer Maternal Uncle   . Cancer Maternal Grandmother     Social History   Tobacco Use  . Smoking status: Former Smoker    Packs/day: 0.25    Types: Cigarettes  . Smokeless tobacco: Never Used  Vaping Use  . Vaping Use: Never used  Substance Use Topics  . Alcohol use: Not Currently  . Drug use: No    Home Medications Prior to Admission medications   Medication Sig Start Date End Date Taking? Authorizing Provider  doxycycline (VIBRAMYCIN) 100 MG capsule Take 1 capsule (100 mg total) by mouth 2 (two) times daily for 7 days. 06/05/20 06/12/20  Ria Redcay S,  PA-C  gabapentin (NEURONTIN) 300 MG capsule Take 300 mg by mouth 3 (three) times daily.    [provider]  medroxyPROGESTERone (PROVERA) 10 MG tablet Take 1 tablet (10 mg total) by mouth daily for 10 days. 05/31/20 06/10/20  Lorelee New, PA-C  metroNIDAZOLE (METROGEL) 0.75 % vaginal gel Place vaginally 2 (two) times daily. 05/31/20   [provider]  naproxen (NAPROSYN) 500 MG tablet Take 1 tablet (500 mg total) by mouth 2 (two) times daily. 05/31/20   Lorelee New, PA-C  sertraline (ZOLOFT) 25 MG tablet Take 1 tablet (25 mg total) by mouth daily. Patient not taking: Reported on 01/21/2018 07/05/17 04/26/19  Marny Lowenstein, PA-C    Allergies    Methadone  Review of Systems   Review of Systems  Constitutional: Negative for fever.  Eyes: Negative for visual disturbance.  Respiratory: Negative for shortness of breath.   Cardiovascular: Negative for chest pain.  Gastrointestinal: Negative for abdominal pain, constipation, diarrhea, nausea and vomiting.  Genitourinary: Negative for pelvic pain, vaginal bleeding and vaginal discharge.  Musculoskeletal: Negative for back pain.    Physical Exam Updated Vital Signs BP 115/71 (BP Location: Right Arm)  Pulse 100   Temp 98 F (36.7 C) (Oral)   Resp 18   Wt 85.1 kg   LMP 05/18/2020   SpO2 97%   BMI 28.95 kg/m   Physical Exam Vitals and nursing note reviewed.  Constitutional:      General: She is not in acute distress.    Appearance: She is well-developed.  HENT:     Head: Normocephalic and atraumatic.  Eyes:     Conjunctiva/sclera: Conjunctivae normal.  Cardiovascular:     Rate and Rhythm: Normal rate.  Pulmonary:     Effort: Pulmonary effort is normal.  Abdominal:     Palpations: Abdomen is soft.     Tenderness: There is no abdominal tenderness. There is no guarding or rebound.  Genitourinary:    Comments: deferred Musculoskeletal:        General: Normal range of motion.     Cervical back: Neck  supple.  Skin:    General: Skin is warm and dry.  Neurological:     Mental Status: She is alert.     ED Results / Procedures / Treatments   Labs (all labs ordered are listed, but only abnormal results are displayed) Labs Reviewed - No data to display  EKG None  Radiology No results found.  Procedures Procedures (including critical care time)  Medications Ordered in ED Medications  cefTRIAXone (ROCEPHIN) injection 500 mg (has no administration in time range)    ED Course  I have reviewed the triage vital signs and the nursing notes.  Pertinent labs & imaging results that were available during my care of the patient were reviewed by me and considered in my medical decision making (see chart for details).    MDM Rules/Calculators/A&P                          24 year old female presenting emergency department today requesting treatment for chlamydia.  Was seen 05/31/2020 and had testing completed which showed positive gonorrhea.  We will treat for both gonorrhea/chlamydia and give ceftriaxone and doxycycline.  She is asymptomatic at this time and I believe suspicion for PID or other emergent concerns at this time.  Patient given information on safe sex practices and advised on follow-up and return precautions.  She voices understanding of plan and reasons to return.  All Questions answered.  Patient stable for discharge  Final Clinical Impression(s) / ED Diagnoses Final diagnoses:  STD (female)    Rx / DC Orders ED Discharge Orders         Ordered    doxycycline (VIBRAMYCIN) 100 MG capsule  2 times daily        06/05/20 2045           Rayne Du 06/05/20 2046    Benjiman Core, MD 06/06/20 0001

## 2020-06-05 NOTE — Discharge Instructions (Signed)
You were given a prescription for antibiotics. Please take the antibiotic prescription fully.   Avoid sexual contact until 1 week after completion of your antibiotic, and please inform all sexual partners of a positive STD test.  Please follow up with your primary care provider within 5-7 days for re-evaluation of your symptoms. If you do not have a primary care provider, information for a healthcare clinic has been provided for you to make arrangements for follow up care. Please return to the emergency department for any new or worsening symptoms.

## 2020-06-05 NOTE — ED Triage Notes (Signed)
Positive chlamydia test on 10/23 in our ED. Comes back for antibiotics.

## 2020-07-06 ENCOUNTER — Encounter (HOSPITAL_COMMUNITY): Payer: Self-pay | Admitting: Emergency Medicine

## 2020-07-06 ENCOUNTER — Other Ambulatory Visit: Payer: Self-pay

## 2020-07-06 ENCOUNTER — Ambulatory Visit (HOSPITAL_COMMUNITY)
Admission: EM | Admit: 2020-07-06 | Discharge: 2020-07-06 | Disposition: A | Discharge status: Home / Self Care | Payer: Medicaid Other | Attending: Family Medicine | Admitting: Family Medicine

## 2020-07-06 DIAGNOSIS — N760 Acute vaginitis: Secondary | ICD-10-CM | POA: Diagnosis not present

## 2020-07-06 MED ORDER — FLUCONAZOLE 150 MG PO TABS: 150.0000 mg | ORAL_TABLET | Freq: Once | ORAL | 0 refills | Status: AC

## 2020-07-06 MED ORDER — METRONIDAZOLE 0.75 % VA GEL: 1.0000 | Freq: Two times a day (BID) | VAGINAL | 0 refills | Status: DC

## 2020-07-06 NOTE — ED Triage Notes (Signed)
Vaginal discharge and odor for a month Patient was diagnosed with chlamydia at another facility.  Patient has not had transportation to seek medical attention

## 2020-07-06 NOTE — ED Provider Notes (Signed)
[facility]    CSN: [id number] Arrival date & time: 07/06/20  1624      History   Chief Complaint Chief Complaint  Patient presents with  . Vaginal Discharge    HPI Emily Cain is a 24 y.o. female.   HPI Patient presents today with a concern of vaginal discharge.  Patient reports that she was treated for chlamydia about 30 days ago and completed most but not all of the doxycycline.  She reports following a few doses of the doxycycline she developed some vaginal irritation.  She requests repeat STD testing to ensure that infection resolved.  She also reports that she has a history of recurrent BV and wonders if symptoms are related to a recurrent BV infection.  Denies any abdominal pain, nausea vomiting or diarrhea.  Patient's last menstrual cycle was 06/27/2020. Past Medical History:  Diagnosis Date  . Anemia   . Environmental allergies   . History of IUFD     Patient Active Problem List   Diagnosis Date Noted  . Abnormal menses 12/07/2017  . 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 NO PAST SURGERIES      OB History    Gravida  2   Para  2   Term  2   Preterm  0   AB  0   Living  1     SAB  0   TAB  0   Ectopic  0   Multiple  0   Live Births  1            Home Medications    Prior to Admission medications   Medication Sig Start Date End Date Taking? Authorizing Provider  gabapentin (NEURONTIN) 300 MG capsule Take 300 mg by mouth 3 (three) times daily.    [provider]  medroxyPROGESTERone (PROVERA) 10 MG tablet Take 1 tablet (10 mg total) by mouth daily for 10 days. 05/31/20 06/10/20  Lorelee New, PA-C  metroNIDAZOLE (METROGEL) 0.75 % vaginal gel Place vaginally 2 (two) times daily. 05/31/20   [provider]  naproxen (NAPROSYN) 500 MG tablet Take 1 tablet (500 mg total) by mouth 2 (two) times  daily. 05/31/20   Lorelee New, PA-C  sertraline (ZOLOFT) 25 MG tablet Take 1 tablet (25 mg total) by mouth daily. Patient not taking: Reported on 01/21/2018 07/05/17 04/26/19  Marny Lowenstein, PA-C    Family History Family History  Problem Relation Age of Onset  . Cancer Maternal Aunt   . Cancer Maternal Uncle   . Cancer Maternal Grandmother     Social History Social History   Tobacco Use  . Smoking status: Former Smoker    Packs/day: 0.25    Types: Cigarettes  . Smokeless tobacco: Never Used  Vaping Use  . Vaping Use: Never used  Substance Use Topics  . Alcohol use: Not Currently  . Drug use: No     Allergies   Methadone   Review of Systems Review of Systems Pertinent negatives listed in HPI   Physical Exam Triage Vital Signs ED Triage Vitals  Enc Vitals Group     BP 07/06/20 1707 118/83     Pulse Rate 07/06/20 1707 82     Resp 07/06/20 1707 18     Temp 07/06/20 1707 98.3 F (36.8 C)     Temp Source 07/06/20 1707 Oral  SpO2 07/06/20 1707 98 %     Weight --      Height --      Head Circumference --      Peak Flow --      Pain Score 07/06/20 1704 0     Pain Loc --      Pain Edu? --      Excl. in GC? --    No data found.  Updated Vital Signs BP 118/83 (BP Location: Left Arm)   Pulse 82   Temp 98.3 F (36.8 C) (Oral)   Resp 18   SpO2 98%   Visual Acuity Right Eye Distance:   Left Eye Distance:   Bilateral Distance:    Right Eye Near:   Left Eye Near:    Bilateral Near:     Physical Exam General appearance: alert, well developed, well nourished, cooperative and in no distress Head: Normocephalic, without obvious abnormality, atraumatic Respiratory: Respirations even and unlabored, normal respiratory rate Heart: rate and rhythm normal. No gallop or murmurs noted on exam  Extremities: No gross deformities Skin: Skin color, texture, turgor normal. No rashes seen  Psych: Appropriate mood and affect. Vaginal cytology self  collected. UC Treatments / Results  Labs (all labs ordered are listed, but only abnormal results are displayed) Labs Reviewed - No data to display  EKG   Radiology No results found.  Procedures Procedures (including critical care time)  Medications Ordered in UC Medications - No data to display  Initial Impression / Assessment and Plan / UC Course  I have reviewed the triage vital signs and the nursing notes.  Pertinent labs & imaging results that were available during my care of the patient were reviewed by me and considered in my medical decision making (see chart for details).   Vaginal cytology pending will cover for vaginitis with fluconazole and metronidazole gel.  Patient advised will notify her via MyChart of any abnormal labs.  Final Clinical Impressions(s) / UC Diagnoses   Final diagnoses:  Vaginitis and vulvovaginitis   Discharge Instructions   None    ED Prescriptions    Medication Sig Dispense Auth. Provider   metroNIDAZOLE (METROGEL VAGINAL) 0.75 % vaginal gel Place 1 Applicatorful vaginally 2 (two) times daily. 70 g Bing Neighbors, FNP   fluconazole (DIFLUCAN) 150 MG tablet Take 1 tablet (150 mg total) by mouth once for 1 dose. Repeat if needed 2 tablet Bing Neighbors, FNP     PDMP not reviewed this encounter.   Bing Neighbors, FNP 07/06/20 1737

## 2020-07-07 LAB — CERVICOVAGINAL ANCILLARY ONLY
Bacterial Vaginitis (gardnerella): POSITIVE — AB
Candida Glabrata: NEGATIVE
Candida Vaginitis: POSITIVE — AB
Chlamydia: NEGATIVE
Comment: NEGATIVE
Comment: NEGATIVE
Comment: NEGATIVE
Comment: NEGATIVE
Comment: NEGATIVE
Comment: NORMAL
Neisseria Gonorrhea: NEGATIVE
Trichomonas: NEGATIVE

## 2020-11-26 ENCOUNTER — Encounter (HOSPITAL_COMMUNITY): Payer: Self-pay | Admitting: *Deleted

## 2020-11-26 ENCOUNTER — Other Ambulatory Visit: Payer: Self-pay

## 2020-11-26 ENCOUNTER — Ambulatory Visit (HOSPITAL_COMMUNITY)
Admission: EM | Admit: 2020-11-26 | Discharge: 2020-11-26 | Disposition: A | Discharge status: Home / Self Care | Payer: Medicaid Other

## 2020-11-26 DIAGNOSIS — R112 Nausea with vomiting, unspecified: Secondary | ICD-10-CM

## 2020-11-26 DIAGNOSIS — R197 Diarrhea, unspecified: Secondary | ICD-10-CM

## 2020-11-26 DIAGNOSIS — A059 Bacterial foodborne intoxication, unspecified: Secondary | ICD-10-CM

## 2020-11-26 NOTE — ED Triage Notes (Signed)
Pt reports feeling just fine until eating some food last night. Pt has N/V/D

## 2020-11-26 NOTE — ED Provider Notes (Signed)
[facility]    CSN: [id number] Arrival date & time: 11/26/20  1803      History   Chief Complaint Chief Complaint  Patient presents with  . ate something last nught  . Nausea  . Emesis  . Diarrhea    HPI Emily Cain is a 25 y.o. female.   Patient here for evaluation of nausea, vomiting, and diarrhea that started last night after eating some questionable chicken.  Denies any fevers or contacts with sick individuals.  Patient reports eating and drinking minimally today.  Reports multiple episodes of vomiting and diarrhea but is able to keep water down.  Denies any significant abdominal pain.  Has not tried any OTC medications or treatments.  Denies any specific alleviating or aggravating factors.  Denies any fevers, chest pain, shortness of breath, N/V/D, numbness, tingling, weakness, abdominal pain, or headaches.   ROS: As per HPI, all other pertinent ROS negative   The history is provided by the patient.  Emesis Associated symptoms: diarrhea   Diarrhea Associated symptoms: vomiting     Past Medical History:  Diagnosis Date  . Anemia   . Environmental allergies   . History of IUFD     Patient Active Problem List   Diagnosis Date Noted  . Abnormal menses 12/07/2017  . 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 NO PAST SURGERIES      OB History    Gravida  2   Para  2   Term  2   Preterm  0   AB  0   Living  1     SAB  0   IAB  0   Ectopic  0   Multiple  0   Live Births  1            Home Medications    Prior to Admission medications   Medication Sig Start Date End Date Taking? Authorizing Provider  gabapentin (NEURONTIN) 300 MG capsule Take 300 mg by mouth 3 (three) times daily.    [provider]  medroxyPROGESTERone (PROVERA) 10 MG tablet Take 1 tablet (10 mg total) by mouth daily for 10 days. 05/31/20  06/10/20  Lorelee New, PA-C  metroNIDAZOLE (METROGEL VAGINAL) 0.75 % vaginal gel Place 1 Applicatorful vaginally 2 (two) times daily. 07/06/20   Bing Neighbors, FNP  naproxen (NAPROSYN) 500 MG tablet Take 1 tablet (500 mg total) by mouth 2 (two) times daily. 05/31/20   Lorelee New, PA-C  sertraline (ZOLOFT) 25 MG tablet Take 1 tablet (25 mg total) by mouth daily. Patient not taking: Reported on 01/21/2018 07/05/17 04/26/19  Marny Lowenstein, PA-C    Family History Family History  Problem Relation Age of Onset  . Cancer Maternal Aunt   . Cancer Maternal Uncle   . Cancer Maternal Grandmother     Social History Social History   Tobacco Use  . Smoking status: Former Smoker    Packs/day: 0.25    Types: Cigarettes  . Smokeless tobacco: Never Used  Vaping Use  . Vaping Use: Never used  Substance Use Topics  . Alcohol use: Not Currently  . Drug use: No     Allergies   Methadone   Review of Systems Review of Systems  Gastrointestinal: Positive for diarrhea, nausea and vomiting.  All other systems reviewed and are negative.    Physical Exam Triage  Vital Signs ED Triage Vitals  Enc Vitals Group     BP 11/26/20 1816 123/76     Pulse Rate 11/26/20 1816 88     Resp 11/26/20 1816 16     Temp 11/26/20 1816 99.1 F (37.3 C)     Temp Source 11/26/20 1816 Oral     SpO2 11/26/20 1816 98 %     Weight --      Height --      Head Circumference --      Peak Flow --      Pain Score 11/26/20 1814 0     Pain Loc --      Pain Edu? --      Excl. in GC? --    No data found.  Updated Vital Signs BP 123/76 (BP Location: Left Arm)   Pulse 88   Temp 99.1 F (37.3 C) (Oral)   Resp 16   SpO2 98%   Visual Acuity Right Eye Distance:   Left Eye Distance:   Bilateral Distance:    Right Eye Near:   Left Eye Near:    Bilateral Near:     Physical Exam Vitals and nursing note reviewed.  Constitutional:      General: She is not in acute distress.    Appearance:  Normal appearance. She is not ill-appearing, toxic-appearing or diaphoretic.  HENT:     Head: Normocephalic and atraumatic.  Eyes:     Conjunctiva/sclera: Conjunctivae normal.  Cardiovascular:     Rate and Rhythm: Normal rate.     Pulses: Normal pulses.  Pulmonary:     Effort: Pulmonary effort is normal.  Abdominal:     General: Abdomen is flat.     Palpations: Abdomen is soft.  Musculoskeletal:        General: Normal range of motion.     Cervical back: Normal range of motion.  Skin:    General: Skin is warm and dry.  Neurological:     General: No focal deficit present.     Mental Status: She is alert and oriented to person, place, and time.  Psychiatric:        Mood and Affect: Mood normal.      UC Treatments / Results  Labs (all labs ordered are listed, but only abnormal results are displayed) Labs Reviewed - No data to display  EKG   Radiology No results found.  Procedures Procedures (including critical care time)  Medications Ordered in UC Medications - No data to display  Initial Impression / Assessment and Plan / UC Course  I have reviewed the triage vital signs and the nursing notes.  Pertinent labs & imaging results that were available during my care of the patient were reviewed by me and considered in my medical decision making (see chart for details).     Assessment negative for red flags or concerns, most likely food poisoning.  Discussed ensuring plenty of fluid intake and rest.  May use ginger and mint for nausea.  Discussed sticking to a BRAT or bland food diet initially and advancing as tolerated.  Work note requested and supplied to patient.  Follow-up with primary care as needed.   Final Clinical Impressions(s) / UC Diagnoses   Final diagnoses:  Food poisoning  Nausea vomiting and diarrhea     Discharge Instructions     Make sure you are drinking plenty of fluids, such as water, Gatorade/Powerade/Pedialyte, teas, and juices.  You can use  ginger ale, ginger candies, and mint  for nausea relief.  Start adding foods from the BRAT (bananas, rice, applesauce, toast) or bland food diets and increase as tolerated.   Follow-up with your primary care provider or go to the Emergency Department if symptoms worsen or do not improve over the next few days.    ED Prescriptions    None     PDMP not reviewed this encounter.   Ivette Loyal, NP 11/26/20 1843

## 2020-11-26 NOTE — Discharge Instructions (Signed)
Make sure you are drinking plenty of fluids, such as water, Gatorade/Powerade/Pedialyte, teas, and juices.  You can use ginger ale, ginger candies, and mint for nausea relief.  Start adding foods from the BRAT (bananas, rice, applesauce, toast) or bland food diets and increase as tolerated.   Follow-up with your primary care provider or go to the Emergency Department if symptoms worsen or do not improve over the next few days.

## 2020-12-03 ENCOUNTER — Other Ambulatory Visit: Payer: Self-pay

## 2020-12-03 ENCOUNTER — Encounter (HOSPITAL_COMMUNITY): Payer: Self-pay | Admitting: Emergency Medicine

## 2020-12-03 ENCOUNTER — Ambulatory Visit (HOSPITAL_COMMUNITY)
Admission: EM | Admit: 2020-12-03 | Discharge: 2020-12-03 | Disposition: A | Discharge status: Home / Self Care | Payer: Medicaid Other | Attending: Emergency Medicine | Admitting: Emergency Medicine

## 2020-12-03 DIAGNOSIS — M79672 Pain in left foot: Secondary | ICD-10-CM | POA: Insufficient documentation

## 2020-12-03 DIAGNOSIS — Z113 Encounter for screening for infections with a predominantly sexual mode of transmission: Secondary | ICD-10-CM | POA: Insufficient documentation

## 2020-12-03 DIAGNOSIS — S93602A Unspecified sprain of left foot, initial encounter: Secondary | ICD-10-CM | POA: Insufficient documentation

## 2020-12-03 DIAGNOSIS — N898 Other specified noninflammatory disorders of vagina: Secondary | ICD-10-CM | POA: Insufficient documentation

## 2020-12-03 LAB — POCT URINALYSIS DIPSTICK, ED / UC
Bilirubin Urine: NEGATIVE
Glucose, UA: NEGATIVE mg/dL
Nitrite: NEGATIVE
Protein, ur: NEGATIVE mg/dL
Specific Gravity, Urine: 1.025 (ref 1.005–1.030)
Urobilinogen, UA: 1 mg/dL (ref 0.0–1.0)
pH: 6.5 (ref 5.0–8.0)

## 2020-12-03 MED ORDER — NAPROXEN 500 MG PO TABS: 500.0000 mg | ORAL_TABLET | Freq: Two times a day (BID) | ORAL | 0 refills | Status: DC

## 2020-12-03 NOTE — Discharge Instructions (Signed)
Take the naproxen twice a day for the next 5-7 days. You can stop taking it sooner if your pain improves.   RICE: Rest as much as possible Ice for 10-15 minutes every 4-6 hours as needed for pain and swelling Compression- use an ace bandage or splint for comfort Elevate above your hip when sitting and standing  Follow up with sports medicine or orthopedics if symptoms do not improve in the next 10-14 days.   We will contact you with the results from your lab work and any additional treatment.    Do not have sex while taking undergoing treatment for STI.  Make sure that all of your partners get tested and treated.   Use a condom or other barrier method for all sexual encounters.    Return or go to the Emergency Department if symptoms worsen or do not improve in the next few days.

## 2020-12-03 NOTE — ED Provider Notes (Addendum)
[facility]    CSN: [id number] Arrival date & time: 12/03/20  1805      History   Chief Complaint Chief Complaint  Patient presents with  . Foot Pain    left    HPI Emily Cain is a 25 y.o. female.   Patient here for evaluation of left foot pain that has been ongoing for the past several days.  No significant redness or swelling noted.  Reports taking ibuprofen with minimal relief.  Reports pain worse with walking and movement.  Denies any decreased range of motion or weakness.  Denies any trauma, injury, or other precipitating event. Denies any fevers, chest pain, shortness of breath, N/V/D, numbness, tingling, weakness, abdominal pain, or headaches.   Also reports having some vaginal discharge.  Denies any abdominal pain, pelvic pain, or cramping.  Denies any dysuria, frequency, or urgency.  Patient requesting STD check. ROS: As per HPI, all other pertinent ROS negative   The history is provided by the patient.  Foot Pain    Past Medical History:  Diagnosis Date  . Anemia   . Environmental allergies   . History of IUFD     Patient Active Problem List   Diagnosis Date Noted  . Abnormal menses 12/07/2017  . 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 NO PAST SURGERIES      OB History    Gravida  2   Para  2   Term  2   Preterm  0   AB  0   Living  1     SAB  0   IAB  0   Ectopic  0   Multiple  0   Live Births  1            Home Medications    Prior to Admission medications   Medication Sig Start Date End Date Taking? Authorizing Provider  gabapentin (NEURONTIN) 300 MG capsule Take 300 mg by mouth 3 (three) times daily.   Yes [provider]  naproxen (NAPROSYN) 500 MG tablet Take 1 tablet (500 mg total) by mouth 2 (two) times daily. 12/03/20  Yes Ivette Loyal, NP  medroxyPROGESTERone (PROVERA) 10 MG  tablet Take 1 tablet (10 mg total) by mouth daily for 10 days. 05/31/20 06/10/20  Lorelee New, PA-C  metroNIDAZOLE (METROGEL VAGINAL) 0.75 % vaginal gel Place 1 Applicatorful vaginally 2 (two) times daily. 07/06/20   Bing Neighbors, FNP  sertraline (ZOLOFT) 25 MG tablet Take 1 tablet (25 mg total) by mouth daily. Patient not taking: Reported on 01/21/2018 07/05/17 04/26/19  Marny Lowenstein, PA-C    Family History Family History  Problem Relation Age of Onset  . Cancer Maternal Aunt   . Cancer Maternal Uncle   . Cancer Maternal Grandmother     Social History Social History   Tobacco Use  . Smoking status: Former Smoker    Packs/day: 0.25    Types: Cigarettes  . Smokeless tobacco: Never Used  Vaping Use  . Vaping Use: Never used  Substance Use Topics  . Alcohol use: Not Currently  . Drug use: No     Allergies   Methadone   Review of Systems Review of Systems  Genitourinary: Positive for vaginal discharge.  Musculoskeletal: Positive for arthralgias.  All other systems reviewed and are negative.    Physical Exam Triage Vital Signs ED  Triage Vitals  Enc Vitals Group     BP 12/03/20 1907 98/62     Pulse Rate 12/03/20 1907 83     Resp 12/03/20 1907 18     Temp 12/03/20 1907 97.9 F (36.6 C)     Temp Source 12/03/20 1907 Oral     SpO2 12/03/20 1907 96 %     Weight --      Height --      Head Circumference --      Peak Flow --      Pain Score 12/03/20 1904 8     Pain Loc --      Pain Edu? --      Excl. in GC? --    No data found.  Updated Vital Signs BP 98/62 (BP Location: Left Arm)   Pulse 83   Temp 97.9 F (36.6 C) (Oral)   Resp 18   LMP 11/25/2020   SpO2 96%   Visual Acuity Right Eye Distance:   Left Eye Distance:   Bilateral Distance:    Right Eye Near:   Left Eye Near:    Bilateral Near:     Physical Exam Vitals and nursing note reviewed.  Constitutional:      General: She is not in acute distress.    Appearance: Normal  appearance. She is not ill-appearing, toxic-appearing or diaphoretic.  HENT:     Head: Normocephalic and atraumatic.  Eyes:     Conjunctiva/sclera: Conjunctivae normal.  Cardiovascular:     Rate and Rhythm: Normal rate.     Pulses: Normal pulses.  Pulmonary:     Effort: Pulmonary effort is normal.  Abdominal:     General: Abdomen is flat.  Genitourinary:    Comments: declines Musculoskeletal:        General: Normal range of motion.     Cervical back: Normal range of motion.     Left foot: Normal range of motion. Tenderness present. No swelling, deformity, bony tenderness or crepitus.  Skin:    General: Skin is warm and dry.  Neurological:     General: No focal deficit present.     Mental Status: She is alert and oriented to person, place, and time.  Psychiatric:        Mood and Affect: Mood normal.      UC Treatments / Results  Labs (all labs ordered are listed, but only abnormal results are displayed) Labs Reviewed  POCT URINALYSIS DIPSTICK, ED / UC - Abnormal; Notable for the following components:      Result Value   Ketones, ur TRACE (*)    Hgb urine dipstick TRACE (*)    Leukocytes,Ua TRACE (*)    All other components within normal limits  URINE CULTURE  CERVICOVAGINAL ANCILLARY ONLY    EKG   Radiology No results found.  Procedures Procedures (including critical care time)  Medications Ordered in UC Medications - No data to display  Initial Impression / Assessment and Plan / UC Course  I have reviewed the triage vital signs and the nursing notes.  Pertinent labs & imaging results that were available during my care of the patient were reviewed by me and considered in my medical decision making (see chart for details).     Assessment negative for any red flags or concerns.  Left foot fracture test negative.  We will try conservative management with naproxen twice daily for pain management, rest, and ice.  Ace bandage applied in office by provider.  If  symptoms  are not improving in the next 10 to 14 days recommend following up with Ortho or sports medicine. Self swab obtained and will treat based on results.  Safe sex practices including condom or other barrier method use discussed.  Follow-up with primary care as needed.  Patient also requesting urine screen for UTI.  Urinalysis positive for leukocytes, hemoglobin, and ketones.  Possible contaminated specimen.  Will obtain urine culture and treat based on results.    Final Clinical Impressions(s) / UC Diagnoses   Final diagnoses:  Sprain of left foot, initial encounter  Foot pain, left  Screen for STD (sexually transmitted disease)  Vaginal discharge     Discharge Instructions     Take the naproxen twice a day for the next 5-7 days. You can stop taking it sooner if your pain improves.   RICE: Rest as much as possible Ice for 10-15 minutes every 4-6 hours as needed for pain and swelling Compression- use an ace bandage or splint for comfort Elevate above your hip when sitting and standing  Follow up with sports medicine or orthopedics if symptoms do not improve in the next 10-14 days.   We will contact you with the results from your lab work and any additional treatment.    Do not have sex while taking undergoing treatment for STI.  Make sure that all of your partners get tested and treated.   Use a condom or other barrier method for all sexual encounters.    Return or go to the Emergency Department if symptoms worsen or do not improve in the next few days.      ED Prescriptions    Medication Sig Dispense Auth. Provider   naproxen (NAPROSYN) 500 MG tablet Take 1 tablet (500 mg total) by mouth 2 (two) times daily. 30 tablet Ivette Loyal, NP     PDMP not reviewed this encounter.   Ivette Loyal, NP 12/03/20 1936    Ivette Loyal, NP 12/03/20 [phone]

## 2020-12-03 NOTE — ED Triage Notes (Signed)
Pt presents today with c/o of left foot pain. Denies injury. No swelling noted.

## 2020-12-04 ENCOUNTER — Telehealth (HOSPITAL_COMMUNITY): Payer: Self-pay | Admitting: Emergency Medicine

## 2020-12-04 LAB — CERVICOVAGINAL ANCILLARY ONLY
Bacterial Vaginitis (gardnerella): POSITIVE — AB
Candida Glabrata: NEGATIVE
Candida Vaginitis: NEGATIVE
Chlamydia: POSITIVE — AB
Comment: NEGATIVE
Comment: NEGATIVE
Comment: NEGATIVE
Comment: NEGATIVE
Comment: NEGATIVE
Comment: NORMAL
Neisseria Gonorrhea: NEGATIVE
Trichomonas: POSITIVE — AB

## 2020-12-04 MED ORDER — TINIDAZOLE 500 MG PO TABS: 2.0000 g | ORAL_TABLET | Freq: Once | ORAL | 0 refills | Status: AC

## 2020-12-04 MED ORDER — DOXYCYCLINE HYCLATE 100 MG PO CAPS: 100.0000 mg | ORAL_CAPSULE | Freq: Two times a day (BID) | ORAL | 0 refills | Status: DC

## 2020-12-04 MED ORDER — CLINDAMYCIN PHOSPHATE 100 MG VA SUPP: 100.0000 mg | Freq: Every day | VAGINAL | 0 refills | Status: AC

## 2020-12-10 ENCOUNTER — Telehealth (HOSPITAL_COMMUNITY): Payer: Self-pay | Admitting: Emergency Medicine

## 2020-12-10 MED ORDER — ONDANSETRON 4 MG PO TBDP
4.0000 mg | ORAL_TABLET | Freq: Three times a day (TID) | ORAL | 0 refills | Status: DC | PRN
Start: 2020-12-10 — End: 2022-08-29

## 2020-12-10 MED ORDER — METRONIDAZOLE 500 MG PO TABS: 500.0000 mg | ORAL_TABLET | Freq: Two times a day (BID) | ORAL | 0 refills | Status: DC

## 2020-12-10 MED ORDER — DOXYCYCLINE HYCLATE 100 MG PO CAPS: 100.0000 mg | ORAL_CAPSULE | Freq: Two times a day (BID) | ORAL | 0 refills | Status: AC

## 2020-12-10 NOTE — Telephone Encounter (Signed)
Patient states she cannot afford the medications prescribed, has not been able to pick up doxycycline of Tinidazole.  Able to find doxycycline, flagyl, and zofran (patient states she has nausea/vomiting with flagyl) for approx $20 through [facility] using [facility].  Reviewed with Dr. Tracie Harrier who okay'd and set appropriate prescriptions.  Patient updated.  Also informed patient she could make an appointment with the health department for free treatment if this doesn't work out for her.    Patient verbalized understanding

## 2020-12-18 ENCOUNTER — Encounter (HOSPITAL_COMMUNITY): Payer: Self-pay

## 2020-12-18 ENCOUNTER — Ambulatory Visit (HOSPITAL_COMMUNITY)
Admission: EM | Admit: 2020-12-18 | Discharge: 2020-12-18 | Disposition: A | Discharge status: Home / Self Care | Payer: Medicaid Other | Attending: Family | Admitting: Family

## 2020-12-18 DIAGNOSIS — B373 Candidiasis of vulva and vagina: Secondary | ICD-10-CM | POA: Diagnosis not present

## 2020-12-18 DIAGNOSIS — A599 Trichomoniasis, unspecified: Secondary | ICD-10-CM | POA: Diagnosis not present

## 2020-12-18 DIAGNOSIS — B3731 Acute candidiasis of vulva and vagina: Secondary | ICD-10-CM

## 2020-12-18 DIAGNOSIS — N760 Acute vaginitis: Secondary | ICD-10-CM | POA: Diagnosis not present

## 2020-12-18 DIAGNOSIS — B9689 Other specified bacterial agents as the cause of diseases classified elsewhere: Secondary | ICD-10-CM | POA: Diagnosis not present

## 2020-12-18 LAB — POCT URINALYSIS DIPSTICK, ED / UC
Bilirubin Urine: NEGATIVE
Bilirubin Urine: NEGATIVE
Glucose, UA: NEGATIVE mg/dL
Glucose, UA: NEGATIVE mg/dL
Hgb urine dipstick: NEGATIVE
Hgb urine dipstick: NEGATIVE
Ketones, ur: NEGATIVE mg/dL
Leukocytes,Ua: NEGATIVE
Leukocytes,Ua: NEGATIVE
Nitrite: NEGATIVE
Nitrite: NEGATIVE
Protein, ur: NEGATIVE mg/dL
Protein, ur: NEGATIVE mg/dL
Specific Gravity, Urine: >=1.03 (ref 1.005–1.030)
Specific Gravity, Urine: >=1.03 (ref 1.005–1.030)
Urobilinogen, UA: 0.2 mg/dL (ref 0.0–1.0)
Urobilinogen, UA: 0.2 mg/dL (ref 0.0–1.0)
pH: 5.5 (ref 5.0–8.0)
pH: 5.5 (ref 5.0–8.0)

## 2020-12-18 LAB — POC URINE PREG, ED: Preg Test, Ur: NEGATIVE

## 2020-12-18 MED ORDER — TINIDAZOLE 500 MG PO TABS: 2.0000 g | ORAL_TABLET | Freq: Every day | ORAL | 0 refills | Status: AC

## 2020-12-18 MED ORDER — FLUCONAZOLE 150 MG PO TABS: 150.0000 mg | ORAL_TABLET | Freq: Every day | ORAL | 0 refills | Status: AC

## 2020-12-18 NOTE — ED Triage Notes (Signed)
Pt presents with vaginal itching and states she might have had a reaction to Flagyl. Pt states she has been having abdominal pain. Pt states it is a cramping pain.

## 2020-12-18 NOTE — ED Provider Notes (Addendum)
[facility]    CSN: [id number] Arrival date & time: 12/18/20  1407      History   Chief Complaint Chief Complaint  Patient presents with  . Abdominal Pain  . Vaginal Itching  . Medication Reaction    HPI Emily Cain is a 25 y.o. female.   Patient presents with vaginal itching , Emily Cain thick discharge, flank pain and abdominal pain for 1 week. Seen on 4/27 , positive for chyalmadia, BV and trichomoniasis. Finished course of doxycyline. Unable to tolerate Flagyl, threw up every pill, attempted use of nausea medication with no success. Denies fever, chills, urinary frequency, urgency. Has not has sex since positive diagnosis  Past Medical History:  Diagnosis Date  . Anemia   . Environmental allergies   . History of IUFD     Patient Active Problem List   Diagnosis Date Noted  . Abnormal menses 12/07/2017  . 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 NO PAST SURGERIES      OB History    Gravida  2   Para  2   Term  2   Preterm  0   AB  0   Living  1     SAB  0   IAB  0   Ectopic  0   Multiple  0   Live Births  1            Home Medications    Prior to Admission medications   Medication Sig Start Date End Date Taking? Authorizing Provider  fluconazole (DIFLUCAN) 150 MG tablet Take 1 tablet (150 mg total) by mouth daily for 2 days. 12/18/20 12/20/20 Yes Takasha Vetere R, NP  tinidazole (TINDAMAX) 500 MG tablet Take 4 tablets (2,000 mg total) by mouth daily with breakfast for 2 days. 12/18/20 12/20/20 Yes Temiloluwa Recchia R, NP  gabapentin (NEURONTIN) 300 MG capsule Take 300 mg by mouth 3 (three) times daily.    [provider]  medroxyPROGESTERone (PROVERA) 10 MG tablet Take 1 tablet (10 mg total) by mouth daily for 10 days. 05/31/20 06/10/20  Lorelee New, PA-C  metroNIDAZOLE (FLAGYL) 500 MG tablet Take 1 tablet (500  mg total) by mouth 2 (two) times daily. 12/10/20   Mardella Layman, MD  naproxen (NAPROSYN) 500 MG tablet Take 1 tablet (500 mg total) by mouth 2 (two) times daily. 12/03/20   Ivette Loyal, NP  ondansetron (ZOFRAN ODT) 4 MG disintegrating tablet Take 1 tablet (4 mg total) by mouth every 8 (eight) hours as needed for nausea or vomiting. 12/10/20   Mardella Layman, MD  sertraline (ZOLOFT) 25 MG tablet Take 1 tablet (25 mg total) by mouth daily. Patient not taking: Reported on 01/21/2018 07/05/17 04/26/19  Marny Lowenstein, PA-C    Family History Family History  Problem Relation Age of Onset  . Cancer Maternal Aunt   . Cancer Maternal Uncle   . Cancer Maternal Grandmother     Social History Social History   Tobacco Use  . Smoking status: Former Smoker    Packs/day: 0.25    Types: Cigarettes  . Smokeless tobacco: Never Used  Vaping Use  . Vaping Use: Never used  Substance Use Topics  . Alcohol use: Not Currently  . Drug use: No     Allergies   Methadone   Review of Systems Review of Systems  Constitutional: Negative.  Respiratory: Negative.   Cardiovascular: Negative.   Gastrointestinal: Positive for abdominal pain. Negative for abdominal distention, anal bleeding, blood in stool, constipation, diarrhea, nausea, rectal pain and vomiting.  Genitourinary: Positive for flank pain and vaginal discharge. Negative for decreased urine volume, difficulty urinating, dyspareunia, dysuria, enuresis, frequency, genital sores, hematuria, menstrual problem, pelvic pain, urgency, vaginal bleeding and vaginal pain.  Skin: Negative.   Neurological: Negative.      Physical Exam Triage Vital Signs ED Triage Vitals  Enc Vitals Group     BP 12/18/20 1618 119/76     Pulse Rate 12/18/20 1618 89     Resp 12/18/20 1618 18     Temp 12/18/20 1618 98.1 F (36.7 C)     Temp Source 12/18/20 1618 Oral     SpO2 12/18/20 1618 100 %     Weight --      Height --      Head Circumference --      Peak  Flow --      Pain Score 12/18/20 1616 4     Pain Loc --      Pain Edu? --      Excl. in GC? --    No data found.  Updated Vital Signs BP 119/76 (BP Location: Right Arm)   Pulse 89   Temp 98.1 F (36.7 C) (Oral)   Resp 18   LMP 11/25/2020   SpO2 100%   Visual Acuity Right Eye Distance:   Left Eye Distance:   Bilateral Distance:    Right Eye Near:   Left Eye Near:    Bilateral Near:     Physical Exam Constitutional:      Appearance: She is well-developed and normal weight.  HENT:     Head: Normocephalic.  Eyes:     Extraocular Movements: Extraocular movements intact.  Pulmonary:     Effort: Pulmonary effort is normal.  Abdominal:     General: Abdomen is flat. Bowel sounds are normal.     Palpations: Abdomen is soft.     Tenderness: There is abdominal tenderness in the right upper quadrant and left upper quadrant. There is right CVA tenderness and left CVA tenderness. There is no guarding or rebound.  Skin:    General: Skin is warm and dry.  Neurological:     General: No focal deficit present.     Mental Status: She is alert and oriented to person, place, and time.  Psychiatric:        Mood and Affect: Mood normal.        Behavior: Behavior normal.      UC Treatments / Results  Labs (all labs ordered are listed, but only abnormal results are displayed) Labs Reviewed  POCT URINALYSIS DIPSTICK, ED / UC - Abnormal; Notable for the following components:      Result Value   Ketones, ur TRACE (*)    All other components within normal limits  POCT URINALYSIS DIPSTICK, ED / UC  POC URINE PREG, ED    EKG   Radiology No results found.  Procedures Procedures (including critical care time)  Medications Ordered in UC Medications - No data to display  Initial Impression / Assessment and Plan / UC Course  I have reviewed the triage vital signs and the nursing notes.  Pertinent labs & imaging results that were available during my care of the patient were  reviewed by me and considered in my medical decision making (see chart for details).  Bacterial vaginosis Trichomoniasis Candida  Vaginitis   1. Tinizadole 2 g daily for 2 days 2. Diflucan 150 mg once then in 3 days prn 3. sti screening pending, will treat per protocol, advised abstinence until all treatment complete  4. Urinalysis negative 5. Urine pregnancy negative  Final Clinial Impressions(s) / UC Diagnoses   Final diagnoses:  Bacterial vaginosis  Trichomonosis  Candida vaginitis     Discharge Instructions     Take 4 pills today and then 4 pills tomorrow  Take diflucan today and then the second pill in 3 days if symptoms still present    ED Prescriptions    Medication Sig Dispense Auth. Provider   tinidazole (TINDAMAX) 500 MG tablet Take 4 tablets (2,000 mg total) by mouth daily with breakfast for 2 days. 8 tablet Muhsin Doris R, NP   fluconazole (DIFLUCAN) 150 MG tablet Take 1 tablet (150 mg total) by mouth daily for 2 days. 2 tablet Valinda Hoar, NP     PDMP not reviewed this encounter.   Valinda Hoar, NP 12/18/20 1654    Valinda Hoar, NP 12/18/20 1655

## 2020-12-18 NOTE — Discharge Instructions (Addendum)
Take 4 pills today and then 4 pills tomorrow  Take diflucan today and then the second pill in 3 days if symptoms still present

## 2020-12-19 LAB — CERVICOVAGINAL ANCILLARY ONLY
Bacterial Vaginitis (gardnerella): POSITIVE — AB
Candida Glabrata: NEGATIVE
Candida Vaginitis: NEGATIVE
Chlamydia: NEGATIVE
Comment: NEGATIVE
Comment: NEGATIVE
Comment: NEGATIVE
Comment: NEGATIVE
Comment: NEGATIVE
Comment: NORMAL
Neisseria Gonorrhea: NEGATIVE
Trichomonas: NEGATIVE

## 2020-12-23 ENCOUNTER — Other Ambulatory Visit: Payer: Self-pay

## 2020-12-23 ENCOUNTER — Emergency Department (HOSPITAL_BASED_OUTPATIENT_CLINIC_OR_DEPARTMENT_OTHER)
Admission: EM | Admit: 2020-12-23 | Discharge: 2020-12-23 | Disposition: A | Discharge status: Home / Self Care | Payer: Medicaid Other | Attending: Emergency Medicine | Admitting: Emergency Medicine

## 2020-12-23 ENCOUNTER — Encounter (HOSPITAL_BASED_OUTPATIENT_CLINIC_OR_DEPARTMENT_OTHER): Payer: Self-pay | Admitting: *Deleted

## 2020-12-23 DIAGNOSIS — R112 Nausea with vomiting, unspecified: Secondary | ICD-10-CM | POA: Diagnosis present

## 2020-12-23 DIAGNOSIS — F10120 Alcohol abuse with intoxication, uncomplicated: Secondary | ICD-10-CM

## 2020-12-23 DIAGNOSIS — F10129 Alcohol abuse with intoxication, unspecified: Secondary | ICD-10-CM | POA: Insufficient documentation

## 2020-12-23 DIAGNOSIS — Z87891 Personal history of nicotine dependence: Secondary | ICD-10-CM | POA: Insufficient documentation

## 2020-12-23 LAB — URINALYSIS, ROUTINE W REFLEX MICROSCOPIC
Bilirubin Urine: NEGATIVE
Glucose, UA: NEGATIVE mg/dL
Ketones, ur: NEGATIVE mg/dL
Leukocytes,Ua: NEGATIVE
Nitrite: NEGATIVE
Protein, ur: NEGATIVE mg/dL
Specific Gravity, Urine: 1.02 (ref 1.005–1.030)
pH: 8 (ref 5.0–8.0)

## 2020-12-23 LAB — COMPREHENSIVE METABOLIC PANEL
ALT: 14 U/L (ref 0–44)
AST: 16 U/L (ref 15–41)
Albumin: 4 g/dL (ref 3.5–5.0)
Alkaline Phosphatase: 51 U/L (ref 38–126)
Anion gap: 7 (ref 5–15)
BUN: 11 mg/dL (ref 6–20)
CO2: 24 mmol/L (ref 22–32)
Calcium: 8.9 mg/dL (ref 8.9–10.3)
Chloride: 105 mmol/L (ref 98–111)
Creatinine, Ser: 0.82 mg/dL (ref 0.44–1.00)
GFR, Estimated: >60 mL/min (ref >60)
Glucose, Bld: 98 mg/dL (ref 70–99)
Potassium: 3.3 mmol/L — ABNORMAL LOW (ref 3.5–5.1)
Sodium: 136 mmol/L (ref 135–145)
Total Bilirubin: 0.2 mg/dL — ABNORMAL LOW (ref 0.3–1.2)
Total Protein: 7.3 g/dL (ref 6.5–8.1)

## 2020-12-23 LAB — CBC
HCT: 37.6 % (ref 36.0–46.0)
Hemoglobin: 12.2 g/dL (ref 12.0–15.0)
MCH: 27.5 pg (ref 26.0–34.0)
MCHC: 32.4 g/dL (ref 30.0–36.0)
MCV: 84.7 fL (ref 80.0–100.0)
Platelets: 218 10*3/uL (ref 150–400)
RBC: 4.44 MIL/uL (ref 3.87–5.11)
RDW: 13.7 % (ref 11.5–15.5)
WBC: 5.9 10*3/uL (ref 4.0–10.5)
nRBC: 0 % (ref 0.0–0.2)

## 2020-12-23 LAB — URINALYSIS, MICROSCOPIC (REFLEX): WBC, UA: NONE SEEN WBC/hpf (ref 0–5)

## 2020-12-23 LAB — PREGNANCY, URINE: Preg Test, Ur: NEGATIVE

## 2020-12-23 LAB — LIPASE, BLOOD: Lipase: 41 U/L (ref 11–51)

## 2020-12-23 MED ORDER — FAMOTIDINE 20 MG PO TABS
20.0000 mg | ORAL_TABLET | Freq: Once | ORAL | Status: AC
  Administered 2020-12-23: 20 mg via ORAL
  Filled 2020-12-23: qty 1

## 2020-12-23 MED ORDER — SODIUM CHLORIDE 0.9 % IV BOLUS
1000.0000 mL | Freq: Once | INTRAVENOUS | Status: AC
  Administered 2020-12-23: 1000 mL via INTRAVENOUS

## 2020-12-23 MED ORDER — ONDANSETRON 4 MG PO TBDP
8.0000 mg | ORAL_TABLET | Freq: Once | ORAL | Status: AC
  Administered 2020-12-23: 8 mg via ORAL
  Filled 2020-12-23: qty 2

## 2020-12-23 MED ORDER — ONDANSETRON HCL 4 MG PO TABS: 4.0000 mg | ORAL_TABLET | Freq: Four times a day (QID) | ORAL | 0 refills | Status: DC

## 2020-12-23 MED ORDER — DICYCLOMINE HCL 10 MG/ML IM SOLN
20.0000 mg | Freq: Once | INTRAMUSCULAR | Status: AC
  Administered 2020-12-23: 20 mg via INTRAMUSCULAR
  Filled 2020-12-23: qty 2

## 2020-12-23 NOTE — Discharge Instructions (Signed)
You were seen today for abdominal pain, nausea and diarrhea. Please keep drinking fluids. I've sent zofran to your pharmacy. You can dissolve the tablet under your tongue every 8 hours as needed for nausea.

## 2020-12-23 NOTE — ED Provider Notes (Signed)
[facility] EMERGENCY DEPARTMENT Provider Note   CSN: [id number] Arrival date & time: 12/23/20  1935     History No chief complaint on file.   Emily Cain is a 25 y.o. female.  HPI   Patient presents with cramping abdominal pain, nausea and vomiting x 12 hours. States she was drinking until 3 am in the morning. Reports taking about 9 shots but does not remember if she had anything after. She denies SOB, chest pain, fevers, chills. Reports associated diarrhea.   Past Medical History:  Diagnosis Date  . Anemia   . Environmental allergies   . History of IUFD     Patient Active Problem List   Diagnosis Date Noted  . Abnormal menses 12/07/2017  . 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 NO PAST SURGERIES       OB History    Gravida  2   Para  2   Term  2   Preterm  0   AB  0   Living  1     SAB  0   IAB  0   Ectopic  0   Multiple  0   Live Births  1           Family History  Problem Relation Age of Onset  . Cancer Maternal Aunt   . Cancer Maternal Uncle   . Cancer Maternal Grandmother     Social History   Tobacco Use  . Smoking status: Former Smoker    Packs/day: 0.25    Types: Cigarettes  . Smokeless tobacco: Never Used  Vaping Use  . Vaping Use: Every day  Substance Use Topics  . Alcohol use: Yes  . Drug use: No    Home Medications Prior to Admission medications   Medication Sig Start Date End Date Taking? Authorizing Provider  gabapentin (NEURONTIN) 300 MG capsule Take 300 mg by mouth 3 (three) times daily.   Yes [provider]  ondansetron (ZOFRAN) 4 MG tablet Take 1 tablet (4 mg total) by mouth every 6 (six) hours. 12/23/20  Yes Theron Arista, PA-C  medroxyPROGESTERone (PROVERA) 10 MG tablet Take 1 tablet (10 mg total) by mouth daily for 10 days. 05/31/20 06/10/20  Lorelee New, PA-C   metroNIDAZOLE (FLAGYL) 500 MG tablet Take 1 tablet (500 mg total) by mouth 2 (two) times daily. 12/10/20   Mardella Layman, MD  naproxen (NAPROSYN) 500 MG tablet Take 1 tablet (500 mg total) by mouth 2 (two) times daily. 12/03/20   Ivette Loyal, NP  ondansetron (ZOFRAN ODT) 4 MG disintegrating tablet Take 1 tablet (4 mg total) by mouth every 8 (eight) hours as needed for nausea or vomiting. 12/10/20   Mardella Layman, MD  sertraline (ZOLOFT) 25 MG tablet Take 1 tablet (25 mg total) by mouth daily. Patient not taking: Reported on 01/21/2018 07/05/17 04/26/19  Marny Lowenstein, PA-C    Allergies    Methadone  Review of Systems   Review of Systems  Constitutional: Negative for chills and fever.  HENT: Negative for ear pain and sore throat.   Eyes: Negative for pain and visual disturbance.  Respiratory: Negative for cough and shortness of breath.   Cardiovascular: Negative for chest pain and palpitations.  Gastrointestinal: Positive for abdominal pain, diarrhea, nausea and vomiting.  Genitourinary: Negative for dysuria and hematuria.  Musculoskeletal: Negative for arthralgias and back  pain.  Skin: Negative for color change and rash.  Neurological: Negative for seizures and syncope.  All other systems reviewed and are negative.   Physical Exam Updated Vital Signs BP 125/89 (BP Location: Left Arm)   Pulse 67   Temp 98.5 F (36.9 C) (Oral)   Resp 18   Ht 5' 7.5" (1.715 m)   Wt 88.9 kg   LMP 11/25/2020   SpO2 100%   BMI 30.24 kg/m   Physical Exam Vitals and nursing note reviewed. Exam conducted with a chaperone present.  Constitutional:      Appearance: Normal appearance.  HENT:     Head: Normocephalic and atraumatic.  Eyes:     General: No scleral icterus.       Right eye: No discharge.        Left eye: No discharge.     Extraocular Movements: Extraocular movements intact.     Pupils: Pupils are equal, round, and reactive to light.  Cardiovascular:     Rate and Rhythm: Normal  rate and regular rhythm.     Pulses: Normal pulses.     Heart sounds: Normal heart sounds. No murmur heard. No friction rub. No gallop.      Comments: No TTP. Cramping pain comes and goes but isn't associated with pressure. Pulmonary:     Effort: Pulmonary effort is normal. No respiratory distress.     Breath sounds: Normal breath sounds.  Abdominal:     General: Abdomen is flat. Bowel sounds are normal. There is no distension.     Palpations: Abdomen is soft.     Tenderness: There is no abdominal tenderness.  Skin:    General: Skin is warm and dry.     Coloration: Skin is not jaundiced.  Neurological:     Mental Status: She is alert. Mental status is at baseline.     Coordination: Coordination normal.     ED Results / Procedures / Treatments   Labs (all labs ordered are listed, but only abnormal results are displayed) Labs Reviewed  COMPREHENSIVE METABOLIC PANEL - Abnormal; Notable for the following components:      Result Value   Potassium 3.3 (*)    Total Bilirubin 0.2 (*)    All other components within normal limits  URINALYSIS, ROUTINE W REFLEX MICROSCOPIC - Abnormal; Notable for the following components:   Hgb urine dipstick MODERATE (*)    All other components within normal limits  URINALYSIS, MICROSCOPIC (REFLEX) - Abnormal; Notable for the following components:   Bacteria, UA RARE (*)    All other components within normal limits  LIPASE, BLOOD  CBC  PREGNANCY, URINE    EKG None  Radiology No results found.  Procedures Procedures   Medications Ordered in ED Medications  dicyclomine (BENTYL) injection 20 mg (20 mg Intramuscular Given 12/23/20 2023)  ondansetron (ZOFRAN-ODT) disintegrating tablet 8 mg (8 mg Oral Given 12/23/20 2023)  sodium chloride 0.9 % bolus 1,000 mL (0 mLs Intravenous Stopped 12/23/20 2130)  famotidine (PEPCID) tablet 20 mg (20 mg Oral Given 12/23/20 2117)    ED Course  I have reviewed the triage vital signs and the nursing  notes.  Pertinent labs & imaging results that were available during my care of the patient were reviewed by me and considered in my medical decision making (see chart for details).    MDM Rules/Calculators/A&P  Patient is nontoxic, stable vitals. High suspicion for hangover. Patient given IV fluids, zofran and bentyl.  9:03 PM - patient reports the nausea is better. She is still having cramping abdominal pain. Ordered her pepcid. Labs are reassuring. Lipase normal, no signs of pancreatitis. No electrolyte derrangement. No elevated WBC suggesting infection or anemia. No UTI. Patient is about to start her period and denies any dysuria.   Patient feeling better. Discharged with prescription for zofran for nausea and vomiting. Advised drinking fluid. Patient agreeable to being discharged home.  Final Clinical Impression(s) / ED Diagnoses Final diagnoses:  Hangover without complication [facility])    Rx / DC Orders ED Discharge Orders         Ordered    ondansetron (ZOFRAN) 4 MG tablet  Every 6 hours        12/23/20 2228           Theron Arista, PA-C 12/23/20 2230    Gwyneth Sprout, MD 12/31/20 [id number]

## 2020-12-23 NOTE — ED Notes (Signed)
Pt states feels like she has alcohol poinsoning after heavy drinking yesterday, nausea.  Abdominal cramping, diarrhea that started this morning.

## 2020-12-23 NOTE — ED Triage Notes (Signed)
Abdominal cramps, diarrhea, nausea. States symptoms started after drinking an excessive amount of alcohol last night.

## 2021-03-07 ENCOUNTER — Encounter (HOSPITAL_COMMUNITY): Payer: Self-pay | Admitting: Emergency Medicine

## 2021-03-07 ENCOUNTER — Emergency Department (HOSPITAL_COMMUNITY)
Admission: EM | Admit: 2021-03-07 | Discharge: 2021-03-07 | Disposition: A | Discharge status: Home / Self Care | Payer: Medicaid Other | Attending: Emergency Medicine | Admitting: Emergency Medicine

## 2021-03-07 ENCOUNTER — Other Ambulatory Visit: Payer: Self-pay

## 2021-03-07 DIAGNOSIS — Z87891 Personal history of nicotine dependence: Secondary | ICD-10-CM | POA: Diagnosis not present

## 2021-03-07 DIAGNOSIS — R1030 Lower abdominal pain, unspecified: Secondary | ICD-10-CM | POA: Diagnosis present

## 2021-03-07 DIAGNOSIS — B9689 Other specified bacterial agents as the cause of diseases classified elsewhere: Secondary | ICD-10-CM | POA: Diagnosis not present

## 2021-03-07 DIAGNOSIS — N3000 Acute cystitis without hematuria: Secondary | ICD-10-CM | POA: Insufficient documentation

## 2021-03-07 DIAGNOSIS — N898 Other specified noninflammatory disorders of vagina: Secondary | ICD-10-CM | POA: Diagnosis not present

## 2021-03-07 LAB — CBC WITH DIFFERENTIAL/PLATELET
Abs Immature Granulocytes: 0.01 10*3/uL (ref 0.00–0.07)
Basophils Absolute: 0 10*3/uL (ref 0.0–0.1)
Basophils Relative: 1 %
Eosinophils Absolute: 0.3 10*3/uL (ref 0.0–0.5)
Eosinophils Relative: 6 %
HCT: 39.2 % (ref 36.0–46.0)
Hemoglobin: 12.9 g/dL (ref 12.0–15.0)
Immature Granulocytes: 0 %
Lymphocytes Relative: 49 %
Lymphs Abs: 2.7 10*3/uL (ref 0.7–4.0)
MCH: 27.6 pg (ref 26.0–34.0)
MCHC: 32.9 g/dL (ref 30.0–36.0)
MCV: 83.9 fL (ref 80.0–100.0)
Monocytes Absolute: 0.3 10*3/uL (ref 0.1–1.0)
Monocytes Relative: 5 %
Neutro Abs: 2.1 10*3/uL (ref 1.7–7.7)
Neutrophils Relative %: 39 %
Platelets: 177 10*3/uL (ref 150–400)
RBC: 4.67 MIL/uL (ref 3.87–5.11)
RDW: 13.9 % (ref 11.5–15.5)
WBC: 5.5 10*3/uL (ref 4.0–10.5)
nRBC: 0 % (ref 0.0–0.2)

## 2021-03-07 LAB — COMPREHENSIVE METABOLIC PANEL
ALT: 12 U/L (ref 0–44)
AST: 16 U/L (ref 15–41)
Albumin: 3.2 g/dL — ABNORMAL LOW (ref 3.5–5.0)
Alkaline Phosphatase: 40 U/L (ref 38–126)
Anion gap: 5 (ref 5–15)
BUN: 5 mg/dL — ABNORMAL LOW (ref 6–20)
CO2: 26 mmol/L (ref 22–32)
Calcium: 8.6 mg/dL — ABNORMAL LOW (ref 8.9–10.3)
Chloride: 107 mmol/L (ref 98–111)
Creatinine, Ser: 0.79 mg/dL (ref 0.44–1.00)
GFR, Estimated: >60 mL/min (ref >60)
Glucose, Bld: 103 mg/dL — ABNORMAL HIGH (ref 70–99)
Potassium: 3.8 mmol/L (ref 3.5–5.1)
Sodium: 138 mmol/L (ref 135–145)
Total Bilirubin: 0.2 mg/dL — ABNORMAL LOW (ref 0.3–1.2)
Total Protein: 5.5 g/dL — ABNORMAL LOW (ref 6.5–8.1)

## 2021-03-07 LAB — WET PREP, GENITAL
Clue Cells Wet Prep HPF POC: NONE SEEN
Sperm: NONE SEEN
Trich, Wet Prep: NONE SEEN
Yeast Wet Prep HPF POC: NONE SEEN

## 2021-03-07 LAB — I-STAT BETA HCG BLOOD, ED (MC, WL, AP ONLY): I-stat hCG, quantitative: <5 m[IU]/mL (ref <5)

## 2021-03-07 LAB — URINALYSIS, ROUTINE W REFLEX MICROSCOPIC
Bilirubin Urine: NEGATIVE
Glucose, UA: NEGATIVE mg/dL
Hgb urine dipstick: NEGATIVE
Ketones, ur: NEGATIVE mg/dL
Nitrite: NEGATIVE
Protein, ur: NEGATIVE mg/dL
Specific Gravity, Urine: 1.021 (ref 1.005–1.030)
pH: 6 (ref 5.0–8.0)

## 2021-03-07 MED ORDER — CEPHALEXIN 250 MG PO CAPS: 250.0000 mg | ORAL_CAPSULE | Freq: Three times a day (TID) | ORAL | 0 refills | Status: AC

## 2021-03-07 NOTE — Discharge Instructions (Addendum)
Take antibiotics as prescribed.  Take the entire course, even if symptoms improve. Use Tylenol and/or ibuprofen as needed for pain. Use heating pads to help with pain. Follow-up with either your primary care doctor or OB/GYN as needed if your symptoms or not improving.  If you do not have an OB/GYN, you follow-up with the clinic listed below. Return to the emergency room if you develop high fevers, persistent vomiting, severe worsening pain, any new, worsening, or concerning symptoms.

## 2021-03-07 NOTE — ED Triage Notes (Signed)
Pt reports lower abd pain x 3 days with white vaginal discharge.  Denies nausea, vomiting, and diarrhea.

## 2021-03-07 NOTE — ED Notes (Signed)
Patient discharge instructions reviewed with the patient. The patient verbalized understanding of instructions. Patient discharged. 

## 2021-03-07 NOTE — ED Provider Notes (Signed)
Emergency Medicine Provider Triage Evaluation Note  Emily Cain , a 25 y.o. female  was evaluated in triage.  Pt complains of lower abd pain x 3 days. Intermittent in nature. White vag dc. No urinary complaints. No diarrhea. LMP last week  Review of Systems  Positive: Lower abd pain, vag dc Negative: Fever, emesis, diarrhea, dysuria  Physical Exam  There were no vitals taken for this visit. Gen:   Awake, no distress   Resp:  Normal effort  MSK:   Moves extremities without difficulty  ABD:  Soft, mild suprapubic tenderness  Other:    Medical Decision Making  Medically screening exam initiated at 2:28 PM.  Appropriate orders placed.  Mike Berntsen was informed that the remainder of the evaluation will be completed by another provider, this initial triage assessment does not replace that evaluation, and the importance of remaining in the ED until their evaluation is complete.  Lower abd pain  Work up started   [employer], PA-C 03/07/21 1430    Terald Sleeper, MD 03/07/21 1435

## 2021-03-07 NOTE — ED Provider Notes (Signed)
[facility] EMERGENCY DEPARTMENT Provider Note   CSN: [id number] Arrival date & time: 03/07/21  1414     History No chief complaint on file.   Emily Cain is a 25 y.o. female presenting for evaluation of lower abdominal pain.  Patient states for the past 2 to 3 days, she has had intermittent severe lower abdominal pain, mostly suprapubic and on the left side.  She describes it as a cramping contraction-like pain.  She has tried Tylenol, ibuprofen, naproxen without improvement of symptoms.  She reports thin white vaginal discharge, but states this is not like her normal BV or previous STDs.  She denies odor or irritation of the vagina.  She is sexually active with 1 female partner, does not use condoms.  But she and her partner were completely treated after testing positive for STDs in April.  Her last period was last week, was normal for her.  She denies fevers, chills, nausea, vomiting, abnormal bowel movements.  She reports urinary frequency and pressure, which is relieved after urinating.  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    Past Surgical History:  Procedure Laterality Date   NO PAST SURGERIES       OB History     Gravida  2   Para  2   Term  2   Preterm  0   AB  0   Living  1      SAB  0   IAB  0   Ectopic  0   Multiple  0   Live Births  1           Family History  Problem Relation Age of Onset   Cancer Maternal Aunt    Cancer Maternal Uncle    Cancer Maternal Grandmother     Social History   Tobacco Use   Smoking status: Former    Packs/day: 0.25    Types: Cigarettes   Smokeless tobacco: Never  Vaping Use   Vaping Use: Every day  Substance Use Topics   Alcohol  use: Yes   Drug use: No    Home Medications Prior to Admission medications   Medication Sig Start Date End Date Taking? Authorizing Provider  cephALEXin (KEFLEX) 250 MG capsule Take 1 capsule (250 mg total) by mouth 3 (three) times daily for 5 days. 03/07/21 03/12/21 Yes Falcon Mccaskey, PA-C  gabapentin (NEURONTIN) 300 MG capsule Take 300 mg by mouth 3 (three) times daily.    [provider]  medroxyPROGESTERone (PROVERA) 10 MG tablet Take 1 tablet (10 mg total) by mouth daily for 10 days. 05/31/20 06/10/20  Lorelee New, PA-C  metroNIDAZOLE (FLAGYL) 500 MG tablet Take 1 tablet (500 mg total) by mouth 2 (two) times daily. 12/10/20   Mardella Layman, MD  naproxen (NAPROSYN) 500 MG tablet Take 1 tablet (500 mg total) by mouth 2 (two) times daily. 12/03/20   Ivette Loyal, NP  ondansetron (ZOFRAN ODT) 4 MG disintegrating tablet Take 1 tablet (4 mg total) by mouth every 8 (eight) hours as needed for nausea or vomiting. 12/10/20   Mardella Layman, MD  ondansetron (ZOFRAN) 4 MG tablet Take 1 tablet (4 mg total)  by mouth every 6 (six) hours. 12/23/20   Theron Arista, PA-C  sertraline (ZOLOFT) 25 MG tablet Take 1 tablet (25 mg total) by mouth daily. Patient not taking: Reported on 01/21/2018 07/05/17 04/26/19  Marny Lowenstein, PA-C    Allergies    Methadone  Review of Systems   Review of Systems  Gastrointestinal:  Positive for abdominal pain.  Genitourinary:  Positive for vaginal discharge.  All other systems reviewed and are negative.  Physical Exam Updated Vital Signs BP (!) 133/101 (BP Location: Left Arm)   Pulse 86   Temp 98.7 F (37.1 C) (Oral)   Resp 16   LMP 02/28/2021   SpO2 92%   Physical Exam Vitals and nursing note reviewed. Exam conducted with a chaperone present.  Constitutional:      General: She is not in acute distress.    Appearance: Normal appearance.     Comments: Resting in the bed in NAD  HENT:     Head: Normocephalic and atraumatic.  Eyes:      Conjunctiva/sclera: Conjunctivae normal.     Pupils: Pupils are equal, round, and reactive to light.  Cardiovascular:     Rate and Rhythm: Normal rate and regular rhythm.     Pulses: Normal pulses.  Pulmonary:     Effort: Pulmonary effort is normal. No respiratory distress.     Breath sounds: Normal breath sounds. No wheezing.     Comments: Speaking in full sentences.  Clear lung sounds in all fields. Abdominal:     General: There is no distension.     Palpations: Abdomen is soft. There is no mass.     Tenderness: There is abdominal tenderness. There is no right CVA tenderness, left CVA tenderness, guarding or rebound.     Comments: Suprapubic ttp  Genitourinary:    Comments: Miki Kins, RN, present as chaperone.   Very minimal cottage-cheese like discharge noted. No CMT or adnexal tenderness Musculoskeletal:        General: Normal range of motion.     Cervical back: Normal range of motion and neck supple.  Skin:    General: Skin is warm and dry.     Capillary Refill: Capillary refill takes less than 2 seconds.  Neurological:     Mental Status: She is alert and oriented to person, place, and time.  Psychiatric:        Mood and Affect: Mood and affect normal.        Speech: Speech normal.        Behavior: Behavior normal.    ED Results / Procedures / Treatments   Labs (all labs ordered are listed, but only abnormal results are displayed) Labs Reviewed  WET PREP, GENITAL - Abnormal; Notable for the following components:      Result Value   WBC, Wet Prep HPF POC MANY (*)    All other components within normal limits  COMPREHENSIVE METABOLIC PANEL - Abnormal; Notable for the following components:   Glucose, Bld 103 (*)    BUN 5 (*)    Calcium 8.6 (*)    Total Protein 5.5 (*)    Albumin 3.2 (*)    Total Bilirubin 0.2 (*)    All other components within normal limits  URINALYSIS, ROUTINE W REFLEX MICROSCOPIC - Abnormal; Notable for the following components:    APPearance HAZY (*)    Leukocytes,Ua MODERATE (*)    Bacteria, UA RARE (*)    All other components within normal limits  CBC WITH DIFFERENTIAL/PLATELET  I-STAT BETA HCG BLOOD, ED ([facility], [facility], [facility] ONLY)  GC/CHLAMYDIA PROBE AMP ([place]) NOT AT [facility]    EKG None  Radiology No results found.  Procedures Procedures   Medications Ordered in ED Medications - No data to display  ED Course  I have reviewed the triage vital signs and the nursing notes.  Pertinent labs & imaging results that were available during my care of the patient were reviewed by me and considered in my medical decision making (see chart for details).    MDM Rules/Calculators/A&P                           Patient presented for evaluation of lower abdominal pain and urinary symptoms.  On exam, patient appears nontoxic.  She does have tenderness palpation of the suprapubic abdomen.  GYN exam is overall reassuring, no significant discharge.  No CMT or adnexal tenderness.  Doubt PID, TOA, torsion.  Labs obtained from triage interpreted by me, overall reassuring.  UA is pending.  UA consistent with infection, especially in the setting of symptoms.  Will treat with antibiotics.  Discussed findings with patient.  At this time, patient appears safe for discharge.  Return precautions given.  Patient states he understands and agrees to plan.  Final Clinical Impression(s) / ED Diagnoses Final diagnoses:  Acute cystitis without hematuria    Rx / DC Orders ED Discharge Orders          Ordered    cephALEXin (KEFLEX) 250 MG capsule  3 times daily        03/07/21 1652             Geovannie Vilar, PA-C 03/07/21 1702    Vanetta Mulders, MD 03/12/21 1243

## 2021-03-09 LAB — GC/CHLAMYDIA PROBE AMP (~~LOC~~) NOT AT ARMC
Chlamydia: NEGATIVE
Comment: NEGATIVE
Comment: NORMAL
Neisseria Gonorrhea: NEGATIVE

## 2021-07-13 ENCOUNTER — Ambulatory Visit (INDEPENDENT_AMBULATORY_CARE_PROVIDER_SITE_OTHER): Payer: Medicaid Other

## 2021-07-13 ENCOUNTER — Ambulatory Visit (HOSPITAL_COMMUNITY)
Admission: EM | Admit: 2021-07-13 | Discharge: 2021-07-13 | Disposition: A | Discharge status: Home / Self Care | Payer: Medicaid Other | Attending: Sports Medicine | Admitting: Sports Medicine

## 2021-07-13 ENCOUNTER — Other Ambulatory Visit: Payer: Self-pay

## 2021-07-13 ENCOUNTER — Encounter (HOSPITAL_COMMUNITY): Payer: Self-pay | Admitting: Emergency Medicine

## 2021-07-13 DIAGNOSIS — J320 Chronic maxillary sinusitis: Secondary | ICD-10-CM | POA: Diagnosis not present

## 2021-07-13 DIAGNOSIS — R059 Cough, unspecified: Secondary | ICD-10-CM

## 2021-07-13 DIAGNOSIS — R0789 Other chest pain: Secondary | ICD-10-CM | POA: Diagnosis not present

## 2021-07-13 DIAGNOSIS — R509 Fever, unspecified: Secondary | ICD-10-CM | POA: Diagnosis not present

## 2021-07-13 DIAGNOSIS — R052 Subacute cough: Secondary | ICD-10-CM

## 2021-07-13 MED ORDER — AZITHROMYCIN 250 MG PO TABS: 250.0000 mg | ORAL_TABLET | Freq: Every day | ORAL | 0 refills | Status: DC

## 2021-07-13 NOTE — ED Triage Notes (Signed)
For 3-4 weeks having cough, runny nose, sensitivity to light, feeling faint. Chest pains with coughing. Cough gets up phlegm  Tried Vicks, Robitussin, benadryl, and others without relief.

## 2021-07-13 NOTE — Discharge Instructions (Addendum)
-   Azithromycin antibiotic: take 2 tablets day 1, and then 1 tablet for 4 additional days -May use honey, warm tea, over-the-counter cough drops or lozenges for the cough -Ensure adequate rest, proper hydration -If symptoms not improving over the next 4-5 days, would recommend following up with your PCP

## 2021-07-13 NOTE — ED Provider Notes (Signed)
[facility]    CSN: [id number] Arrival date & time: 07/13/21  1802      History   Chief Complaint Chief Complaint  Patient presents with   Cough    HPI Emily Cain is a 25 y.o. female who presents with cough and chest congestion x 3 weeks.   Cough Associated symptoms: chills, fever and headaches   Associated symptoms: no chest pain, no rash, no shortness of breath and no sore throat    Started 3 weeks ago - sore throat, cough, some chest tightness. Symptoms went away for a few days, but then symptoms returned.  Recently had friend come over - slept in same bed. This was about 3 days ago and they were coughing. Not sure of any sickness for her friend.   Now symptoms of fever and chills, ongoing cough with phlegm.  Associated headache, sensitivity to light.  Slightly nauseous, no vomiting.  No ear pain or sore throat.  Chest congestion and facial sinus congestion.  Fever of 100.6 today.  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 Authorizing Provider  azithromycin (ZITHROMAX) 250 MG tablet Take 1 tablet (250 mg total) by mouth daily. Take first 2 tablets together, then 1 every day until finished. 07/13/21  Yes Madelyn Brunner, DO  gabapentin (NEURONTIN) 300 MG capsule Take 300 mg by mouth 3 (three) times daily.    [provider]  medroxyPROGESTERone (PROVERA)  10 MG tablet Take 1 tablet (10 mg total) by mouth daily for 10 days. 05/31/20 06/10/20  Lorelee New, PA-C  metroNIDAZOLE (FLAGYL) 500 MG tablet Take 1 tablet (500 mg total) by mouth 2 (two) times daily. 12/10/20   Mardella Layman, MD  naproxen (NAPROSYN) 500 MG tablet Take 1 tablet (500 mg total) by mouth 2 (two) times daily. 12/03/20   Ivette Loyal, NP  ondansetron (ZOFRAN ODT) 4 MG disintegrating tablet Take 1 tablet (4 mg total) by mouth every 8 (eight) hours as needed for nausea or vomiting. 12/10/20   Mardella Layman, MD  ondansetron (ZOFRAN) 4 MG tablet Take 1 tablet (4 mg total) by mouth every 6 (six) hours. 12/23/20   Theron Arista, PA-C  sertraline (ZOLOFT) 25 MG tablet Take 1 tablet (25 mg total) by mouth daily. Patient not taking: Reported on 01/21/2018 07/05/17 04/26/19  Marny Lowenstein, PA-C    Family History Family History  Problem Relation Age of Onset   Cancer Maternal Aunt    Cancer Maternal Uncle    Cancer Maternal Grandmother     Social History Social History   Tobacco  Use   Smoking status: Former    Packs/day: 0.25    Types: Cigarettes   Smokeless tobacco: Never  Vaping Use   Vaping Use: Every day  Substance Use Topics   Alcohol use: Yes   Drug use: No     Allergies   Methadone   Review of Systems Review of Systems  Constitutional:  Positive for chills and fever.  HENT:  Positive for congestion and sinus pressure. Negative for sore throat.   Respiratory:  Positive for cough. Negative for shortness of breath.   Cardiovascular:  Negative for chest pain.  Gastrointestinal:  Negative for abdominal pain.  Skin:  Negative for rash.  Neurological:  Positive for headaches. Negative for dizziness and weakness.    Physical Exam Triage Vital Signs ED Triage Vitals  Enc Vitals Group     BP 07/13/21 1849 122/71     Pulse Rate 07/13/21 1849 (!) 103     Resp 07/13/21 1849 18     Temp 07/13/21 1849 (!) 100.6 F (38.1 C)     Temp Source 07/13/21 1849 Oral      SpO2 07/13/21 1849 99 %     Weight --      Height --      Head Circumference --      Peak Flow --      Pain Score 07/13/21 1848 8     Pain Loc --      Pain Edu? --      Excl. in GC? --    No data found.  Updated Vital Signs BP 122/71 (BP Location: Left Arm)   Pulse (!) 103   Temp (!) 100.6 F (38.1 C) (Oral)   Resp 18   LMP 07/12/2021   SpO2 99%   Visual Acuity Right Eye Distance:   Left Eye Distance:   Bilateral Distance:    Right Eye Near:   Left Eye Near:    Bilateral Near:     Physical Exam Constitutional:      Appearance: Normal appearance. She is ill-appearing. She is not toxic-appearing.  HENT:     Head: Normocephalic and atraumatic.     Comments: + Maxillary sinus TTP    Right Ear: Tympanic membrane and ear canal normal.     Left Ear: Tympanic membrane and ear canal normal.     Nose: Congestion present.     Mouth/Throat:     Mouth: Mucous membranes are moist.     Pharynx: No oropharyngeal exudate.  Eyes:     Extraocular Movements: Extraocular movements intact.     Pupils: Pupils are equal, round, and reactive to light.  Cardiovascular:     Rate and Rhythm: Normal rate.     Pulses: Normal pulses.  Pulmonary:     Effort: Pulmonary effort is normal. No respiratory distress.     Breath sounds: No wheezing, rhonchi or rales.  Musculoskeletal:     Cervical back: Normal range of motion and neck supple.  Skin:    Capillary Refill: Capillary refill takes less than 2 seconds.  Neurological:     General: No focal deficit present.     Mental Status: She is alert.  Psychiatric:        Mood and Affect: Mood normal.     UC Treatments / Results  Labs (all labs ordered are listed, but only abnormal results are displayed) Labs Reviewed - No data to display  EKG   Radiology DG Chest 2 View  Result Date: 07/13/2021 CLINICAL DATA:  Cough x 3-4 weeks, chest discomfort --> r/o pna vs walking pna EXAM: CHEST - 2 VIEW COMPARISON:  Radiograph 11/20/2014  FINDINGS: Cardiomediastinal silhouette is within normal limits. There is no focal airspace consolidation. There is no large pleural effusion. No visible pneumothorax. There is no acute osseous abnormality. IMPRESSION: No evidence of acute cardiopulmonary disease. Electronically Signed   By: Caprice Renshaw M.D.   On: 07/13/2021 19:56    Procedures Procedures (including critical care time)  Medications Ordered in UC Medications - No data to display  Initial Impression / Assessment and Plan / UC Course  I have reviewed the triage vital signs and the nursing notes.  Pertinent labs & imaging results that were available during my care of the patient were reviewed by me and considered in my medical decision making (see chart for details).     Chronic sinusitis Subacute cough Fever  Patient presents with cough, nasal congestion, and sinus pressure.  Initially symptoms did get better for about 2 days, although pain and symptoms returned.  Fever 100.6 F today.  Concern for double sickening symptoms given her initial improvement and then recurrence of symptoms.  Chest x-ray was obtained given her chest tightness and congestion, which did not reveal any acute abnormalities.  We will treat for bacterial sinusitis given longevity of illness and double sickening symptoms.  Azithromycin to be taken for 5 days.  Supportive symptoms discussed.  Return precautions provided, otherwise she is safe for discharge home and follow-up with her PCP if symptoms not improving. Final Clinical Impressions(s) / UC Diagnoses   Final diagnoses:  Subacute cough  Chronic maxillary sinusitis  Fever, unspecified     Discharge Instructions      - Azithromycin antibiotic: take 2 tablets day 1, and then 1 tablet for 4 additional days -May use honey, warm tea, over-the-counter cough drops or lozenges for the cough -Ensure adequate rest, proper hydration -If symptoms not improving over the next 4-5 days, would recommend  following up with your PCP      ED Prescriptions     Medication Sig Dispense Auth. Provider   azithromycin (ZITHROMAX) 250 MG tablet Take 1 tablet (250 mg total) by mouth daily. Take first 2 tablets together, then 1 every day until finished. 6 tablet Madelyn Brunner, DO      PDMP not reviewed this encounter.   Madelyn Brunner, DO 07/13/21 2105

## 2021-08-19 ENCOUNTER — Emergency Department (HOSPITAL_COMMUNITY)
Admission: EM | Admit: 2021-08-19 | Discharge: 2021-08-20 | Disposition: A | Discharge status: Home / Self Care | Payer: Medicaid Other | Attending: Emergency Medicine | Admitting: Emergency Medicine

## 2021-08-19 ENCOUNTER — Encounter (HOSPITAL_COMMUNITY): Payer: Self-pay

## 2021-08-19 ENCOUNTER — Other Ambulatory Visit: Payer: Self-pay

## 2021-08-19 DIAGNOSIS — J020 Streptococcal pharyngitis: Secondary | ICD-10-CM | POA: Insufficient documentation

## 2021-08-19 DIAGNOSIS — Z20822 Contact with and (suspected) exposure to covid-19: Secondary | ICD-10-CM | POA: Diagnosis not present

## 2021-08-19 DIAGNOSIS — J029 Acute pharyngitis, unspecified: Secondary | ICD-10-CM | POA: Diagnosis present

## 2021-08-19 NOTE — ED Provider Notes (Signed)
[facility]EMERGENCY DEPT Provider Note   CSN: [id number] Arrival date & time: 08/19/21  2334     History  Chief Complaint  Patient presents with   Sore Throat    Emily Cain is a 26 y.o. female presenting for evaluation of ST.   Pt states sxs began today. No fever, congestion, cough, n/v. No sick contacts. Has tried OTC meds without improvement.   HPI     Home Medications Prior to Admission medications   Medication Sig Start Date End Date Taking? Authorizing Provider  lidocaine (XYLOCAINE) 2 % solution Use as directed 15 mLs in the mouth or throat as needed for mouth pain. 08/20/21  Yes Clemon Devaul, PA-C  penicillin v potassium (VEETID) 500 MG tablet Take 1 tablet (500 mg total) by mouth 2 (two) times daily for 10 days. 08/20/21 08/30/21 Yes Stefanos Haynesworth, PA-C  azithromycin (ZITHROMAX) 250 MG tablet Take 1 tablet (250 mg total) by mouth daily. Take first 2 tablets together, then 1 every day until finished. 07/13/21   Madelyn Brunner, DO  gabapentin (NEURONTIN) 300 MG capsule Take 300 mg by mouth 3 (three) times daily.    [provider]  medroxyPROGESTERone (PROVERA) 10 MG tablet Take 1 tablet (10 mg total) by mouth daily for 10 days. 05/31/20 06/10/20  Lorelee New, PA-C  metroNIDAZOLE (FLAGYL) 500 MG tablet Take 1 tablet (500 mg total) by mouth 2 (two) times daily. 12/10/20   Mardella Layman, MD  naproxen (NAPROSYN) 500 MG tablet Take 1 tablet (500 mg total) by mouth 2 (two) times daily. 12/03/20   Ivette Loyal, NP  ondansetron (ZOFRAN ODT) 4 MG disintegrating tablet Take 1 tablet (4 mg total) by mouth every 8 (eight) hours as needed for nausea or vomiting. 12/10/20   Mardella Layman, MD  ondansetron (ZOFRAN) 4 MG tablet Take 1 tablet (4 mg total) by mouth every 6 (six) hours. 12/23/20   Theron Arista, PA-C  sertraline (ZOLOFT) 25 MG tablet Take 1 tablet (25 mg total) by mouth daily. Patient not taking: Reported on 01/21/2018 07/05/17 04/26/19   Marny Lowenstein, PA-C      Allergies    Methadone    Review of Systems   Review of Systems  Constitutional:  Negative for fever.  HENT:  Positive for sore throat.    Physical Exam Updated Vital Signs BP (!) 142/88 (BP Location: Left Arm)    Pulse 90    Temp 98.3 F (36.8 C) (Oral)    Resp 17    Ht 5' 7.5" (1.715 m)    Wt 83.9 kg    SpO2 100%    BMI 28.55 kg/m  Physical Exam Vitals and nursing note reviewed.  Constitutional:      General: She is not in acute distress.    Appearance: She is well-developed.  HENT:     Head: Normocephalic and atraumatic.     Mouth/Throat:     Pharynx: Uvula midline. Posterior oropharyngeal erythema present.     Tonsils: 2+ on the right. 2+ on the left.     Comments: Erythema and swelling of the OP. Uvula midline. No muffled voice. No trismus. No difficulty handling secretions Eyes:     Extraocular Movements: Extraocular movements intact.  Cardiovascular:     Rate and Rhythm: Normal rate and regular rhythm.     Pulses: Normal pulses.  Pulmonary:     Effort: Pulmonary effort is normal.     Breath sounds: Normal breath sounds.  Comments: CTAB. SpO2 100% on RA Abdominal:     General: There is no distension.  Musculoskeletal:        General: Normal range of motion.     Cervical back: Normal range of motion.  Skin:    General: Skin is warm.     Findings: No rash.  Neurological:     Mental Status: She is alert and oriented to person, place, and time.    ED Results / Procedures / Treatments   Labs (all labs ordered are listed, but only abnormal results are displayed) Labs Reviewed  GROUP A STREP BY PCR - Abnormal; Notable for the following components:      Result Value   Group A Strep by PCR DETECTED (*)    All other components within normal limits  RESP PANEL BY RT-PCR (FLU A&B, COVID) [id number]    EKG None  Radiology No results found.  Procedures Procedures    Medications Ordered in ED Medications - No data to display  ED  Course/ Medical Decision Making/ A&P                           Medical Decision Making   This patient presents to the ED for concern of sore throat. This involves a number of treatment options, and is a complaint that carries with it a low risk of complications and morbidity.  The differential diagnosis includes strep, viral illness, peritonsillar abscess, retropharyngeal abscess, dental infection/pain  Lab Tests:  I ordered, and personally interpreted labs.  The pertinent results include: Strep test positive.  COVID and flu negative   Dispostion:  After consideration of the diagnostic results and the patients response to treatment, I feel that the patent would benefit from outpatient treatment for her strep throat.  Discussed importance of taking antibiotics.  We will also give viscous lidocaine to help with pain control.  I encouraged hydration.  Patient cautioned that strep is contagious.  Exam reassuring, no evidence for peritonsillar abscess at this time.  At this time, patient appears safe for discharge.  Return precautions given.  Patient states she understands and agrees to plan   Final Clinical Impression(s) / ED Diagnoses Final diagnoses:  Strep throat    Rx / DC Orders ED Discharge Orders          Ordered    penicillin v potassium (VEETID) 500 MG tablet  2 times daily        08/20/21 0041    lidocaine (XYLOCAINE) 2 % solution  As needed        08/20/21 0041              Keirsten Matuska, PA-C 08/20/21 0052    Zadie Rhine, MD 08/20/21 [phone]

## 2021-08-19 NOTE — ED Triage Notes (Signed)
Pt reports with sore throat since this morning. Pt's tonsils are swollen and red.

## 2021-08-20 LAB — GROUP A STREP BY PCR: Group A Strep by PCR: DETECTED — AB

## 2021-08-20 LAB — RESP PANEL BY RT-PCR (FLU A&B, COVID) ARPGX2
Influenza A by PCR: NEGATIVE
Influenza B by PCR: NEGATIVE
SARS Coronavirus 2 by RT PCR: NEGATIVE

## 2021-08-20 MED ORDER — PENICILLIN V POTASSIUM 500 MG PO TABS: 500.0000 mg | ORAL_TABLET | Freq: Two times a day (BID) | ORAL | 0 refills | Status: AC

## 2021-08-20 MED ORDER — LIDOCAINE VISCOUS HCL 2 % MT SOLN: 15.0000 mL | OROMUCOSAL | 0 refills | Status: DC | PRN

## 2021-08-20 NOTE — Discharge Instructions (Addendum)
Take antibiotics as prescribed. Take the entire course, even if symptoms improve.  Use viscous lidocaine to help with pain control.  Use Tylenol or ibuprofen as needed for pain. Make sure staying well-hydrated water. Return to the emergency room if you develop difficulty opening your mouth, your voice becomes muffled, you cannot swallow your own spit, or with any new, worsening, or concerning symptoms

## 2021-09-25 ENCOUNTER — Other Ambulatory Visit: Payer: Self-pay

## 2021-09-25 ENCOUNTER — Encounter (HOSPITAL_COMMUNITY): Payer: Self-pay

## 2021-09-25 ENCOUNTER — Emergency Department (HOSPITAL_COMMUNITY)
Admission: EM | Admit: 2021-09-25 | Discharge: 2021-09-26 | Disposition: A | Discharge status: Home / Self Care | Payer: Medicaid Other | Attending: Emergency Medicine | Admitting: Emergency Medicine

## 2021-09-25 DIAGNOSIS — N760 Acute vaginitis: Secondary | ICD-10-CM | POA: Insufficient documentation

## 2021-09-25 DIAGNOSIS — N63 Unspecified lump in unspecified breast: Secondary | ICD-10-CM | POA: Insufficient documentation

## 2021-09-25 DIAGNOSIS — R102 Pelvic and perineal pain: Secondary | ICD-10-CM | POA: Diagnosis not present

## 2021-09-25 DIAGNOSIS — R222 Localized swelling, mass and lump, trunk: Secondary | ICD-10-CM

## 2021-09-25 DIAGNOSIS — N898 Other specified noninflammatory disorders of vagina: Secondary | ICD-10-CM | POA: Diagnosis present

## 2021-09-25 DIAGNOSIS — B9689 Other specified bacterial agents as the cause of diseases classified elsewhere: Secondary | ICD-10-CM

## 2021-09-25 NOTE — ED Provider Triage Note (Signed)
Emergency Medicine Provider Triage Evaluation Note  Analeya Luallen , a 26 y.o. female  was evaluated in triage.  Pt complains of left-sided pelvic pain x3 days. Admits to increased white discharge. She has been taking OTC yeast medication with no relief. She is currently sexually active. No fever or chills.   Review of Systems  Positive: Pelvic pain Negative: fever  Physical Exam  BP (!) 141/95 (BP Location: Right Arm)    Pulse (!) 116    Temp 98.4 F (36.9 C) (Oral)    Resp 18    Ht 5\' 8"  (1.727 m)    Wt 83.9 kg    SpO2 98%    BMI 28.13 kg/m  Gen:   Awake, no distress   Resp:  Normal effort  MSK:   Moves extremities without difficulty  Other:    Medical Decision Making  Medically screening exam initiated at 10:43 PM.  Appropriate orders placed.  Kiala Faraj was informed that the remainder of the evaluation will be completed by another provider, this initial triage assessment does not replace that evaluation, and the importance of remaining in the ED until their evaluation is complete.  labs   Kyung Rudd, Mannie Stabile 09/25/21 2244

## 2021-09-25 NOTE — ED Triage Notes (Addendum)
Pt reports abdominal pain x3 days. Denies N/V/D. She also endorses lump to her breast for about 3 weeks. She also reports attempting to self treat BV symptoms at home.

## 2021-09-26 LAB — URINALYSIS, ROUTINE W REFLEX MICROSCOPIC
Bacteria, UA: NONE SEEN
Bilirubin Urine: NEGATIVE
Glucose, UA: NEGATIVE mg/dL
Hgb urine dipstick: NEGATIVE
Ketones, ur: NEGATIVE mg/dL
Nitrite: NEGATIVE
Protein, ur: NEGATIVE mg/dL
Specific Gravity, Urine: 1.019 (ref 1.005–1.030)
pH: 7 (ref 5.0–8.0)

## 2021-09-26 LAB — COMPREHENSIVE METABOLIC PANEL
ALT: 12 U/L (ref 0–44)
AST: 14 U/L — ABNORMAL LOW (ref 15–41)
Albumin: 4.3 g/dL (ref 3.5–5.0)
Alkaline Phosphatase: 57 U/L (ref 38–126)
Anion gap: 6 (ref 5–15)
BUN: 9 mg/dL (ref 6–20)
CO2: 25 mmol/L (ref 22–32)
Calcium: 9.3 mg/dL (ref 8.9–10.3)
Chloride: 103 mmol/L (ref 98–111)
Creatinine, Ser: 0.78 mg/dL (ref 0.44–1.00)
GFR, Estimated: >60 mL/min (ref >60)
Glucose, Bld: 92 mg/dL (ref 70–99)
Potassium: 4.1 mmol/L (ref 3.5–5.1)
Sodium: 134 mmol/L — ABNORMAL LOW (ref 135–145)
Total Bilirubin: 0.1 mg/dL — ABNORMAL LOW (ref 0.3–1.2)
Total Protein: 7.9 g/dL (ref 6.5–8.1)

## 2021-09-26 LAB — CBC WITH DIFFERENTIAL/PLATELET
Abs Immature Granulocytes: 0.02 10*3/uL (ref 0.00–0.07)
Basophils Absolute: 0 10*3/uL (ref 0.0–0.1)
Basophils Relative: 1 %
Eosinophils Absolute: 0.1 10*3/uL (ref 0.0–0.5)
Eosinophils Relative: 2 %
HCT: 40.2 % (ref 36.0–46.0)
Hemoglobin: 12.8 g/dL (ref 12.0–15.0)
Immature Granulocytes: 0 %
Lymphocytes Relative: 48 %
Lymphs Abs: 2.8 10*3/uL (ref 0.7–4.0)
MCH: 26.2 pg (ref 26.0–34.0)
MCHC: 31.8 g/dL (ref 30.0–36.0)
MCV: 82.2 fL (ref 80.0–100.0)
Monocytes Absolute: 0.4 10*3/uL (ref 0.1–1.0)
Monocytes Relative: 6 %
Neutro Abs: 2.6 10*3/uL (ref 1.7–7.7)
Neutrophils Relative %: 43 %
Platelets: 265 10*3/uL (ref 150–400)
RBC: 4.89 MIL/uL (ref 3.87–5.11)
RDW: 14.5 % (ref 11.5–15.5)
WBC: 5.9 10*3/uL (ref 4.0–10.5)
nRBC: 0 % (ref 0.0–0.2)

## 2021-09-26 LAB — WET PREP, GENITAL
Sperm: NONE SEEN
Trich, Wet Prep: NONE SEEN
WBC, Wet Prep HPF POC: <10 (ref <10)
Yeast Wet Prep HPF POC: NONE SEEN

## 2021-09-26 LAB — LIPASE, BLOOD: Lipase: 37 U/L (ref 11–51)

## 2021-09-26 LAB — I-STAT BETA HCG BLOOD, ED (MC, WL, AP ONLY): I-stat hCG, quantitative: <5 m[IU]/mL (ref <5)

## 2021-09-26 MED ORDER — METRONIDAZOLE 500 MG PO TABS: 500.0000 mg | ORAL_TABLET | Freq: Two times a day (BID) | ORAL | 0 refills | Status: DC

## 2021-09-26 MED ORDER — METRONIDAZOLE 0.75 % VA GEL: 1.0000 | Freq: Every day | VAGINAL | 0 refills | Status: AC

## 2021-09-26 NOTE — ED Provider Notes (Signed)
[facility]EMER[facility]PT Provider Note   CSN: [id number] Arrival date & time: 09/25/21  2222     History  Chief Complaint  Patient presents with   Abdominal Pain   Breast Mass    Emily Cain is a 26 y.o. female.who presents with breast nodule and vaginal discharge along with suprapubic pain.  Regarding her breast mass.  She has noticed a firm nodule on her sternum that has been present for at least 3 weeks.  It is nontender, mobile, not red or painful.  She has no nipple discharge unexplained weight loss or night sweats. Patient also complains of vaginal discharge.  She states that she has recurrent discharge and has had recurrent issues with bacterial vaginosis and yeast infections.  She has noticed a thin white discharge recently after slight odor.  She also complains of some suprapubic pain but denies fever, chills, distention, pain with intercourse.   Abdominal Pain     Home Medications Prior to Admission medications   Medication Sig Start Date End Date Taking? Authorizing Provider  azithromycin (ZITHROMAX) 250 MG tablet Take 1 tablet (250 mg total) by mouth daily. Take first 2 tablets together, then 1 every day until finished. 07/13/21   Madelyn Brunner, DO  gabapentin (NEURONTIN) 300 MG capsule Take 300 mg by mouth 3 (three) times daily.    [provider]  lidocaine (XYLOCAINE) 2 % solution Use as directed 15 mLs in the mouth or throat as needed for mouth pain. 08/20/21   Caccavale, Sophia, PA-C  medroxyPROGESTERone (PROVERA) 10 MG tablet Take 1 tablet (10 mg total) by mouth daily for 10 days. 05/31/20 06/10/20  Lorelee New, PA-C  metroNIDAZOLE (FLAGYL) 500 MG tablet Take 1 tablet (500 mg total) by mouth 2 (two) times daily. 12/10/20   Mardella Layman, MD  naproxen (NAPROSYN) 500 MG tablet Take 1 tablet (500 mg total) by mouth 2 (two) times daily. 12/03/20   Ivette Loyal, NP  ondansetron (ZOFRAN ODT) 4 MG disintegrating tablet Take 1 tablet (4 mg  total) by mouth every 8 (eight) hours as needed for nausea or vomiting. 12/10/20   Mardella Layman, MD  ondansetron (ZOFRAN) 4 MG tablet Take 1 tablet (4 mg total) by mouth every 6 (six) hours. 12/23/20   Theron Arista, PA-C  sertraline (ZOLOFT) 25 MG tablet Take 1 tablet (25 mg total) by mouth daily. Patient not taking: Reported on 01/21/2018 07/05/17 04/26/19  Marny Lowenstein, PA-C      Allergies    Methadone    Review of Systems   Review of Systems  Gastrointestinal:  Positive for abdominal pain.   Physical Exam Updated Vital Signs BP 117/78    Pulse 67    Temp 98.4 F (36.9 C) (Oral)    Resp 18    Ht 5\' 8"  (1.727 m)    Wt 83.9 kg    SpO2 100%    BMI 28.13 kg/m  Physical Exam Vitals and nursing note reviewed. Exam conducted with a chaperone present.  Constitutional:      General: She is not in acute distress.    Appearance: She is well-developed. She is not diaphoretic.  HENT:     Head: Normocephalic and atraumatic.     Right Ear: External ear normal.     Left Ear: External ear normal.     Nose: Nose normal.     Mouth/Throat:     Mouth: Mucous membranes are moist.  Eyes:     General: No scleral icterus.  Extraocular Movements: Extraocular movements intact.     Conjunctiva/sclera: Conjunctivae normal.     Pupils: Pupils are equal, round, and reactive to light.  Cardiovascular:     Rate and Rhythm: Normal rate and regular rhythm.     Heart sounds: Normal heart sounds. No murmur heard.   No friction rub. No gallop.  Pulmonary:     Effort: Pulmonary effort is normal. No respiratory distress.     Breath sounds: Normal breath sounds.  Chest:     Comments: Well-circumscribed, 1 cm, circular nodule over the left inferior sternum next to the breast.  Feels consistent with epidermoid cyst no erythema or tenderness Abdominal:     General: Bowel sounds are normal. There is no distension.     Palpations: Abdomen is soft. There is no mass.     Tenderness: There is abdominal tenderness in  the suprapubic area. There is no guarding.  Genitourinary:    Comments: Normal female external genitalia, vaginal walls pink and well rugated, no irritation or erythema, thin white discharge noted in the vaginal vault, cervix without CMT or cervical discharge, no tenderness, fullness of the adnexae Musculoskeletal:     Cervical back: Normal range of motion.  Skin:    General: Skin is warm and dry.  Neurological:     Mental Status: She is alert and oriented to person, place, and time.  Psychiatric:        Behavior: Behavior normal.    ED Results / Procedures / Treatments   Labs (all labs ordered are listed, but only abnormal results are displayed) Labs Reviewed  COMPREHENSIVE METABOLIC PANEL - Abnormal; Notable for the following components:      Result Value   Sodium 134 (*)    AST 14 (*)    Total Bilirubin 0.1 (*)    All other components within normal limits  WET PREP, GENITAL  CBC WITH DIFFERENTIAL/PLATELET  LIPASE, BLOOD  URINALYSIS, ROUTINE W REFLEX MICROSCOPIC  I-STAT BETA HCG BLOOD, ED ([facility], [facility], [facility] ONLY)  GC/CHLAMYDIA PROBE AMP ([place]) NOT AT [facility]    EKG None  Radiology No results found.  Procedures Procedures    Medications Ordered in ED Medications - No data to display  ED Course/ Medical Decision Making/ A&P                           Medical Decision Making 26 year old female who presents with breast mass and pelvic pain with discharge. She has a benign pelvic examination and a benign abdominal examination.  Her work-up is positive for bacterial vaginosis.  Patient has vomiting with oral Flagyl and will be discharged with [facility].  We will have her follow-up with GYN regarding her current recurrent vaginal symptoms and chest wall mass.  Suspect that this is an epidermal inclusion cyst on the sternum.  No signs or symptoms of cellulitis or mastitis.  Patient appears appropriate for discharge at this time  Problems Addressed: BV (bacterial vaginosis):  acute illness or injury Chest wall mass: undiagnosed new problem with uncertain prognosis  Amount and/or Complexity of Data Reviewed Labs: ordered.    Details: CMP CBC lipase all within normal limits, negative pregnancy test, wet prep positive for BV, lipase and urine within normal limits  Risk Prescription drug management.  Final Clinical Impression(s) / ED Diagnoses Final diagnoses:  None    Rx / DC Orders ED Discharge Orders     None  Arthor Captain, PA-C 09/26/21 0549    Milagros Loll, MD 09/28/21 [phone]

## 2021-09-26 NOTE — Discharge Instructions (Addendum)
Try PH-D Boric acid suppositories (also available at [facility]- they have their own brand and it's cheaper) 500mg  suppositories.  Take one every other night for 3 nights week one and then one, once a week for maintenance unless symptoms worsen. DO NOT use this while taking the flagyl. Follow-up with OB/GYN for further evaluation of your chronic vaginal issues and the mass on your chest wall.  You could also have have this seen by your primary care doctor soon as possible.

## 2021-09-28 LAB — GC/CHLAMYDIA PROBE AMP (~~LOC~~) NOT AT ARMC
Chlamydia: NEGATIVE
Comment: NEGATIVE
Comment: NORMAL
Neisseria Gonorrhea: NEGATIVE

## 2021-12-15 ENCOUNTER — Encounter: Payer: Medicaid Other | Admitting: Family Medicine

## 2022-03-17 ENCOUNTER — Other Ambulatory Visit (HOSPITAL_COMMUNITY)
Admission: RE | Admit: 2022-03-17 | Discharge: 2022-03-17 | Disposition: A | Discharge status: Home / Self Care | Payer: Medicaid Other | Source: Ambulatory Visit | Attending: Obstetrics and Gynecology | Admitting: Obstetrics and Gynecology

## 2022-03-17 ENCOUNTER — Encounter: Payer: Self-pay | Admitting: Obstetrics and Gynecology

## 2022-03-17 ENCOUNTER — Ambulatory Visit: Payer: Medicaid Other | Admitting: Obstetrics and Gynecology

## 2022-03-17 ENCOUNTER — Other Ambulatory Visit: Payer: Self-pay

## 2022-03-17 VITALS — BP 119/69 | HR 68 | Wt 209.3 lb

## 2022-03-17 DIAGNOSIS — N898 Other specified noninflammatory disorders of vagina: Secondary | ICD-10-CM | POA: Diagnosis not present

## 2022-03-17 DIAGNOSIS — N926 Irregular menstruation, unspecified: Secondary | ICD-10-CM | POA: Diagnosis not present

## 2022-03-17 DIAGNOSIS — Z01419 Encounter for gynecological examination (general) (routine) without abnormal findings: Secondary | ICD-10-CM

## 2022-03-17 DIAGNOSIS — Z113 Encounter for screening for infections with a predominantly sexual mode of transmission: Secondary | ICD-10-CM | POA: Diagnosis not present

## 2022-03-17 LAB — POCT PREGNANCY, URINE: Preg Test, Ur: NEGATIVE

## 2022-03-17 NOTE — Progress Notes (Signed)
GYNECOLOGY ANNUAL PREVENTATIVE CARE ENCOUNTER NOTE  History:     Emily Cain is a 26 y.o. G74P2001 female here for a routine annual gynecologic exam.  Current complaints: occasional irregular menses.  This is her first missed cycle.  UPT negative.  Pt does note intermittent irregular cycles and some irregular hair growth.  She is concerned regarding PCOS.  Gynecologic History Patient's last menstrual period was 02/08/2022 (exact date). Contraception: none Last Pap: pt cannot recall, none in system.   Obstetric History OB History  Gravida Para Term Preterm AB Living  2 2 2  0 0 1  SAB IAB Ectopic Multiple Live Births  0 0 0 0 1    # Outcome Date GA Lbr Len/2nd Weight Sex Delivery Anes PTL Lv  2 Term 06/06/17 [redacted]w[redacted]d / 00:11 6 lb 8.1 oz (2.95 kg) F Vag-Spont EPI  LIV  1 Term 05/22/16 [redacted]w[redacted]d 08:46 / 02:13 8 lb 0.6 oz (3.645 kg) M Vag-Spont EPI  FD    Past Medical History:  Diagnosis Date   Anemia    Environmental allergies    History of IUFD     Past Surgical History:  Procedure Laterality Date   NO PAST SURGERIES      Current Outpatient Medications on File Prior to Visit  Medication Sig Dispense Refill   azithromycin (ZITHROMAX) 250 MG tablet Take 1 tablet (250 mg total) by mouth daily. Take first 2 tablets together, then 1 every day until finished. (Patient not taking: Reported on 03/17/2022) 6 tablet 0   gabapentin (NEURONTIN) 300 MG capsule Take 300 mg by mouth 3 (three) times daily. (Patient not taking: Reported on 03/17/2022)     lidocaine (XYLOCAINE) 2 % solution Use as directed 15 mLs in the mouth or throat as needed for mouth pain. (Patient not taking: Reported on 03/17/2022) 100 mL 0   medroxyPROGESTERone (PROVERA) 10 MG tablet Take 1 tablet (10 mg total) by mouth daily for 10 days. 10 tablet 0   naproxen (NAPROSYN) 500 MG tablet Take 1 tablet (500 mg total) by mouth 2 (two) times daily. (Patient not taking: Reported on 03/17/2022) 30 tablet 0   ondansetron (ZOFRAN ODT) 4  MG disintegrating tablet Take 1 tablet (4 mg total) by mouth every 8 (eight) hours as needed for nausea or vomiting. (Patient not taking: Reported on 03/17/2022) 20 tablet 0   ondansetron (ZOFRAN) 4 MG tablet Take 1 tablet (4 mg total) by mouth every 6 (six) hours. (Patient not taking: Reported on 03/17/2022) 12 tablet 0   [DISCONTINUED] sertraline (ZOLOFT) 25 MG tablet Take 1 tablet (25 mg total) by mouth daily. (Patient not taking: Reported on 01/21/2018) 30 tablet 1   No current facility-administered medications on file prior to visit.    Allergies  Allergen Reactions   Methadone Hives   Flagyl [Metronidazole] Nausea And Vomiting    Social History:  reports that she has quit smoking. Her smoking use included cigarettes. She smoked an average of .25 packs per day. She has never used smokeless tobacco. She reports current alcohol use. She reports that she does not use drugs.  Family History  Problem Relation Age of Onset   Cancer Maternal Aunt    Cancer Maternal Uncle    Cancer Maternal Grandmother     The following portions of the patient's history were reviewed and updated as appropriate: allergies, current medications, past family history, past medical history, past social history, past surgical history and problem list.  Review of Systems Pertinent items noted  in HPI and remainder of comprehensive ROS otherwise negative.  Physical Exam:  BP 119/69   Pulse 68   Wt 209 lb 4.8 oz (94.9 kg)   LMP 02/08/2022 (Exact Date)   BMI 31.82 kg/m  CONSTITUTIONAL: Well-developed, well-nourished female in no acute distress.  HENT:  Normocephalic, atraumatic, External right and left ear normal. Oropharynx is clear and moist EYES: Conjunctivae and EOM are normal.  NECK: Normal range of motion, supple, no masses.  Normal thyroid.  SKIN: Skin is warm and dry. No rash noted. Not diaphoretic. No erythema. No pallor. MUSCULOSKELETAL: Normal range of motion. No tenderness.  No cyanosis, clubbing, or  edema.  2+ distal pulses. NEUROLOGIC: Alert and oriented to person, place, and time. Normal reflexes, muscle tone coordination.  PSYCHIATRIC: Normal mood and affect. Normal behavior. Normal judgment and thought content. CARDIOVASCULAR: Normal heart rate noted, regular rhythm RESPIRATORY: Clear to auscultation bilaterally. Effort and breath sounds normal, no problems with respiration noted. BREASTS: deferred ABDOMEN: Soft, no distention noted.  No tenderness, rebound or guarding.  PELVIC:Pt has some swelling in left batholin's area, but no tenderness or drainage.  The area of swelling is not tight or fluctuant. normal appearing vaginal mucosa and cervix.  No abnormal discharge noted.  Pap smear obtained.  Vaginal swab taken.  Normal uterine size, no other palpable masses, no uterine or adnexal tenderness.  Performed in the presence of a chaperone.   Assessment and Plan:    1. Women's annual routine gynecological examination Normal annual exam  - Cytology - PAP( [place])  2. Routine screening for STI (sexually transmitted infection) Per pt request - Cervicovaginal ancillary only( [place])  3. Vaginal discharge Vaginal swab to eval discharge  4. Abnormal menses Pt advised to check menstrual calender x 4-5 months. If she is concerned about PCOS, advise 10% weight loss before checking battery of lab work  Will follow up results of pap smear and manage accordingly. F/u in 5 months to discuss menses. Routine preventative health maintenance measures emphasized. Please refer to After Visit Summary for other counseling recommendations.      Mariel Aloe, MD, FACOG Obstetrician & Gynecologist, [facility], [facility]

## 2022-03-18 LAB — CERVICOVAGINAL ANCILLARY ONLY
Bacterial Vaginitis (gardnerella): POSITIVE — AB
Candida Glabrata: NEGATIVE
Candida Vaginitis: POSITIVE — AB
Chlamydia: NEGATIVE
Comment: NEGATIVE
Comment: NEGATIVE
Comment: NEGATIVE
Comment: NEGATIVE
Comment: NEGATIVE
Comment: NORMAL
Neisseria Gonorrhea: NEGATIVE
Trichomonas: NEGATIVE

## 2022-03-19 LAB — CYTOLOGY - PAP: Diagnosis: NEGATIVE

## 2022-03-24 ENCOUNTER — Encounter: Payer: Self-pay | Admitting: General Practice

## 2022-03-24 DIAGNOSIS — B379 Candidiasis, unspecified: Secondary | ICD-10-CM

## 2022-03-24 DIAGNOSIS — B9689 Other specified bacterial agents as the cause of diseases classified elsewhere: Secondary | ICD-10-CM

## 2022-03-24 MED ORDER — FLUCONAZOLE 150 MG PO TABS: 150.0000 mg | ORAL_TABLET | Freq: Once | ORAL | 0 refills | Status: AC

## 2022-03-24 MED ORDER — METRONIDAZOLE 0.75 % VA GEL
1.0000 | Freq: Every day | VAGINAL | 0 refills | Status: DC
Start: 2022-03-24 — End: 2022-08-29

## 2022-05-24 ENCOUNTER — Telehealth: Payer: Medicaid Other | Admitting: Emergency Medicine

## 2022-05-24 ENCOUNTER — Other Ambulatory Visit: Payer: Self-pay | Admitting: Obstetrics and Gynecology

## 2022-05-24 DIAGNOSIS — N760 Acute vaginitis: Secondary | ICD-10-CM

## 2022-05-24 MED ORDER — FLUCONAZOLE 150 MG PO TABS: ORAL_TABLET | ORAL | 0 refills | Status: DC

## 2022-05-24 MED ORDER — METRONIDAZOLE 1 % EX GEL: Freq: Every day | CUTANEOUS | 0 refills | Status: AC

## 2022-05-24 NOTE — Progress Notes (Signed)
Virtual Visit Consent   Emily Cain, you are scheduled for a virtual visit with a  provider today. Just as with appointments in the office, your consent must be obtained to participate. Your consent will be active for this visit and any virtual visit you may have with one of our providers in the next 365 days. If you have a MyChart account, a copy of this consent can be sent to you electronically.  As this is a virtual visit, video technology does not allow for your provider to perform a traditional examination. This may limit your provider's ability to fully assess your condition. If your provider identifies any concerns that need to be evaluated in person or the need to arrange testing (such as labs, EKG, etc.), we will make arrangements to do so. Although advances in technology are sophisticated, we cannot ensure that it will always work on either your end or our end. If the connection with a video visit is poor, the visit may have to be switched to a telephone visit. With either a video or telephone visit, we are not always able to ensure that we have a secure connection.  By engaging in this virtual visit, you consent to the provision of healthcare and authorize for your insurance to be billed (if applicable) for the services provided during this visit. Depending on your insurance coverage, you may receive a charge related to this service.  I need to obtain your verbal consent now. Are you willing to proceed with your visit today? Emily Cain has provided verbal consent on 05/24/2022 for a virtual visit (video or telephone). Emily Horseman, PA-C  Date: 05/24/2022 1:53 PM  Virtual Visit via Video Note   I, Emily Cain, connected with  Emily Cain  ([id number], [birth year]) on 05/24/22 at  1:45 PM EDT by a video-enabled telemedicine application and verified that I am speaking with the correct person using two identifiers.  Location: Patient: Virtual Visit Location Patient:  Mobile Provider: Virtual Visit Location Provider: Home   I discussed the limitations of evaluation and management by telemedicine and the availability of in person appointments. The patient expressed understanding and agreed to proceed.    History of Present Illness: Emily Cain is a 26 y.o. who identifies as a female who was assigned female at birth, and is being seen today for vaginal discharge.  States that she is having some itching as well.  States that it seem similar to prior BV.  Denies abdominal pain.  Denies vaginal bleeding.  States that it smells like BV.  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 Take first 2 tablets together, then 1 every day until finished. (Patient not taking: Reported on 03/17/2022), Disp: 6 tablet, Rfl: 0   gabapentin (NEURONTIN) 300 MG capsule, Take 300 mg by mouth 3 (three) times daily. (Patient not  taking: Reported on 03/17/2022), Disp: , Rfl:    lidocaine (XYLOCAINE) 2 % solution, Use as directed 15 mLs in the mouth or throat as needed for mouth pain. (Patient not taking: Reported on 03/17/2022), Disp: 100 mL, Rfl: 0   medroxyPROGESTERone (PROVERA) 10 MG tablet, Take 1 tablet (10 mg total) by mouth daily for 10 days., Disp: 10 tablet, Rfl: 0   metroNIDAZOLE (METROGEL VAGINAL) 0.75 % vaginal gel, Place 1 Applicatorful vaginally at bedtime., Disp:  70 g, Rfl: 0   naproxen (NAPROSYN) 500 MG tablet, Take 1 tablet (500 mg total) by mouth 2 (two) times daily. (Patient not taking: Reported on 03/17/2022), Disp: 30 tablet, Rfl: 0   ondansetron (ZOFRAN ODT) 4 MG disintegrating tablet, Take 1 tablet (4 mg total) by mouth every 8 (eight) hours as needed for nausea or vomiting. (Patient not taking: Reported on 03/17/2022), Disp: 20 tablet, Rfl: 0   ondansetron (ZOFRAN) 4 MG tablet, Take 1 tablet (4 mg total) by mouth every 6 (six) hours. (Patient not taking: Reported on 03/17/2022), Disp: 12 tablet, Rfl: 0  Observations/Objective: Patient is well-developed, well-nourished in no acute distress.  Resting comfortably  at home.  Head is normocephalic, atraumatic.  No labored breathing.  Speech is clear and coherent with logical content.  Patient is alert and oriented at baseline.    Assessment and Plan: 1. Vaginosis  - OBGYN follow-up if not improving.  Meds ordered this encounter  Medications   fluconazole (DIFLUCAN) 150 MG tablet    Sig: Take 1 tablet today and 1 tablet when you finish the antibiotic.    Dispense:  2 tablet    Refill:  0    Order Specific Question:   Supervising Provider    Answer:   Chase Picket [[id number]]   metroNIDAZOLE (METROGEL) 1 % gel    Sig: Apply topically daily for 7 days.    Dispense:  45 g    Refill:  0    Order Specific Question:   Supervising Provider    Answer:   Chase Picket [id number]     Follow Up Instructions: I discussed the assessment and treatment plan with the patient. The patient was provided an opportunity to ask questions and all were answered. The patient agreed with the plan and demonstrated an understanding of the instructions.  A copy of instructions were sent to the patient via MyChart unless otherwise noted below.     The patient was advised to call back or seek an in-person evaluation if the symptoms worsen or if the condition fails to improve as anticipated.  Time:  I spent 11  minutes with the patient via telehealth technology discussing the above problems/concerns.    Montine Circle, PA-C

## 2022-08-29 ENCOUNTER — Encounter (HOSPITAL_COMMUNITY): Payer: Self-pay

## 2022-08-29 ENCOUNTER — Ambulatory Visit (HOSPITAL_COMMUNITY)
Admission: EM | Admit: 2022-08-29 | Discharge: 2022-08-29 | Disposition: A | Discharge status: Home / Self Care | Payer: Medicaid Other | Attending: Family Medicine | Admitting: Family Medicine

## 2022-08-29 DIAGNOSIS — Z3A01 Less than 8 weeks gestation of pregnancy: Secondary | ICD-10-CM | POA: Diagnosis present

## 2022-08-29 DIAGNOSIS — Z3201 Encounter for pregnancy test, result positive: Secondary | ICD-10-CM

## 2022-08-29 DIAGNOSIS — N898 Other specified noninflammatory disorders of vagina: Secondary | ICD-10-CM

## 2022-08-29 LAB — POC URINE PREG, ED: Preg Test, Ur: POSITIVE — AB

## 2022-08-29 LAB — HIV ANTIBODY (ROUTINE TESTING W REFLEX): HIV Screen 4th Generation wRfx: NONREACTIVE

## 2022-08-29 MED ORDER — CLINDAMYCIN PHOSPHATE (1 DOSE) 2 % VA CREA: 1.0000 | TOPICAL_CREAM | Freq: Every day | VAGINAL | 0 refills | Status: AC

## 2022-08-29 NOTE — Discharge Instructions (Addendum)
You were seen today for vaginal discharge.  Your vaginal swab and lab work will be resulted tomorrow and you will be notified of results.  I have sent out a vaginal suppository for treatment of BV.   Your pregnancy test was POSITIVE.  Based on your last period you are [redacted] weeks pregnant.  Please avoid tobacco and alcohol use.  Avoid any drug use.  You may use tylenol for pain only.  Please follow up with an ob/gyn for further care.

## 2022-08-29 NOTE — ED Triage Notes (Signed)
Patient reports a white vaginal discharge x 2 days. Patient also reports "slight vaginal itching." Patient denies any urinary issues.  Patient states she used Monistat but caused the itching. 2 days ago.

## 2022-08-29 NOTE — ED Provider Notes (Addendum)
[place]    CSN: [id number] Arrival date & time: 08/29/22  1234      History   Chief Complaint Chief Complaint  Patient presents with   SEXUALLY TRANSMITTED DISEASE    HPI Emily Cain is a 27 y.o. female.   Patient is here for vaginal d/c, slight itchy.  No smell. White, milky d/c.  Going on x 2 days.  No urinary symptoms.  Sexually active, condom use PRN.  No known exposures to STDs.  LMP was 12/19.  She does have h/o yeast and BV.  Cannot tolerate oral flagyl.  Used metrogel in the past which worked, but felt a "tingling" sensation.           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 Authorizing Provider  azithromycin (ZITHROMAX) 250 MG tablet Take 1 tablet (250 mg total) by mouth daily. Take first 2 tablets together, then 1 every day until finished. Patient not taking: Reported on 03/17/2022 07/13/21   Elba Barman, DO  fluconazole (DIFLUCAN) 150 MG tablet Take 1 tablet today and 1 tablet when you finish the antibiotic. 05/24/22   Montine Circle, PA-C  gabapentin (NEURONTIN) 300 MG capsule Take 300 mg by mouth 3 (three) times daily. Patient not taking: Reported on 03/17/2022    [provider]  lidocaine (XYLOCAINE) 2 % solution Use as directed 15 mLs in the mouth or throat as needed for mouth pain. Patient not taking: Reported on 03/17/2022 08/20/21    Caccavale, Sophia, PA-C  medroxyPROGESTERone (PROVERA) 10 MG tablet Take 1 tablet (10 mg total) by mouth daily for 10 days. 05/31/20 06/10/20  Corena Herter, PA-C  metroNIDAZOLE (METROGEL VAGINAL) 0.75 % vaginal gel Place 1 Applicatorful vaginally at bedtime. 03/24/22   Griffin Basil, MD  naproxen (NAPROSYN) 500 MG tablet Take 1 tablet (500 mg total) by mouth 2 (two) times daily. Patient not taking: Reported on 03/17/2022 12/03/20   Pearson Forster, NP  ondansetron (ZOFRAN ODT) 4 MG disintegrating tablet Take 1 tablet (4 mg total) by mouth every 8 (eight) hours as needed for nausea or vomiting. Patient not taking: Reported on 03/17/2022 12/10/20   Vanessa Kick, MD  ondansetron (ZOFRAN) 4 MG tablet Take 1 tablet (4 mg total) by mouth every 6 (six) hours. Patient not taking: Reported on 03/17/2022 12/23/20   Sherrill Raring, PA-C  sertraline (ZOLOFT) 25 MG tablet Take 1 tablet (25 mg total) by mouth daily. Patient not taking: Reported on 01/21/2018 07/05/17 04/26/19  Luvenia Redden, PA-C    Family History Family History  Problem Relation  Age of Onset   Cancer Maternal Aunt    Cancer Maternal Uncle    Cancer Maternal Grandmother     Social History Social History   Tobacco Use   Smoking status: Former    Packs/day: 0.25    Types: Cigarettes   Smokeless tobacco: Never  Vaping Use   Vaping Use: Former  Substance Use Topics   Alcohol use: Yes   Drug use: No     Allergies   Methadone and Flagyl [metronidazole]   Review of Systems Review of Systems  Constitutional: Negative.   HENT: Negative.    Respiratory: Negative.    Cardiovascular: Negative.   Genitourinary:  Positive for vaginal discharge.     Physical Exam Triage Vital Signs ED Triage Vitals  Enc Vitals Group     BP 08/29/22 1450 131/83     Pulse Rate 08/29/22 1450 82     Resp 08/29/22 1450 16     Temp 08/29/22 1450 98.5 F (36.9 C)     Temp Source 08/29/22 1450 Oral     SpO2 08/29/22 1450 100 %     Weight --       Height --      Head Circumference --      Peak Flow --      Pain Score 08/29/22 1452 0     Pain Loc --      Pain Edu? --      Excl. in GC? --    No data found.  Updated Vital Signs BP 131/83 (BP Location: Right Arm)   Pulse 82   Temp 98.5 F (36.9 C) (Oral)   Resp 16   LMP 07/27/2022   SpO2 100%   Visual Acuity Right Eye Distance:   Left Eye Distance:   Bilateral Distance:    Right Eye Near:   Left Eye Near:    Bilateral Near:     Physical Exam Constitutional:      Appearance: Normal appearance.  Cardiovascular:     Rate and Rhythm: Normal rate.  Pulmonary:     Effort: Pulmonary effort is normal.  Skin:    General: Skin is warm.  Neurological:     General: No focal deficit present.     Mental Status: She is alert.  Psychiatric:        Mood and Affect: Mood normal.      UC Treatments / Results  Labs (all labs ordered are listed, but only abnormal results are displayed) Labs Reviewed  POC URINE PREG, ED - Abnormal; Notable for the following components:      Result Value   Preg Test, Ur POSITIVE (*)    All other components within normal limits  HIV ANTIBODY (ROUTINE TESTING W REFLEX)  RPR  CERVICOVAGINAL ANCILLARY ONLY    EKG   Radiology No results found.  Procedures Procedures (including critical care time)  Medications Ordered in UC Medications - No data to display  Initial Impression / Assessment and Plan / UC Course  I have reviewed the triage vital signs and the nursing notes.  Pertinent labs & imaging results that were available during my care of the patient were reviewed by me and considered in my medical decision making (see chart for details).   Final Clinical Impressions(s) / UC Diagnoses   Final diagnoses:  Vaginal discharge  Less than [redacted] weeks gestation of pregnancy     Discharge Instructions      You were seen today for vaginal discharge.  Your vaginal  swab and lab work will be resulted tomorrow and you will be notified  of results.  I have sent out a vaginal suppository for treatment of BV.   Your pregnancy test was POSITIVE.  Based on your last period you are [redacted] weeks pregnant.  Please avoid tobacco and alcohol use.  Avoid any drug use.  You may use tylenol for pain only.  Please follow up with an ob/gyn for further care.     ED Prescriptions     Medication Sig Dispense Auth. Provider   Clindamycin Phosphate, 1 Dose, vaginal cream Apply 1 Application topically at bedtime for 7 days. Insert PV qhs x 7 days 5.8 g Rondel Oh, MD      PDMP not reviewed this encounter.   Rondel Oh, MD 08/29/22 1535    Rondel Oh, MD 08/29/22 [phone]

## 2022-08-30 LAB — CERVICOVAGINAL ANCILLARY ONLY
Bacterial Vaginitis (gardnerella): POSITIVE — AB
Candida Glabrata: NEGATIVE
Candida Vaginitis: POSITIVE — AB
Chlamydia: NEGATIVE
Comment: NEGATIVE
Comment: NEGATIVE
Comment: NEGATIVE
Comment: NEGATIVE
Comment: NEGATIVE
Comment: NORMAL
Neisseria Gonorrhea: NEGATIVE
Trichomonas: NEGATIVE

## 2022-08-30 LAB — RPR: RPR Ser Ql: NONREACTIVE

## 2022-08-31 ENCOUNTER — Telehealth (HOSPITAL_COMMUNITY): Payer: Self-pay | Admitting: Emergency Medicine

## 2022-08-31 MED ORDER — CLOTRIMAZOLE 1 % VA CREA: 1.0000 | TOPICAL_CREAM | Freq: Every day | VAGINAL | 0 refills | Status: DC

## 2022-09-29 ENCOUNTER — Telehealth (INDEPENDENT_AMBULATORY_CARE_PROVIDER_SITE_OTHER): Payer: Medicaid Other

## 2022-09-29 DIAGNOSIS — O219 Vomiting of pregnancy, unspecified: Secondary | ICD-10-CM

## 2022-09-29 DIAGNOSIS — Z3689 Encounter for other specified antenatal screening: Secondary | ICD-10-CM

## 2022-09-29 DIAGNOSIS — O0990 Supervision of high risk pregnancy, unspecified, unspecified trimester: Secondary | ICD-10-CM

## 2022-09-29 MED ORDER — DOXYLAMINE-PYRIDOXINE 10-10 MG PO TBEC: 2.0000 | DELAYED_RELEASE_TABLET | Freq: Every day | ORAL | 1 refills | Status: DC

## 2022-09-29 MED ORDER — ALIVE PRENATAL 0.12-25 MG PO CHEW: 1.0000 | CHEWABLE_TABLET | Freq: Every day | ORAL | 11 refills | Status: DC

## 2022-09-29 MED ORDER — BLOOD PRESSURE KIT DEVI: 1.0000 | Freq: Once | 0 refills | Status: AC

## 2022-09-29 NOTE — Progress Notes (Signed)
New OB Intake  I connected with Emily Cain  on 09/29/22 at  2:15 PM EST by MyChart Video Visit and verified that I am speaking with the correct person using two identifiers. Nurse is located at [facility] and pt is located at home.  I discussed the limitations, risks, security and privacy concerns of performing an evaluation and management service by telephone and the availability of in person appointments. I also discussed with the patient that there may be a patient responsible charge related to this service. The patient expressed understanding and agreed to proceed.  I explained I am completing New OB Intake today. We discussed EDD of 05/03/23 that is based on LMP of 07/27/22. Pt is G4/P2011. I reviewed her allergies, medications, Medical/Surgical/OB history, and appropriate screenings. [facility] information placed in AVS. Based on history, this is a high risk pregnancy due to past IUFD.   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 Provider to assess at new OB.  Delivery Plans Plans to deliver at [facility]. Patient given information for [facility] Healthy Baby website for more information about Women's and [place].  MyChart/Babyscripts MyChart access verified. I explained pt will have some visits in office and some virtually. Babyscripts instructions given and order placed.   Blood Pressure Cuff Blood pressure cuff ordered for patient to pick-up from [employer]. Explained after first prenatal appt pt will check weekly and document in 69.  Anatomy US Explained first scheduled [place] will be around 19 weeks. Anatomy US scheduled for  12/07/22.  Labs Discussed Johnsie Cancel genetic screening with patient. Would like both Panorama and Horizon drawn at new OB visit. Routine prenatal labs needed.  COVID Vaccine Patient has had COVID vaccine.   Is patient a CenteringPregnancy candidate?  Declined Declined due to Group setting  Is patient a Mom+Baby Combined Care candidate?  Not a candidate    Social Determinants of Health Food Insecurity: Patient denies food insecurity. Transportation: Patient denies transportation needs.  First visit review I reviewed new OB appt with patient. I explained they will have a provider visit that includes physical exam, Pap smear, and labs. Explained pt will be seen by Manya Silvas, CNM at first visit; encounter routed to appropriate provider. Explained that patient will be seen by pregnancy navigator following visit with provider.   Annabell Howells, RN 09/29/2022  4:17 PM

## 2022-09-29 NOTE — Patient Instructions (Signed)
Behavioral Health At our [facility] OB/GYN Practices, we work as an integrated team, providing care to address both physical and emotional health. Your medical provider may refer you to see our [facility]) ;scheduled virtually at your convenience.  Our [facility] is available to all patients, visits generally last between 20-30 minutes, but can be longer or shorter, depending on patient need. The [facility] offers help with stress management, coping with symptoms of depression and anxiety, major life changes , sleep issues, changing risky behavior, grief and loss, life stress, working on personal life goals, and  behavioral health issues, as these all affect your overall health and wellness.  The [facility] is NOT available for the following: FMLA paperwork, court-ordered evaluations, specialty assessments (custody or disability), letters to employers, or obtaining certification for an emotional support animal. The [facility] does not generally provide long-term therapy. You have the right to refuse integrated behavioral health services, or to reschedule to see the [facility] at a later date.  Confidentiality exception: If it is suspected that a child or disabled adult is being abused or neglected, we are required by law to report that to either Child Protective Services or Adult Scientist, forensic.  If you have a diagnosis of Bipolar affective disorder, Schizophrenia, or recurrent Major depressive disorder, we will recommend that you establish care with a psychiatrist, as these are lifelong, chronic conditions, and we want your overall emotional health and medications to be more closely monitored. If you anticipate needing extended maternity leave due to behavioral health issues postpartum, it it recommended you inform your medical provider, so we can put in a referral to a psychiatrist as soon as possible. The [facility] is unable to recommend an extended maternity leave for behavioral health issues. Your medical provider or [facility]  may refer you to a therapist for ongoing, traditional therapy, or to a psychiatrist, for medication management, if it would benefit your overall health. Depending on your insurance, you may have a copay or be charged a deductible, depending on your insurance, to see the [facility]. If you are uninsured, it is recommended that you apply for financial assistance. (Forms may be requested at the front desk for in-person visits, via MyChart, or request a form during a virtual visit).  If you see the [facility] more than 6 times, you will have to complete a comprehensive clinical assessment interview with the [facility] to resume integrated services.  For virtual visits with the [facility], you must be physically in the state of [place] at the time of the visit. For example, if you live in [place], you will have to do an in-person visit with the [facility], and your out-of-state insurance may not cover behavioral health services in [place]. If you are going out of the state or country for any reason, the [facility] may see you virtually when you return to [place], but not while you are physically outside of [place].

## 2022-10-07 ENCOUNTER — Other Ambulatory Visit: Payer: Self-pay

## 2022-10-07 ENCOUNTER — Other Ambulatory Visit (HOSPITAL_COMMUNITY)
Admission: RE | Admit: 2022-10-07 | Discharge: 2022-10-07 | Disposition: A | Discharge status: Home / Self Care | Payer: Medicaid Other | Source: Ambulatory Visit | Attending: Advanced Practice Midwife | Admitting: Advanced Practice Midwife

## 2022-10-07 ENCOUNTER — Ambulatory Visit (INDEPENDENT_AMBULATORY_CARE_PROVIDER_SITE_OTHER): Payer: Medicaid Other | Admitting: Advanced Practice Midwife

## 2022-10-07 VITALS — BP 129/73 | HR 82 | Wt 207.0 lb

## 2022-10-07 DIAGNOSIS — Z3A10 10 weeks gestation of pregnancy: Secondary | ICD-10-CM | POA: Diagnosis not present

## 2022-10-07 DIAGNOSIS — O0990 Supervision of high risk pregnancy, unspecified, unspecified trimester: Secondary | ICD-10-CM | POA: Insufficient documentation

## 2022-10-07 DIAGNOSIS — O0991 Supervision of high risk pregnancy, unspecified, first trimester: Secondary | ICD-10-CM | POA: Diagnosis not present

## 2022-10-07 MED ORDER — ASPIRIN 81 MG PO TBEC: 81.0000 mg | DELAYED_RELEASE_TABLET | Freq: Every day | ORAL | 2 refills | Status: DC

## 2022-10-07 MED ORDER — BLOOD PRESSURE KIT DEVI: 1.0000 | Freq: Once | 0 refills | Status: AC

## 2022-10-07 MED ORDER — GOJJI WEIGHT SCALE MISC: 1.0000 | Freq: Every day | 0 refills | Status: DC

## 2022-10-07 NOTE — Progress Notes (Signed)
Informal bedside ultrasound performed to assess FHR, FHR 172bpm

## 2022-10-07 NOTE — Progress Notes (Signed)
INITIAL PRENATAL VISIT  Subjective:   Emily Cain is 27 y.o. [phone] female being seen today for her first obstetrical visit. She has not received other prenatal care previously this pregnancy. This is not a planned pregnancy. This is a desired pregnancy.  She is at 37w2dgestation by LMP.  Her obstetrical history is significant for  IUFD , unknown cause. Relationship with FOB: significant other, not living together. Patient does intend to breast feed. Pregnancy history fully reviewed.  Review of Systems:   ROS nausea, no bleeding, no cramping, and no leaking.  Objective:    Obstetric History OB History  Gravida Para Term Preterm AB Living  '4 2 2 '$ 0 1 1  SAB IAB Ectopic Multiple Live Births  0 1 0 0 1    # Outcome Date GA Lbr Len/2nd Weight Sex Delivery Anes PTL Lv  4 Current           3 IAB 2019          2 Term 06/06/17 352w1d 00:11 6 lb 8.1 oz (2.95 kg) F Vag-Spont EPI  LIV  1 Term 05/22/16 3778w3d:46 / 02:13 8 lb 0.6 oz (3.645 kg) M Vag-Spont EPI  FD    Past Medical History:  Diagnosis Date   Anemia    Environmental allergies    History of IUFD     Past Surgical History:  Procedure Laterality Date   NO PAST SURGERIES      Current Outpatient Medications on File Prior to Visit  Medication Sig Dispense Refill   Prenatal MV & Min w/FA-DHA (ALIVE PRENATAL) 0.12-25 MG CHEW Chew 1 Dose by mouth daily. 30 tablet 11   [DISCONTINUED] sertraline (ZOLOFT) 25 MG tablet Take 1 tablet (25 mg total) by mouth daily. (Patient not taking: Reported on 01/21/2018) 30 tablet 1   No current facility-administered medications on file prior to visit.    Allergies  Allergen Reactions   Methadone Hives   Flagyl [Metronidazole] Nausea And Vomiting    Social History:  reports that she has quit smoking. Her smoking use included cigarettes. She smoked an average of .25 packs per day. She has never used smokeless tobacco. She reports that she does not currently use alcohol. She reports  that she does not use drugs.  Family History  Problem Relation Age of Onset   Cancer Maternal Aunt    Cancer Maternal Grandmother     The following portions of the patient's history were reviewed and updated as appropriate: allergies, current medications, past family history, past medical history, past social history, past surgical history and problem list.  Physical Exam:  BP 129/73   Pulse 82   Wt 207 lb (93.9 kg)   LMP 07/27/2022   BMI 31.47 kg/m  CONSTITUTIONAL: Well-developed, well-nourished female in no acute distress.  HENT:  Normocephalic, atraumatic. Oropharynx is clear and moist EYES: Conjunctivae normal. No scleral icterus.  SKIN: Skin is warm and dry. No rash noted. Not diaphoretic. No erythema. No pallor. MUSCULOSKELETAL: Normal range of motion. No tenderness.  No cyanosis, clubbing, or edema.   NEUROLOGIC: Alert and oriented to person, place, and time. Normal muscle tone coordination.  PSYCHIATRIC: Normal mood and affect. Normal behavior. Normal judgment and thought content. CARDIOVASCULAR: Normal heart rate noted. RESPIRATORY: Effort and rate normal. BREASTS: Declined ABDOMEN: Soft, no distention, tenderness, rebound or guarding. Fundal ht: fundus non-palpable  PELVIC: Normal appearing external genitalia; normal appearing vaginal mucosa and cervix.  No abnormal discharge noted.  Pap smear not  obtained.  Uterus S=D, no other palpable masses, no uterine or adnexal tenderness. Fetal Status: Fetal Heart Rate (bpm): 172   Movement: Absent     Indications for ASA therapy (per uptodate) Two or more of the following: Obesity (body mass index >30 kg/m2) Yes Sociodemographic characteristics (African American race, low socioeconomic level) Yes Personal risk factors (eg, previous pregnancy with low birth weight or small for gestational age infant, previous adverse pregnancy outcome [eg, stillbirth], interval >10 years between pregnancies) Yes  Indications for early GDM  screening  BMI >30kg/m2 Yes High-risk race/ethnicity (eg, African American, Latino, Native American, Asian American, [place]) Yes Previous stillbirth of unknown cause Yes Other clinical condition associated with insulin resistance (eg, severe obesity, acanthosis nigricans) Yes  Early screening tests: FBS, A1C, Random CBG, glucose challenge   Assessment:   Pregnancy: G4P2011 1. Supervision of high risk pregnancy, antepartum  - Blood Pressure Monitoring (BLOOD PRESSURE KIT) DEVI; 1 Device by Does not apply route once for 1 dose.  Dispense: 1 each; Refill: 0 - Misc. Devices (GOJJI WEIGHT SCALE) MISC; 1 Device by Does not apply route daily.  Dispense: 1 each; Refill: 0 - Culture, OB Urine - GC/Chlamydia probe amp ()not at [facility] - CBC/D/Plt+RPR+Rh+ABO+RubIgG... - Hemoglobin A1c  2. Hx IUFD - Antenatal testing per MFM - MFM consult last pregnancy. Repeat not indicated.     Plan:  Initial labs drawn/ Prenatal vitamins. Rx ASA for reduction of risk for preeclampsia.  Problem list reviewed and updated. Genetic screening discussed: NIPS/First trimester screen/Quad/AFP undecided. Role of ultrasound in pregnancy discussed; Anatomy US: requested. Amniocentesis discussed: not indicated. Follow up in 4 weeks. Cnsidering Centering Pregnancy  Discussed clinic routines, schedule of care and testing, genetic screening options, involvement of students and residents under the direct supervision of APPs and doctors and presence of female providers. Pt verbalized understanding.  [place], CNM 10/07/2022 10:14 AM

## 2022-10-07 NOTE — Patient Instructions (Signed)
  CenteringPregnancy is a model of prenatal care that started 30 years ago and is used in about 600 practices around the [place]. You meet with a group of 8-12 women due around the same time as you. In Centering you will have individual time with the provider and meet as a group. There's much more time for discussion and learning. You will actually have much more time with your provider in Centering than in traditional prenatal care.? You will come directly into the Centering room and will not wait in the lobby so there is no wasted time. You will have 2-hour visits every 4 weeks then every 2 weeks. You will know your Centering prenatal appointments in advance. In your last month of pregnancy, you may also come in for some individual visits. Additional appointments can be scheduled if you need more care. Studies have shown that CenteringPregnancy improves birth outcomes. We have seen especially big improvements in fewer Black women delivering babies who are too small or born too early. Visit the website [facility] for more information. Let your provider or clinic staff know if you want to sign up.    

## 2022-10-08 ENCOUNTER — Encounter: Payer: Self-pay | Admitting: *Deleted

## 2022-10-08 LAB — CBC/D/PLT+RPR+RH+ABO+RUBIGG...
Antibody Screen: NEGATIVE
Basophils Absolute: 0 10*3/uL (ref 0.0–0.2)
Basos: 1 %
EOS (ABSOLUTE): 0 10*3/uL (ref 0.0–0.4)
Eos: 1 %
HCV Ab: NONREACTIVE
HIV Screen 4th Generation wRfx: NONREACTIVE
Hematocrit: 36.6 % (ref 34.0–46.6)
Hemoglobin: 12.5 g/dL (ref 11.1–15.9)
Hepatitis B Surface Ag: NEGATIVE
Immature Grans (Abs): 0 10*3/uL (ref 0.0–0.1)
Immature Granulocytes: 0 %
Lymphocytes Absolute: 2 10*3/uL (ref 0.7–3.1)
Lymphs: 48 %
MCH: 27.7 pg (ref 26.6–33.0)
MCHC: 34.2 g/dL (ref 31.5–35.7)
MCV: 81 fL (ref 79–97)
Monocytes Absolute: 0.2 10*3/uL (ref 0.1–0.9)
Monocytes: 5 %
Neutrophils Absolute: 1.8 10*3/uL (ref 1.4–7.0)
Neutrophils: 45 %
Platelets: 201 10*3/uL (ref 150–450)
RBC: 4.52 x10E6/uL (ref 3.77–5.28)
RDW: 14.3 % (ref 11.7–15.4)
RPR Ser Ql: NONREACTIVE
Rh Factor: POSITIVE
Rubella Antibodies, IGG: 1.49 index (ref >0.99)
WBC: 4.1 10*3/uL (ref 3.4–10.8)

## 2022-10-08 LAB — HEMOGLOBIN A1C
Est. average glucose Bld gHb Est-mCnc: 120 mg/dL
Hgb A1c MFr Bld: 5.8 % — ABNORMAL HIGH (ref 4.8–5.6)

## 2022-10-08 LAB — HCV INTERPRETATION

## 2022-10-08 LAB — GC/CHLAMYDIA PROBE AMP (~~LOC~~) NOT AT ARMC
Chlamydia: NEGATIVE
Comment: NEGATIVE
Comment: NORMAL
Neisseria Gonorrhea: NEGATIVE

## 2022-10-09 LAB — CULTURE, OB URINE

## 2022-10-09 LAB — URINE CULTURE, OB REFLEX

## 2022-10-11 ENCOUNTER — Encounter: Payer: Self-pay | Admitting: Advanced Practice Midwife

## 2022-10-11 DIAGNOSIS — B9689 Other specified bacterial agents as the cause of diseases classified elsewhere: Secondary | ICD-10-CM

## 2022-10-11 DIAGNOSIS — B379 Candidiasis, unspecified: Secondary | ICD-10-CM

## 2022-10-12 MED ORDER — METRONIDAZOLE 0.75 % EX GEL: 1.0000 | Freq: Two times a day (BID) | CUTANEOUS | 0 refills | Status: DC

## 2022-10-12 MED ORDER — TERCONAZOLE 0.4 % VA CREA: 1.0000 | TOPICAL_CREAM | Freq: Every day | VAGINAL | 0 refills | Status: DC

## 2022-10-18 ENCOUNTER — Inpatient Hospital Stay (HOSPITAL_COMMUNITY)
Admission: AD | Admit: 2022-10-18 | Discharge: 2022-10-18 | Disposition: A | Discharge status: Home / Self Care | Payer: Medicaid Other | Attending: Family Medicine | Admitting: Family Medicine

## 2022-10-18 DIAGNOSIS — Z3491 Encounter for supervision of normal pregnancy, unspecified, first trimester: Secondary | ICD-10-CM

## 2022-10-18 DIAGNOSIS — O208 Other hemorrhage in early pregnancy: Secondary | ICD-10-CM | POA: Insufficient documentation

## 2022-10-18 DIAGNOSIS — Z711 Person with feared health complaint in whom no diagnosis is made: Secondary | ICD-10-CM

## 2022-10-18 DIAGNOSIS — Z3A11 11 weeks gestation of pregnancy: Secondary | ICD-10-CM | POA: Diagnosis not present

## 2022-10-18 DIAGNOSIS — O26891 Other specified pregnancy related conditions, first trimester: Secondary | ICD-10-CM | POA: Diagnosis present

## 2022-10-18 DIAGNOSIS — N930 Postcoital and contact bleeding: Secondary | ICD-10-CM

## 2022-10-18 NOTE — MAU Note (Signed)
.  Emily Cain is a 27 y.o. at [redacted]w[redacted]d here in MAU reporting: had intercourse this morning and started to have spotting after that. Denies heavy bleeding or abnormal discharge. Also denies pain.   Pain score: 0 Vitals:   10/18/22 1603  BP: 128/71  Pulse: 82  Resp: 14  Temp: 98.5 F (36.9 C)  SpO2: 99%     MCN:[id number]

## 2022-10-18 NOTE — MAU Provider Note (Signed)
Event Date/Time   First Provider Initiated Contact with Patient 10/18/22 1615      S Ms. Emily Cain is a 27 y.o. [phone] patient who presents to MAU today with complaint of spotting after intercourse. She reports she had intercourse this morning and saw spotting on the tissue after she wiped. She has had no further bleeding. She denies any pain.    O BP 128/71 (BP Location: Right Arm)   Pulse 82   Temp 98.5 F (36.9 C) (Oral)   Resp 14   Ht 5' 7.5" (1.715 m)   Wt 95.3 kg   LMP 07/27/2022   SpO2 99%   BMI 32.41 kg/m  Physical Exam Vitals and nursing note reviewed.  Constitutional:      General: She is not in acute distress.    Appearance: She is well-developed.  HENT:     Head: Normocephalic.  Eyes:     Pupils: Pupils are equal, round, and reactive to light.  Cardiovascular:     Rate and Rhythm: Normal rate and regular rhythm.     Heart sounds: Normal heart sounds.  Pulmonary:     Effort: Pulmonary effort is normal. No respiratory distress.     Breath sounds: Normal breath sounds.  Abdominal:     General: Bowel sounds are normal. There is no distension.     Palpations: Abdomen is soft.     Tenderness: There is no abdominal tenderness.  Skin:    General: Skin is warm and dry.  Neurological:     Mental Status: She is alert and oriented to person, place, and time.  Psychiatric:        Mood and Affect: Mood normal.        Behavior: Behavior normal.        Thought Content: Thought content normal.        Judgment: Judgment normal.      Pt informed that the ultrasound is considered a limited OB ultrasound and is not intended to be a complete ultrasound exam.  Patient also informed that the ultrasound is not being completed with the intent of assessing for fetal or placental anomalies or any pelvic abnormalities.  Explained that the purpose of today's ultrasound is to assess for  viability.  Patient acknowledges the purpose of the exam and the limitations of the study.     Live IUP consistent with dates with FHR 143 bpm  A Medical screening exam complete 1. Physically well but worried   2. [redacted] weeks gestation of pregnancy   3. PCB (post coital bleeding)   4. Presence of fetal heart sounds in first trimester      P -Discharge home in stable condition -Pelvic rest precautions discussed -Patient advised to follow-up with OB as scheduled for prenatal care -Patient may return to MAU as needed or if her condition were to change or worsen  Wende Mott, [place] 10/18/2022 4:16 PM

## 2022-10-18 NOTE — Discharge Instructions (Signed)

## 2022-10-28 ENCOUNTER — Inpatient Hospital Stay (HOSPITAL_COMMUNITY)
Admission: AD | Admit: 2022-10-28 | Discharge: 2022-10-28 | Disposition: A | Discharge status: Home / Self Care | Payer: Medicaid Other | Attending: Obstetrics & Gynecology | Admitting: Obstetrics & Gynecology

## 2022-10-28 DIAGNOSIS — Z3A13 13 weeks gestation of pregnancy: Secondary | ICD-10-CM | POA: Diagnosis not present

## 2022-10-28 DIAGNOSIS — O26891 Other specified pregnancy related conditions, first trimester: Secondary | ICD-10-CM | POA: Diagnosis not present

## 2022-10-28 DIAGNOSIS — R109 Unspecified abdominal pain: Secondary | ICD-10-CM

## 2022-10-28 DIAGNOSIS — O09291 Supervision of pregnancy with other poor reproductive or obstetric history, first trimester: Secondary | ICD-10-CM | POA: Diagnosis not present

## 2022-10-28 DIAGNOSIS — R102 Pelvic and perineal pain: Secondary | ICD-10-CM | POA: Insufficient documentation

## 2022-10-28 LAB — URINALYSIS, ROUTINE W REFLEX MICROSCOPIC
Bilirubin Urine: NEGATIVE
Glucose, UA: NEGATIVE mg/dL
Hgb urine dipstick: NEGATIVE
Ketones, ur: NEGATIVE mg/dL
Nitrite: NEGATIVE
Protein, ur: NEGATIVE mg/dL
Specific Gravity, Urine: 1.021 (ref 1.005–1.030)
pH: 6 (ref 5.0–8.0)

## 2022-10-28 LAB — WET PREP, GENITAL
Clue Cells Wet Prep HPF POC: NONE SEEN
Sperm: NONE SEEN
Trich, Wet Prep: NONE SEEN
WBC, Wet Prep HPF POC: >=10 — AB (ref <10)
Yeast Wet Prep HPF POC: NONE SEEN

## 2022-10-28 NOTE — MAU Note (Signed)
Emily Cain is a 28 y.o. at [redacted]w[redacted]d here in MAU reporting: only tender when touched in suprapubic region, otherwise not having pain.  No bleeding, has small amt of white d/c  no constipation, stools are loose. No urinary problems.  Onset of complaint: yesterday Pain score: 7 Vitals:   10/28/22 1643  BP: 120/69  Pulse: 73  Resp: 17  Temp: 98.4 F (36.9 C)  SpO2: 100%     FHT:156 Lab orders placed from triage:  urine sent

## 2022-10-28 NOTE — MAU Provider Note (Signed)
Chief Complaint: Pelvic Pain (Only when touched)   None     SUBJECTIVE HPI: Emily Cain is a 27 y.o. [id number] at [redacted]w[redacted]d by LMP who presents to maternity admissions reporting suprapubic tenderness, but only when she presses on her abdomen. She denies any dysuria or other urinary symptoms, and denies vaginal itching/burning/odor.  She had bleeding after intercourse a couple of weeks ago and was seen in MAU and was worried when she had the pain today.  She denies any bleeding since her MAU visit on 10/18/22.   HPI  Past Medical History:  Diagnosis Date   Anemia    Environmental allergies    History of IUFD    Past Surgical History:  Procedure Laterality Date   NO PAST SURGERIES     Social History   Socioeconomic History   Marital status: Single    Spouse name: Not on file   Number of children: Not on file   Years of education: Not on file   Highest education level: GED or equivalent  Occupational History   Not on file  Tobacco Use   Smoking status: Former    Packs/day: .25    Types: Cigarettes   Smokeless tobacco: Never  Vaping Use   Vaping Use: Former  Substance and Sexual Activity   Alcohol use: Not Currently    Comment: wine occasionally   Drug use: No   Sexual activity: Yes    Birth control/protection: None  Other Topics Concern   Not on file  Social History Narrative   Not on file   Social Determinants of Health   Financial Resource Strain: Low Risk  (10/07/2022)   Overall Financial Resource Strain (CARDIA)    Difficulty of Paying Living Expenses: Not hard at all  Food Insecurity: No Food Insecurity (10/07/2022)   Hunger Vital Sign    Worried About Running Out of Food in the Last Year: Never true    [place] in the Last Year: Never true  Transportation Needs: No Transportation Needs (10/07/2022)   PRAPARE - Hydrologist (Medical): No    Lack of Transportation (Non-Medical): No  Physical Activity: Insufficiently Active  (10/07/2022)   Exercise Vital Sign    Days of Exercise per Week: 2 days    Minutes of Exercise per Session: 20 min  Stress: No Stress Concern Present (10/07/2022)   [place]    Feeling of Stress : Not at all  Social Connections: Unknown (10/07/2022)   Social Connection and Isolation Panel [NHANES]    Frequency of Communication with Friends and Family: Twice a week    Frequency of Social Gatherings with Friends and Family: Twice a week    Attends Religious Services: 1 to 4 times per year    Active Member of [employer] or Organizations: No    Attends Music therapist: Not on file    Marital Status: Patient declined  Intimate Partner Violence: Not on file   No current facility-administered medications on file prior to encounter.   Current Outpatient Medications on File Prior to Encounter  Medication Sig Dispense Refill   aspirin EC 81 MG tablet Take 1 tablet (81 mg total) by mouth daily. Take after 12 weeks for prevention of preeclampssia later in pregnancy 300 tablet 2   metroNIDAZOLE (METROGEL) 0.75 % gel Apply 1 Application topically 2 (two) times daily. 45 g 0   Misc. Devices (GOJJI WEIGHT SCALE) MISC 1 Device  by Does not apply route daily. 1 each 0   Prenatal MV & Min w/FA-DHA (ALIVE PRENATAL) 0.12-25 MG CHEW Chew 1 Dose by mouth daily. 30 tablet 11   terconazole (TERAZOL 7) 0.4 % vaginal cream Place 1 applicator vaginally at bedtime. 45 g 0   [DISCONTINUED] sertraline (ZOLOFT) 25 MG tablet Take 1 tablet (25 mg total) by mouth daily. (Patient not taking: Reported on 01/21/2018) 30 tablet 1   Allergies  Allergen Reactions   Methadone Hives   Flagyl [Metronidazole] Nausea And Vomiting    ROS:  Review of Systems   I have reviewed patient's Past Medical Hx, Surgical Hx, Family Hx, Social Hx, medications and allergies.   Physical Exam  Patient Vitals for the past 24 hrs:  BP Temp Temp src Pulse Resp SpO2  Height Weight  10/28/22 1643 120/69 98.4 F (36.9 C) Oral 73 17 100 % 5' 7.5" (1.715 m) 92.9 kg   Constitutional: Well-developed, well-nourished female in no acute distress.  Cardiovascular: normal rate Respiratory: normal effort GI: Abd soft, non-tender. Pos BS x 4 MS: Extremities nontender, no edema, normal ROM Neurologic: Alert and oriented x 4.  GU: Neg CVAT.  PELVIC EXAM: Deferred  FHT 156 by doppler  LAB RESULTS Results for orders placed or performed during the hospital encounter of 10/28/22 (from the past 24 hour(s))  Urinalysis, Routine w reflex microscopic -Urine, Clean Catch     Status: Abnormal   Collection Time: 10/28/22  3:41 PM  Result Value Ref Range   Color, Urine YELLOW YELLOW   APPearance CLEAR CLEAR   Specific Gravity, Urine 1.021 1.005 - 1.030   pH 6.0 5.0 - 8.0   Glucose, UA NEGATIVE NEGATIVE mg/dL   Hgb urine dipstick NEGATIVE NEGATIVE   Bilirubin Urine NEGATIVE NEGATIVE   Ketones, ur NEGATIVE NEGATIVE mg/dL   Protein, ur NEGATIVE NEGATIVE mg/dL   Nitrite NEGATIVE NEGATIVE   Leukocytes,Ua SMALL (A) NEGATIVE   RBC / HPF 0-5 0 - 5 RBC/hpf   WBC, UA 0-5 0 - 5 WBC/hpf   Bacteria, UA RARE (A) NONE SEEN   Squamous Epithelial / HPF 0-5 0 - 5 /HPF   Mucus PRESENT   Wet prep, genital     Status: Abnormal   Collection Time: 10/28/22  4:56 PM  Result Value Ref Range   Yeast Wet Prep HPF POC NONE SEEN NONE SEEN   Trich, Wet Prep NONE SEEN NONE SEEN   Clue Cells Wet Prep HPF POC NONE SEEN NONE SEEN   WBC, Wet Prep HPF POC >=10 (A) <10   Sperm NONE SEEN     B/Positive/-- (02/29 1053)  IMAGING No results found.  MAU Management/MDM: Orders Placed This Encounter  Procedures   Wet prep, genital   Culture, OB Urine   Urinalysis, Routine w reflex microscopic -Urine, Clean Catch   Discharge patient    No orders of the defined types were placed in this encounter.   UA wnl, FHT normal in MAU today.  No acute findings.  May be pelvic tenderness with uterine  growth, but urine sent for culture for further eval.  Warning signs/reasons to return reviewed.  Return to MAU as needed for emergencies. Keep scheduled OB appt on 11/08/22.  ASSESSMENT 1. Suprapubic pain, acute   2. Abdominal pain during pregnancy in first trimester   3. [redacted] weeks gestation of pregnancy     PLAN Discharge home Allergies as of 10/28/2022       Reactions   Methadone Hives  Flagyl [metronidazole] Nausea And Vomiting        Medication List     TAKE these medications    Alive Prenatal 0.12-25 MG Chew Chew 1 Dose by mouth daily.   aspirin EC 81 MG tablet Take 1 tablet (81 mg total) by mouth daily. Take after 12 weeks for prevention of preeclampssia later in pregnancy   Gojji Weight Scale Misc 1 Device by Does not apply route daily.   metroNIDAZOLE 0.75 % gel Commonly known as: METROGEL Apply 1 Application topically 2 (two) times daily.   terconazole 0.4 % vaginal cream Commonly known as: TERAZOL 7 Place 1 applicator vaginally at bedtime.         Fatima Blank Certified Nurse-Midwife 10/28/2022  6:54 PM

## 2022-10-29 LAB — GC/CHLAMYDIA PROBE AMP (~~LOC~~) NOT AT ARMC
Chlamydia: NEGATIVE
Comment: NEGATIVE
Comment: NORMAL
Neisseria Gonorrhea: NEGATIVE

## 2022-10-30 LAB — CULTURE, OB URINE: Culture: NO GROWTH

## 2022-11-08 ENCOUNTER — Ambulatory Visit (INDEPENDENT_AMBULATORY_CARE_PROVIDER_SITE_OTHER): Payer: Medicaid Other | Admitting: Obstetrics & Gynecology

## 2022-11-08 VITALS — BP 128/82 | HR 102 | Wt 206.9 lb

## 2022-11-08 DIAGNOSIS — O0990 Supervision of high risk pregnancy, unspecified, unspecified trimester: Secondary | ICD-10-CM

## 2022-11-08 DIAGNOSIS — Z3A14 14 weeks gestation of pregnancy: Secondary | ICD-10-CM

## 2022-11-08 DIAGNOSIS — Z8759 Personal history of other complications of pregnancy, childbirth and the puerperium: Secondary | ICD-10-CM

## 2022-11-08 DIAGNOSIS — O0992 Supervision of high risk pregnancy, unspecified, second trimester: Secondary | ICD-10-CM

## 2022-11-08 NOTE — Patient Instructions (Signed)
[facility] [address], , [place] [address] [phone] Hours: Sunday Closed Monday 9AM-6PM Tuesday 9AM-6PM Wednesday 9AM-6PM Thursday 9AM-6PM Friday           9AM-6PM Saturday         10AM-1PM  

## 2022-11-08 NOTE — Progress Notes (Unsigned)
   PRENATAL VISIT NOTE  Subjective:  Emily Cain is a 27 y.o. G4P2011 at [redacted]w[redacted]d being seen today for ongoing prenatal care.  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  Patient reports no complaints.  Contractions: Not present. Vag. Bleeding: None.  Movement: Absent. Denies leaking of fluid.   The following portions of the patient's history were reviewed and updated as appropriate: allergies, current medications, past family history, past medical history, past social history, past surgical history and problem list.   Objective:   Vitals:   11/08/22 1145  BP: 128/82  Pulse: (!) 102  Weight: 206 lb 14.4 oz (93.8 kg)    Fetal Status: Fetal Heart Rate (bpm): 154   Movement: Absent     General:  Alert, oriented and cooperative. Patient is in no acute distress.  Skin: Skin is warm and dry. No rash noted.   Cardiovascular: Normal heart rate noted  Respiratory: Normal respiratory effort, no problems with respiration noted  Abdomen: Soft, gravid, appropriate for gestational age.  Pain/Pressure: Absent     Pelvic: Cervical exam deferred        Extremities: Normal range of motion.  Edema: None  Mental Status: Normal mood and affect. Normal behavior. Normal judgment and thought content.   Assessment and Plan:  Pregnancy: [id number] at [redacted]w[redacted]d 1. History of IUFD At 37 weeks  2. Supervision of high risk pregnancy, antepartum [place] at 18 weeks. Declines genetic testing  Preterm labor symptoms and general obstetric precautions including but not limited to vaginal bleeding, contractions, leaking of fluid and fetal movement were reviewed in detail with the patient. Please refer to After Visit Summary for other counseling recommendations.   Return in about 4 weeks (around 12/06/2022).  Future Appointments  Date  Time Provider [place]  12/07/2022  9:30 AM [facility] NURSE [facility]  12/07/2022  9:45 AM [facility] US4 [facility] [place]    Emeterio Reeve, MD

## 2022-12-03 DIAGNOSIS — R7309 Other abnormal glucose: Secondary | ICD-10-CM | POA: Insufficient documentation

## 2022-12-03 DIAGNOSIS — O99210 Obesity complicating pregnancy, unspecified trimester: Secondary | ICD-10-CM | POA: Insufficient documentation

## 2022-12-06 ENCOUNTER — Ambulatory Visit (INDEPENDENT_AMBULATORY_CARE_PROVIDER_SITE_OTHER): Payer: Medicaid Other | Admitting: Obstetrics & Gynecology

## 2022-12-06 ENCOUNTER — Other Ambulatory Visit: Payer: Self-pay

## 2022-12-06 VITALS — BP 130/68 | HR 93 | Wt 207.4 lb

## 2022-12-06 DIAGNOSIS — Z8759 Personal history of other complications of pregnancy, childbirth and the puerperium: Secondary | ICD-10-CM

## 2022-12-06 DIAGNOSIS — R7309 Other abnormal glucose: Secondary | ICD-10-CM

## 2022-12-06 DIAGNOSIS — O99212 Obesity complicating pregnancy, second trimester: Secondary | ICD-10-CM

## 2022-12-06 DIAGNOSIS — Z3A18 18 weeks gestation of pregnancy: Secondary | ICD-10-CM

## 2022-12-06 DIAGNOSIS — O0992 Supervision of high risk pregnancy, unspecified, second trimester: Secondary | ICD-10-CM

## 2022-12-06 DIAGNOSIS — O0990 Supervision of high risk pregnancy, unspecified, unspecified trimester: Secondary | ICD-10-CM

## 2022-12-06 DIAGNOSIS — R894 Abnormal immunological findings in specimens from other organs, systems and tissues: Secondary | ICD-10-CM

## 2022-12-06 NOTE — Progress Notes (Signed)
   PRENATAL VISIT NOTE  Subjective:  Emily Cain is a 27 y.o. G4P2011 at [redacted]w[redacted]d being seen today for ongoing prenatal care.  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  Patient reports no complaints.   . Vag. Bleeding: None.  Movement: Present. Denies leaking of fluid.   The following portions of the patient's history were reviewed and updated as appropriate: allergies, current medications, past family history, past medical history, past social history, past surgical history and problem list.   Objective:   Vitals:   12/06/22 1534  BP: 130/68  Pulse: 93  Weight: 207 lb 7 oz (94.1 kg)    Fetal Status: Fetal Heart Rate (bpm): 147   Movement: Present     General:  Alert, oriented and cooperative. Patient is in no acute distress.  Skin: Skin is warm and dry. No rash noted.   Cardiovascular: Normal heart rate noted  Respiratory: Normal respiratory effort, no problems with respiration noted  Abdomen: Soft, gravid, appropriate for gestational age. Fundal height palpated 1cm below level of the umbilicus. Pain/Pressure: Present     Pelvic: Cervical exam deferred        Extremities: Normal range of motion.  Edema: None  Mental Status: Normal mood and affect. Normal behavior. Normal judgment and thought content.   Assessment and Plan:  Pregnancy: G4P2011 at [redacted]w[redacted]d  1. History of IUFD -At 37 weeks previously  2. Supervision of high risk pregnancy, antepartum -Routine prenatal care  -Anatomy US scheduled for tomorrow   3. Obesity affecting pregnancy in second trimester, unspecified obesity type  4. [redacted] weeks gestation of pregnancy  -Declined AFP/genetic testing today   5. Elevated hemoglobin A1c -Most recent was 5.8 two months ago -GTT as  scheduled around 25 weeks    Preterm labor symptoms and general obstetric precautions including but not limited to vaginal bleeding, contractions, leaking of fluid and fetal movement were reviewed in detail with the patient. Please refer to After Visit Summary for other counseling recommendations.   No follow-ups on file.  Future Appointments  Date Time Provider Department Center  12/07/2022  9:30 AM [facility] NURSE [facility]  12/07/2022  9:45 AM [facility]4 [facility]    Rosario Adie, MS3 [facility]  12/06/22 3:50 PM   Attestation of Attending Supervision of Medical Student: Evaluation and management procedures were performed by the medical student under my supervision and collaboration.  I have reviewed the student's note and chart, and I agree with the management and plan.  Scheryl Darter, MD, FACOG Attending Obstetrician & Gynecologist Faculty Practice, [facility]

## 2022-12-07 ENCOUNTER — Ambulatory Visit (INDEPENDENT_AMBULATORY_CARE_PROVIDER_SITE_OTHER): Payer: Medicaid Other

## 2022-12-07 ENCOUNTER — Ambulatory Visit: Payer: Medicaid Other

## 2022-12-07 VITALS — BP 123/75 | HR 81 | Wt 208.5 lb

## 2022-12-07 DIAGNOSIS — Z0130 Encounter for examination of blood pressure without abnormal findings: Secondary | ICD-10-CM

## 2022-12-07 DIAGNOSIS — R7309 Other abnormal glucose: Secondary | ICD-10-CM

## 2022-12-07 DIAGNOSIS — O99212 Obesity complicating pregnancy, second trimester: Secondary | ICD-10-CM

## 2022-12-07 DIAGNOSIS — O26892 Other specified pregnancy related conditions, second trimester: Secondary | ICD-10-CM

## 2022-12-07 DIAGNOSIS — O0990 Supervision of high risk pregnancy, unspecified, unspecified trimester: Secondary | ICD-10-CM

## 2022-12-07 MED ORDER — BUTALBITAL-APAP-CAFFEINE 50-325-40 MG PO TABS: 1.0000 | ORAL_TABLET | Freq: Four times a day (QID) | ORAL | 0 refills | Status: DC | PRN

## 2022-12-07 NOTE — Patient Instructions (Addendum)
Pick up over the counter for headache prevention: Vitamin B12 Magnesium glycinate  Diabetes education department will call you with an appointment to talk about prediabetes!  Drink lots of water and try to eat a snack every 2 hours.

## 2022-12-07 NOTE — Progress Notes (Signed)
Blood Pressure Check Visit  Emily Cain is here for blood pressure check at 19 weeks pregnancy due to severe headache overnight and this morning. BP today is 123/75. Pt tried aspirin for headache relief but no other interventions. Reviewed with patient that daily baby aspirin is recommended for prevention of elevated BP later in pregnancy; however, PRN aspirin for pain is not recommended during pregnancy. Reviewed with Emily Cain, CNM who gives verbal order for Fioricet. Reviewed this with patient as well as good headache prevention: hydration, frequent snacks, Vitamin B12, Magnesium glycinate. Pt will present to [facility] this evening if she has not had improvement of headache with 2 doses of Fioricet.  Pt expresses feeling upset about recent prediabetes diagnosis. Offered nutrition education appt if desired; pt agrees. Visit scheduled for 12/09/22 and pt notified via MyChart. Offered [facility] appt with Emily Cain for processing of current stressors and history of pregnancy loss.   Marjo Bicker, RN 12/07/2022  10:37 AM

## 2022-12-09 ENCOUNTER — Other Ambulatory Visit: Payer: Medicaid Other

## 2022-12-09 NOTE — BH Specialist Note (Signed)
Pt did not arrive to video visit and did not answer the phone; Unable to leave voicemail as phone is not in service; left MyChart message for patient.

## 2022-12-09 NOTE — Progress Notes (Deleted)
Medical Nutrition Therapy Pre-Diabetes  Appointment Start time:  ***  Appointment End time:  ***  EDD:05/03/23; [redacted]w[redacted]d  Next Ob 01/05/23 with Anyanwu  Primary concerns today: ***  Referral diagnosis: *** Preferred learning style: no preference indicated Learning readiness: *** (not ready, contemplating, ready, change in progress)  NUTRITION ASSESSMENT  Anthropometrics  Wt Readings from Last 3 Encounters:  12/07/22 208 lb 8 oz (94.6 kg)  12/06/22 207 lb 7 oz (94.1 kg)  11/08/22 206 lb 14.4 oz (93.8 kg)   Clinical Medical Hx: prediabetes Medications: reviewed Labs: Lab Results  Component Value Date   HGBA1C 5.8 (H) 10/07/2022   Notable Signs/Symptoms: ***  Lifestyle & Dietary Hx ***  Estimated daily fluid intake: *** oz Supplements: *** Sleep: *** Stress / self-care: *** Current average weekly physical activity: ***  24-Hr Dietary Recall First Meal: *** Snack: *** Second Meal: *** Snack: *** Third Meal: *** Snack: *** Beverages: ***  Estimated Energy Needs Calories: ***  NUTRITION DIAGNOSIS  {CHL AMB NUTRITIONAL [phone]}  NUTRITION INTERVENTION  Nutrition education (E-1) on the following topics:  ***  Handouts Provided Include  A1c chart MyPlate for moms MyPlate for diabetes  Learning Style & Readiness for Change Teaching method utilized: Visual & Auditory  Demonstrated degree of understanding via: Teach Back  Barriers to learning/adherence to lifestyle change: ***  Goals Established by Pt ***   MONITORING & EVALUATION Dietary intake, weekly physical activity, and *** in ***.

## 2022-12-15 ENCOUNTER — Encounter: Payer: Self-pay | Admitting: *Deleted

## 2022-12-15 ENCOUNTER — Ambulatory Visit (HOSPITAL_BASED_OUTPATIENT_CLINIC_OR_DEPARTMENT_OTHER): Payer: Medicaid Other

## 2022-12-15 ENCOUNTER — Ambulatory Visit: Payer: Medicaid Other | Attending: Obstetrics and Gynecology | Admitting: *Deleted

## 2022-12-15 ENCOUNTER — Ambulatory Visit: Payer: Self-pay | Admitting: Clinical

## 2022-12-15 ENCOUNTER — Ambulatory Visit: Payer: Medicaid Other | Attending: Obstetrics and Gynecology | Admitting: Obstetrics and Gynecology

## 2022-12-15 VITALS — BP 111/60 | HR 96

## 2022-12-15 DIAGNOSIS — E669 Obesity, unspecified: Secondary | ICD-10-CM | POA: Diagnosis not present

## 2022-12-15 DIAGNOSIS — Q600 Renal agenesis, unilateral: Secondary | ICD-10-CM

## 2022-12-15 DIAGNOSIS — Z3A19 19 weeks gestation of pregnancy: Secondary | ICD-10-CM

## 2022-12-15 DIAGNOSIS — Z363 Encounter for antenatal screening for malformations: Secondary | ICD-10-CM | POA: Diagnosis not present

## 2022-12-15 DIAGNOSIS — O99212 Obesity complicating pregnancy, second trimester: Secondary | ICD-10-CM | POA: Insufficient documentation

## 2022-12-15 DIAGNOSIS — O09292 Supervision of pregnancy with other poor reproductive or obstetric history, second trimester: Secondary | ICD-10-CM | POA: Insufficient documentation

## 2022-12-15 DIAGNOSIS — O35EXX0 Maternal care for other (suspected) fetal abnormality and damage, fetal genitourinary anomalies, not applicable or unspecified: Secondary | ICD-10-CM

## 2022-12-15 DIAGNOSIS — O0990 Supervision of high risk pregnancy, unspecified, unspecified trimester: Secondary | ICD-10-CM

## 2022-12-15 DIAGNOSIS — O09212 Supervision of pregnancy with history of pre-term labor, second trimester: Secondary | ICD-10-CM | POA: Diagnosis not present

## 2022-12-15 DIAGNOSIS — O0992 Supervision of high risk pregnancy, unspecified, second trimester: Secondary | ICD-10-CM | POA: Insufficient documentation

## 2022-12-15 DIAGNOSIS — Z91199 Patient's noncompliance with other medical treatment and regimen due to unspecified reason: Secondary | ICD-10-CM

## 2022-12-15 DIAGNOSIS — O283 Abnormal ultrasonic finding on antenatal screening of mother: Secondary | ICD-10-CM | POA: Insufficient documentation

## 2022-12-15 DIAGNOSIS — O09293 Supervision of pregnancy with other poor reproductive or obstetric history, third trimester: Secondary | ICD-10-CM

## 2022-12-16 ENCOUNTER — Other Ambulatory Visit: Payer: Self-pay | Admitting: *Deleted

## 2022-12-16 DIAGNOSIS — O35EXX0 Maternal care for other (suspected) fetal abnormality and damage, fetal genitourinary anomalies, not applicable or unspecified: Secondary | ICD-10-CM

## 2022-12-16 NOTE — Progress Notes (Signed)
Maternal-Fetal Medicine   Name: Yuliet Poirot DOB: 08-04-1996 MRN: [id number] Referring Provider: Scheryl Darter, MD  I had the pleasure of seeing Ms. Hanaway [facility]. She is G4 P2011 at 19-6 days' gestation and is here for fetal anatomy scan. Patient had opted not to screen for fetal aneuploidies.  Obstetrical history significant for a term intrauterine fetal demise in 2017 of a female infant.  The cause of stillbirth was unexplained.  She did not have diabetes or hypertension.  Thrombophilia workup was negative.  Infectious screening ruled out toxoplasmosis and CMV infections.  Subsequently, in 2018, she had a term vaginal delivery of a female infant weighing 6 pounds and 8 ounces at birth.  Her daughter is in good health.  Patient reports history of irregular periods.  Ultrasound We performed a fetal anatomical survey.  Amniotic fluid is normal and good fetal activity seen.  Fetal biometry is consistent with 19 weeks and 6 days gestation.  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  Left kidney appears normal.  No clear evidence of pelvic kidney at this gestational age.  No other markers of aneuploidies or fetal structural defects are seen.  Cardiac anatomy appears normal.  We have amended her EDD at 05/05/2023.  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  In the absence of associated anomalies, chromosomal anomalies are rare. However, it can be associated with some chromosomal anomalies (trisomy 21, 10, 7, 22q 11.2 deletion) and genetic syndromes. I offered amniocentesis for fetal chromosome analysis and microarray. Non-invasive prenatal screening detects trisomies 6, 18 and 13 with greater accuracy, but is not diagnostic.    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 I reassured that in the absence of other  anomalies, isolated unilateral renal agenesis is associated with good postnatal outcomes. Patient understands the limitations of ultrasound in detecting fetal anomalies.   Pelvic kidney may be missed in some cases and may be evident only at postnatal evaluation. Pelvic kidneys are identified later in gestation in many cases.  Children born with one kidney are more-likely to have vesicoureteral reflux, and hypertension in later life. Females with renal anomalies can also have associated Mullerian malformations.    After counseling, the patient opted not to have amniocentesis. She will consider cell-free fetal DNA screening at your office.   I recommended fetal echocardiography.   Recommendations -An appointment was made for her to return in 4 weeks for fetal growth and renal assessments. -We have scheduled an appointment for fetal echocardiography ([facility]). -Fetal growth assessments every 4 weeks till delivery.  Thank you for consultation.  If you have any questions or concerns, please contact me the [facility].  Consultation including face-to-face (more than 50%) counseling 30 minutes.

## 2022-12-21 ENCOUNTER — Encounter: Payer: Self-pay | Admitting: Advanced Practice Midwife

## 2022-12-21 DIAGNOSIS — O35EXX0 Maternal care for other (suspected) fetal abnormality and damage, fetal genitourinary anomalies, not applicable or unspecified: Secondary | ICD-10-CM | POA: Insufficient documentation

## 2023-01-05 ENCOUNTER — Encounter: Payer: Medicaid Other | Admitting: Obstetrics & Gynecology

## 2023-01-13 ENCOUNTER — Encounter: Payer: Medicaid Other | Admitting: Obstetrics & Gynecology

## 2023-01-19 ENCOUNTER — Ambulatory Visit: Payer: Medicaid Other

## 2023-02-07 ENCOUNTER — Other Ambulatory Visit: Payer: Self-pay

## 2023-02-07 DIAGNOSIS — O0990 Supervision of high risk pregnancy, unspecified, unspecified trimester: Secondary | ICD-10-CM

## 2023-02-11 ENCOUNTER — Other Ambulatory Visit: Payer: Medicaid Other

## 2023-02-11 ENCOUNTER — Encounter: Payer: Medicaid Other | Admitting: Obstetrics & Gynecology

## 2023-02-11 DIAGNOSIS — O0990 Supervision of high risk pregnancy, unspecified, unspecified trimester: Secondary | ICD-10-CM

## 2023-09-26 ENCOUNTER — Ambulatory Visit: Payer: Medicaid Other | Admitting: Family Medicine

## 2023-09-26 ENCOUNTER — Encounter: Payer: Self-pay | Admitting: Family Medicine

## 2023-09-26 NOTE — Progress Notes (Signed)
 Patient did not keep appointment today. She may call to reschedule.

## 2023-11-05 ENCOUNTER — Encounter (HOSPITAL_COMMUNITY): Payer: Self-pay

## 2023-11-05 ENCOUNTER — Ambulatory Visit (HOSPITAL_COMMUNITY): Admission: EM | Admit: 2023-11-05 | Discharge: 2023-11-05 | Disposition: A | Discharge status: Home / Self Care

## 2023-11-05 DIAGNOSIS — Z113 Encounter for screening for infections with a predominantly sexual mode of transmission: Secondary | ICD-10-CM | POA: Diagnosis present

## 2023-11-05 NOTE — ED Provider Notes (Signed)
  [facility]  Note:  This document was prepared using Dragon voice recognition software and may include unintentional dictation errors.  MRN: [id number] DOB: 1996/06/04  Subjective:   Emily Cain is a 28 y.o. female presenting for routine STD screening and vaginitis screening.  Patient denies any significant symptoms but states after her period she often gets bacterial vaginosis.  Patient states she had a mild ammonia smell coming from her vagina which she equates to bacterial vaginosis.  Patient denies any dysuria, urinary frequency, vaginal discharge, vaginal lesion, flank pain, abdominal pain.  Patient has not taken any over-the-counter treatment for symptoms.  No current facility-administered medications for this encounter. No current outpatient medications on file.   Allergies  Allergen Reactions   Methadone Hives   Flagyl [Metronidazole] Nausea And Vomiting    Past Medical History:  Diagnosis Date   Anemia    Environmental allergies    History of IUFD      Past Surgical History:  Procedure Laterality Date   NO PAST SURGERIES      Family History  Problem Relation Age of Onset   Cancer Maternal Aunt    Cancer Maternal Grandmother     Social History   Tobacco Use   Smoking status: Former    Current packs/day: 0.25    Types: Cigarettes   Smokeless tobacco: Never  Vaping Use   Vaping status: Former  Substance Use Topics   Alcohol use: Not Currently    Comment: wine occasionally   Drug use: No    ROS Refer to HPI for ROS details.  Objective:   Vitals: BP 110/77 (BP Location: Right Arm)   Pulse 62   Temp 97.9 F (36.6 C) (Oral)   Resp 16   Ht 5' 7.5" (1.715 m)   Wt 194 lb (88 kg)   LMP 10/12/2023 (Approximate)   SpO2 97%   Breastfeeding No   BMI 29.94 kg/m   Physical Exam Vitals and nursing note reviewed.  Constitutional:      General: She is not in acute distress.    Appearance: Normal appearance. She is well-developed. She  is not ill-appearing or toxic-appearing.  HENT:     Head: Normocephalic.  Cardiovascular:     Rate and Rhythm: Normal rate.  Pulmonary:     Effort: Pulmonary effort is normal. No respiratory distress.  Genitourinary:    Vagina: No vaginal discharge.  Skin:    General: Skin is warm and dry.  Neurological:     General: No focal deficit present.     Mental Status: She is alert and oriented to person, place, and time.  Psychiatric:        Mood and Affect: Mood normal.     Procedures  No results found for this or any previous visit (from the past 24 hours).  Assessment and Plan :   PDMP not reviewed this encounter.  1. Screen for STD (sexually transmitted disease)    -Vaginal swab collected and sent to lab results should be available in 1 to 2 days.  If results are positive he will be contacted appropriate treatment provided. -Continue to monitor symptoms for any change in severity if there is any escalation of current symptoms or development of new symptoms follow-up in ER for further evaluation and management.  Lucky Cowboy   [place], [place], [place] 11/05/23 1317

## 2023-11-05 NOTE — ED Triage Notes (Signed)
 Patient needing routine STD testing. No current symptoms but states is prone to BV before and after her menstrual. No known exposure.   Patient requesting self swab and blood work.

## 2023-11-07 LAB — CERVICOVAGINAL ANCILLARY ONLY
Bacterial Vaginitis (gardnerella): POSITIVE — AB
Candida Glabrata: NEGATIVE
Candida Vaginitis: NEGATIVE
Chlamydia: NEGATIVE
Comment: NEGATIVE
Comment: NEGATIVE
Comment: NEGATIVE
Comment: NEGATIVE
Comment: NEGATIVE
Comment: NORMAL
Neisseria Gonorrhea: NEGATIVE
Trichomonas: NEGATIVE

## 2023-11-08 ENCOUNTER — Telehealth (HOSPITAL_COMMUNITY): Payer: Self-pay

## 2023-11-08 MED ORDER — METRONIDAZOLE 0.75 % VA GEL: 1.0000 | Freq: Every day | VAGINAL | 0 refills | Status: AC

## 2023-11-08 NOTE — Telephone Encounter (Signed)
Per protocol, pt requires tx with metrogel. Attempted to reach patient x1. LVM.  Rx sent to pharmacy on file.

## 2023-12-14 ENCOUNTER — Other Ambulatory Visit: Payer: Self-pay

## 2023-12-14 ENCOUNTER — Ambulatory Visit
Admission: EM | Admit: 2023-12-14 | Discharge: 2023-12-14 | Disposition: A | Discharge status: Home / Self Care | Attending: Family Medicine | Admitting: Family Medicine

## 2023-12-14 DIAGNOSIS — L0211 Cutaneous abscess of neck: Secondary | ICD-10-CM | POA: Diagnosis not present

## 2023-12-14 MED ORDER — SULFAMETHOXAZOLE-TRIMETHOPRIM 800-160 MG PO TABS: 1.0000 | ORAL_TABLET | Freq: Two times a day (BID) | ORAL | 0 refills | Status: AC

## 2023-12-14 MED ORDER — CEPHALEXIN 500 MG PO CAPS: 500.0000 mg | ORAL_CAPSULE | Freq: Three times a day (TID) | ORAL | 0 refills | Status: AC

## 2023-12-14 NOTE — ED Provider Notes (Signed)
 [facility]    CSN: [id number] Arrival date & time: 12/14/23  1654      History   Chief Complaint Chief Complaint  Patient presents with   Abscess    HPI Emily Cain is a 28 y.o. female presents for evaluation of an abscess.  Patient reports that ingrown hair to the right side of her neck that started 1 week ago.  She tried to remove the hair herself with some tweezers.  She continued to try to squeeze it and opened it up without improvement.  Over the past 2 days she has noticed some green drainage coming from the area.  Denies fevers or chills.  No history of MRSA.  Denies any difficulty swallowing or tongue/throat swelling.  She does states she has been putting baby urine on it without improvement.  Denies pregnancy or breast-feeding.  No other concerns at this time.   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 Authorizing Provider  cephALEXin  (KEFLEX ) 500 MG capsule Take 1 capsule (500 mg total) by mouth 3 (three) times daily for 7 days. 12/14/23 12/21/23 Yes Rosabella Edgin, Jodi R, NP  sulfamethoxazole-trimethoprim (BACTRIM DS) 800-160 MG tablet Take 1 tablet by mouth 2 (two) times daily for 7  days. 12/14/23 12/21/23 Yes Brondon Wann, Jodi R, NP  sertraline  (ZOLOFT ) 25 MG tablet Take 1 tablet (25 mg total) by mouth daily. Patient not taking: Reported on 01/21/2018 07/05/17 04/26/19  Millard Alliance, PA-C    Family History Family History  Problem Relation Age of Onset   Cancer Maternal Aunt    Cancer Maternal Grandmother     Social History Social History   Tobacco Use   Smoking status: Former    Current packs/day: 0.25    Types: Cigarettes   Smokeless tobacco: Never  Vaping Use   Vaping status: Former  Substance Use Topics   Alcohol use: Yes    Comment: wine occasionally   Drug use: No     Allergies   Methadone and Flagyl  [metronidazole ]   Review of Systems Review of Systems  Skin:        Abscess of neck     Physical Exam Triage Vital Signs ED Triage Vitals  Encounter Vitals Group     BP 12/14/23 1736 117/80     Systolic BP Percentile --      Diastolic BP Percentile --  Pulse Rate 12/14/23 1736 70     Resp 12/14/23 1736 16     Temp 12/14/23 1736 99.4 F (37.4 C)     Temp Source 12/14/23 1736 Oral     SpO2 12/14/23 1736 97 %     Weight --      Height --      Head Circumference --      Peak Flow --      Pain Score 12/14/23 1734 7     Pain Loc --      Pain Education --      Exclude from Growth Chart --    No data found.  Updated Vital Signs BP 117/80   Pulse 70   Temp 99.4 F (37.4 C) (Oral)   Resp 16   LMP 11/26/2023   SpO2 97%   Visual Acuity Right Eye Distance:   Left Eye Distance:   Bilateral Distance:    Right Eye Near:   Left Eye Near:    Bilateral Near:     Physical Exam Vitals and nursing note reviewed.  Constitutional:      General: She is not in acute distress.    Appearance: Normal appearance. She is not ill-appearing.  HENT:     Head: Normocephalic and atraumatic.     Mouth/Throat:     Pharynx: Oropharynx is clear. Uvula midline. No pharyngeal swelling.  Eyes:     Pupils: Pupils are equal, round, and reactive to  light.  Neck:      Comments: There is a 2 x 3 indurated nonfluctuant abscess to the right lateral neck just below the jawline.  There is no active drainage.  There is no neck or facial swelling.  No erythema or warmth. Cardiovascular:     Rate and Rhythm: Normal rate.  Pulmonary:     Effort: Pulmonary effort is normal.  Skin:    General: Skin is warm and dry.  Neurological:     General: No focal deficit present.     Mental Status: She is alert and oriented to person, place, and time.  Psychiatric:        Mood and Affect: Mood normal.        Behavior: Behavior normal.      UC Treatments / Results  Labs (all labs ordered are listed, but only abnormal results are displayed) Labs Reviewed - No data to display  EKG   Radiology No results found.  Procedures Procedures (including critical care time)  Medications Ordered in UC Medications - No data to display  Initial Impression / Assessment and Plan / UC Course  I have reviewed the triage vital signs and the nursing notes.  Pertinent labs & imaging results that were available during my care of the patient were reviewed by me and considered in my medical decision making (see chart for details).  Clinical Course as of 12/14/23 1759  Wed Dec 14, 2023  1756 Oral temperature recheck 98.8 [JM]    Clinical Course User Index [JM] Alleen Arbour, NP    I reviewed exam and symptoms with patient.  She is well-appearing and in no acute distress.  She is afebrile in clinic.  Will start Keflex  and Bactrim and advised her warm compresses.  Instructed not to apply any additional baby urine to the area.  Also instructed her not to try to drain it herself.  Given location she was instructed if symptoms do not improve and/or worsen within the next 24 hours she was advised to  go to the emergency room for further workup/imaging and she verbalized understanding. Final Clinical Impressions(s) / UC Diagnoses   Final diagnoses:  Abscess of skin  of neck     Discharge Instructions      Start Bactrim and Keflex .  Do warm compresses to areas it will encourage it to drain on its own.  Do not attempt to drain it yourself.  If your symptoms do not improve within the next 24 hours and/or they worsen i.e. this includes fever or increasing size, please go to the ER ASAP for further workup.  Follow-up with your PCP in 1 to 2 days for recheck.  Hope you feel better soon!    ED Prescriptions     Medication Sig Dispense Auth. Provider   cephALEXin  (KEFLEX ) 500 MG capsule Take 1 capsule (500 mg total) by mouth 3 (three) times daily for 7 days. 21 capsule Cecile Guevara, Jodi R, NP   sulfamethoxazole-trimethoprim (BACTRIM DS) 800-160 MG tablet Take 1 tablet by mouth 2 (two) times daily for 7 days. 14 tablet Ardis Fullwood, Jodi R, NP      PDMP not reviewed this encounter.   Alleen Arbour, NP 12/14/23 1759

## 2023-12-14 NOTE — ED Triage Notes (Signed)
 Pt c/o abscess on right side of neckx6d. Pt states it has gotten bigger. The abscess is approx the size of a quarter. Pt states the abscess has been draining green drainagex3d.

## 2023-12-14 NOTE — Discharge Instructions (Addendum)
 Start Bactrim and Keflex .  Do warm compresses to areas it will encourage it to drain on its own.  Do not attempt to drain it yourself.  If your symptoms do not improve within the next 24 hours and/or they worsen i.e. this includes fever or increasing size, please go to the ER ASAP for further workup.  Follow-up with your PCP in 1 to 2 days for recheck.  Hope you feel better soon!

## 2024-01-26 ENCOUNTER — Encounter (HOSPITAL_COMMUNITY): Payer: Self-pay

## 2024-01-26 ENCOUNTER — Ambulatory Visit (HOSPITAL_COMMUNITY)
Admission: EM | Admit: 2024-01-26 | Discharge: 2024-01-26 | Disposition: A | Discharge status: Home / Self Care | Attending: Family Medicine | Admitting: Family Medicine

## 2024-01-26 DIAGNOSIS — Z113 Encounter for screening for infections with a predominantly sexual mode of transmission: Secondary | ICD-10-CM | POA: Diagnosis not present

## 2024-01-26 LAB — HIV ANTIBODY (ROUTINE TESTING W REFLEX): HIV Screen 4th Generation wRfx: NONREACTIVE

## 2024-01-26 NOTE — ED Triage Notes (Signed)
 Patient requesting routine testing. Denies any current symptoms or exposure. States she is prone to BV so wanting to be tested for that as well. Patient wanting blood work drawn as well.

## 2024-01-26 NOTE — ED Provider Notes (Signed)
 [facility]   [id number] 01/26/24 Arrival Time: 1433  ASSESSMENT & PLAN:  1. Screening for STDs (sexually transmitted diseases)   No empiric treatment.  Labs Pending  HIV ANTIBODY (ROUTINE TESTING W REFLEX)  RPR   Will notify of any positive results. Instructed to refrain from sexual activity for at least seven days.  Reviewed expectations re: course of current medical issues. Questions answered. Outlined signs and symptoms indicating need for more acute intervention. Patient verbalized understanding. After Visit Summary given.   SUBJECTIVE:  Emily Cain is a 28 y.o. female who requests STD testing. No symptoms.   Patient's last menstrual period was 01/17/2024 (approximate).   OBJECTIVE:  Vitals:   01/26/24 1611  BP: 112/77  Pulse: 69  Resp: 16  Temp: 98 F (36.7 C)  TempSrc: Oral  SpO2: 97%  Weight: 86.2 kg  Height: 5' 7.5 (1.715 m)     General appearance: alert, cooperative, appears stated age and no distress GU: deferred Skin: warm and dry Psychological: alert and cooperative; normal mood and affect.    Labs Reviewed  HIV ANTIBODY (ROUTINE TESTING W REFLEX)  RPR  CERVICOVAGINAL ANCILLARY ONLY    Allergies  Allergen Reactions   Methadone Hives   Flagyl  [Metronidazole ] Nausea And Vomiting    Past Medical History:  Diagnosis Date   Anemia    Environmental allergies    History of IUFD    Family History  Problem Relation Age of Onset   Cancer Maternal Aunt    Cancer Maternal Grandmother    Social History   Socioeconomic History   Marital status: Single    Spouse name: Not on file   Number of children: Not on file   Years of education: Not on file   Highest education level: GED or equivalent  Occupational History   Not on file  Tobacco Use   Smoking status: Former    Current packs/day: 0.25    Types: Cigarettes   Smokeless tobacco: Never  Vaping Use   Vaping status: Former  Substance and Sexual Activity   Alcohol  use: Yes    Comment: wine occasionally   Drug use: No   Sexual activity: Yes    Birth control/protection: None  Other Topics Concern   Not on file  Social History Narrative   Not on file   Social Drivers of Health   Financial Resource Strain: Medium Risk (04/16/2023)   Received from [facility]   Overall Financial Resource Strain (CARDIA)    Difficulty of Paying Living Expenses: Somewhat hard  Food Insecurity: No Food Insecurity (04/16/2023)   Received from [facility]   Hunger Vital Sign    Within the past 12 months, you worried that your food would run out before you got the money to buy more.: Never true    Within the past 12 months, the food you bought just didn't last and you didn't have money to get more.: Never true  Transportation Needs: No Transportation Needs (04/16/2023)   Received from [facility]   PRAPARE - Transportation    Lack of Transportation (Medical): No    Lack of Transportation (Non-Medical): No  Physical Activity: Insufficiently Active (10/07/2022)   Exercise Vital Sign    Days of Exercise per Week: 2 days    Minutes of Exercise per Session: 20 min  Stress: No Stress Concern Present (10/07/2022)   [employer] of Occupational Health - Occupational Stress Questionnaire    Feeling of Stress : Not at all  Social Connections: Unknown (10/07/2022)   Social Connection and Isolation Panel    Frequency of Communication with Friends and Family: Twice a week    Frequency of Social Gatherings with Friends and Family: Twice a week    Attends Religious Services: 1 to 4 times per year    Active Member of [employer] or Organizations: No    Attends Banker Meetings: Not on file    Marital Status: Patient declined  Intimate Partner Violence: Unknown (11/12/2021)   Received from [facility]   HITS    Physically Hurt: Not on file    Insult or Talk Down To: Not on file    Threaten Physical Harm: Not on file    Scream or Curse: Not on file            Afton Albright, MD 01/26/24 1747

## 2024-01-27 ENCOUNTER — Ambulatory Visit (HOSPITAL_COMMUNITY): Payer: Self-pay

## 2024-01-27 LAB — RPR: RPR Ser Ql: NONREACTIVE

## 2024-01-27 LAB — CERVICOVAGINAL ANCILLARY ONLY
Bacterial Vaginitis (gardnerella): NEGATIVE
Candida Glabrata: NEGATIVE
Candida Vaginitis: POSITIVE — AB
Chlamydia: NEGATIVE
Comment: NEGATIVE
Comment: NEGATIVE
Comment: NEGATIVE
Comment: NEGATIVE
Comment: NEGATIVE
Comment: NORMAL
Neisseria Gonorrhea: NEGATIVE
Trichomonas: NEGATIVE

## 2024-01-27 MED ORDER — FLUCONAZOLE 150 MG PO TABS: 150.0000 mg | ORAL_TABLET | Freq: Once | ORAL | 0 refills | Status: AC

## 2024-06-30 ENCOUNTER — Ambulatory Visit (HOSPITAL_COMMUNITY): Admission: EM | Admit: 2024-06-30 | Discharge: 2024-06-30 | Disposition: A | Discharge status: Home / Self Care

## 2024-06-30 ENCOUNTER — Other Ambulatory Visit: Payer: Self-pay

## 2024-06-30 ENCOUNTER — Encounter (HOSPITAL_COMMUNITY): Payer: Self-pay | Admitting: *Deleted

## 2024-06-30 DIAGNOSIS — N898 Other specified noninflammatory disorders of vagina: Secondary | ICD-10-CM | POA: Insufficient documentation

## 2024-06-30 DIAGNOSIS — Z3202 Encounter for pregnancy test, result negative: Secondary | ICD-10-CM | POA: Diagnosis not present

## 2024-06-30 LAB — POCT URINALYSIS DIP (MANUAL ENTRY)
Bilirubin, UA: NEGATIVE
Glucose, UA: NEGATIVE mg/dL
Ketones, POC UA: NEGATIVE mg/dL
Leukocytes, UA: NEGATIVE
Nitrite, UA: NEGATIVE
Protein Ur, POC: NEGATIVE mg/dL
Spec Grav, UA: 1.025 (ref 1.010–1.025)
Urobilinogen, UA: 0.2 U/dL
pH, UA: 6 (ref 5.0–8.0)

## 2024-06-30 LAB — POCT URINE PREGNANCY: Preg Test, Ur: NEGATIVE

## 2024-06-30 MED ORDER — METRONIDAZOLE 0.75 % VA GEL: 1.0000 | Freq: Every day | VAGINAL | 0 refills | Status: AC

## 2024-06-30 NOTE — ED Triage Notes (Addendum)
 PT reports she has a white vag discharge for 2-3 days . Pt also thinks she has BV.

## 2024-06-30 NOTE — Discharge Instructions (Signed)
  1. Vaginal discharge (Primary) - POCT urinalysis dipstick completed in UC shows trace blood, no leukocytes, no nitrite, no significant sign of urinary infection.  Trace blood is most likely secondary to urethral irritation from vaginal infection - POCT urine pregnancy completed in UC is negative - Cervicovaginal collected in UC and sent to lab for further testing results should be available in 2 to 3 days.  If test results are positive for any other bacteria or yeast appropriate treatment will be provided according to findings. - metroNIDAZOLE  (METROGEL ) 0.75 % vaginal gel; Place 1 Applicatorful vaginally at bedtime for 5 days.  Dispense: 50 g; Refill: 0 -Continue to monitor symptoms for any change in severity if there is any escalation of current symptoms or development of new symptoms follow-up in ER for further evaluation and management.

## 2024-06-30 NOTE — ED Provider Notes (Signed)
 [facility]  Note:  This document was prepared using Conservation officer, historic buildings and may include unintentional dictation errors.  MRN: [id number] DOB: 01/22/1996  Subjective:   Emily Cain is a 28 y.o. female presenting for Vaginal discharge with abnormal vaginal odor x 2 to 3 days.  Patient denies any dysuria, increased urinary frequency, vaginal bleeding, hematuria, abdominal pain, flank pain, fever.  Patient admits to previous history of bacterial vaginosis and yeast infection.  Patient states that current symptoms reflect previous history of bacterial vaginosis.  Patient denies taking any over-the-counter medication to treat symptoms prior to arrival in urgent care.  Denies any current concern for STI.  No current facility-administered medications for this encounter.  Current Outpatient Medications:    metroNIDAZOLE  (METROGEL ) 0.75 % vaginal gel, Place 1 Applicatorful vaginally at bedtime for 5 days., Disp: 50 g, Rfl: 0   Allergies  Allergen Reactions   Methadone Hives   Flagyl  [Metronidazole ] Nausea And Vomiting    Past Medical History:  Diagnosis Date   Anemia    Environmental allergies    History of IUFD      Past Surgical History:  Procedure Laterality Date   NO PAST SURGERIES      Family History  Problem Relation Age of Onset   Cancer Maternal Aunt    Cancer Maternal Grandmother     Social History   Tobacco Use   Smoking status: Former    Current packs/day: 0.25    Types: Cigarettes   Smokeless tobacco: Never  Vaping Use   Vaping status: Former  Substance Use Topics   Alcohol use: Yes    Comment: wine occasionally   Drug use: No    ROS Refer to HPI for ROS details.  Objective:    Vitals: BP 118/80   Pulse 90   Temp 97.9 F (36.6 C)   Resp 16   LMP 06/11/2024   SpO2 96%   Physical Exam Vitals and nursing note reviewed.  Constitutional:      General: She is not in acute distress.    Appearance: Normal  appearance. She is well-developed. She is not ill-appearing or toxic-appearing.  HENT:     Head: Normocephalic and atraumatic.  Cardiovascular:     Rate and Rhythm: Normal rate.  Pulmonary:     Effort: Pulmonary effort is normal. No respiratory distress.     Breath sounds: No stridor. No wheezing.  Abdominal:     General: Bowel sounds are normal. There is no distension.     Palpations: Abdomen is soft.     Tenderness: There is no abdominal tenderness. There is no right CVA tenderness or left CVA tenderness.  Genitourinary:    Vagina: Vaginal discharge present.  Skin:    General: Skin is warm and dry.  Neurological:     General: No focal deficit present.     Mental Status: She is alert and oriented to person, place, and time.  Psychiatric:        Mood and Affect: Mood normal.        Behavior: Behavior normal.     Procedures  Results for orders placed or performed during the hospital encounter of 06/30/24 (from the past 24 hours)  POCT urinalysis dipstick     Status: Abnormal   Collection Time: 06/30/24  8:36 AM  Result Value Ref Range   Color, UA yellow yellow   Clarity, UA clear clear   Glucose, UA negative negative mg/dL   Bilirubin, UA negative negative  Ketones, POC UA negative negative mg/dL   Spec Grav, UA 8.974 8.989 - 1.025   Blood, UA trace-intact (A) negative   pH, UA 6.0 5.0 - 8.0   Protein Ur, POC negative negative mg/dL   Urobilinogen, UA 0.2 0.2 or 1.0 E.U./dL   Nitrite, UA Negative Negative   Leukocytes, UA Negative Negative  POCT urine pregnancy     Status: None   Collection Time: 06/30/24  8:36 AM  Result Value Ref Range   Preg Test, Ur Negative Negative    Assessment and Plan :     Discharge Instructions       1. Vaginal discharge (Primary) - POCT urinalysis dipstick completed in UC shows trace blood, no leukocytes, no nitrite, no significant sign of urinary infection.  Trace blood is most likely secondary to urethral irritation from vaginal  infection - POCT urine pregnancy completed in UC is negative - Cervicovaginal collected in UC and sent to lab for further testing results should be available in 2 to 3 days.  If test results are positive for any other bacteria or yeast appropriate treatment will be provided according to findings. - metroNIDAZOLE  (METROGEL ) 0.75 % vaginal gel; Place 1 Applicatorful vaginally at bedtime for 5 days.  Dispense: 50 g; Refill: 0 -Continue to monitor symptoms for any change in severity if there is any escalation of current symptoms or development of new symptoms follow-up in ER for further evaluation and management.      Sherrice Creekmore B Deondrae Mcgrail   Madeleine Fenn, [place], [place] 06/30/24 [phone]

## 2024-07-02 ENCOUNTER — Ambulatory Visit (HOSPITAL_COMMUNITY): Payer: Self-pay

## 2024-07-02 LAB — CERVICOVAGINAL ANCILLARY ONLY
Bacterial Vaginitis (gardnerella): POSITIVE — AB
Candida Glabrata: NEGATIVE
Candida Vaginitis: NEGATIVE
Chlamydia: NEGATIVE
Comment: NEGATIVE
Comment: NEGATIVE
Comment: NEGATIVE
Comment: NEGATIVE
Comment: NEGATIVE
Comment: NORMAL
Neisseria Gonorrhea: NEGATIVE
Trichomonas: NEGATIVE

## 2024-07-24 ENCOUNTER — Telehealth (HOSPITAL_COMMUNITY): Payer: Self-pay | Admitting: *Deleted

## 2024-07-24 ENCOUNTER — Telehealth

## 2024-07-24 NOTE — Telephone Encounter (Signed)
 Pt aware and verbalized understanding.

## 2024-07-24 NOTE — Telephone Encounter (Signed)
 Pt states provider told her he would send diflucan  in when he sent Metrogel  but she does not have that rx. Advised pt swab was not positive for yeast so I would need to send to provider on staff today.

## 2024-07-24 NOTE — Telephone Encounter (Signed)
 Patient called stating she would like a prescription for diflucan  as discussed with provider from urgent care visit on 06/30/2024. Vaginal swab was performed at that encounter showing positive result for BV. She was treated with metrogel  per protocol.  Provider note does not mention need for diflucan  prescription.  The patient may return to urgent care for repeat testing if she is having vaginal symptoms and discuss with provider in person for diflucan  prescription if it is deemed necessary.

## 2024-07-25 ENCOUNTER — Telehealth

## 2024-07-25 DIAGNOSIS — B3731 Acute candidiasis of vulva and vagina: Secondary | ICD-10-CM | POA: Diagnosis not present

## 2024-07-25 MED ORDER — FLUCONAZOLE 150 MG PO TABS: 150.0000 mg | ORAL_TABLET | ORAL | 0 refills | Status: DC

## 2024-07-25 NOTE — Patient Instructions (Addendum)
 Emily Cain, thank you for joining Emily CHRISTELLA Barefoot, NP for today's virtual visit.  While this provider is not your primary care provider (PCP), if your PCP is located in our provider database this encounter information will be shared with them immediately following your visit.   A [facility] MyChart account gives you access to today's visit and all your visits, tests, and labs performed at [facility]  click here if you don't have a [facility] MyChart account or go to [email or web address]  Consent: (Patient) Emily Cain provided verbal consent for this virtual visit at the beginning of the encounter.  Current Medications:  Current Outpatient Medications:    fluconazole  (DIFLUCAN ) 150 MG tablet, Take 1 tablet (150 mg total) by mouth as directed. Repeat in 3 days as needed, Disp: 2 tablet, Rfl: 0   Medications ordered in this encounter:  Meds ordered this encounter  Medications   fluconazole  (DIFLUCAN ) 150 MG tablet    Sig: Take 1 tablet (150 mg total) by mouth as directed. Repeat in 3 days as needed    Dispense:  2 tablet    Refill:  0    Supervising Provider:   LAMPTEY, PHILIP Cain [[id number]]     *If you need refills on other medications prior to your next appointment, please contact your pharmacy*  Follow-Up: Call back or seek an in-person evaluation if the symptoms worsen or if the condition fails to improve as anticipated.  [facility] [phone]  Other Instructions   Healthy vaginal hygiene practices   -  Avoid sleeper pajamas. Nightgowns allow air to circulate.  Sleep without underpants whenever possible.  -  Wear cotton underpants during the day. Double-rinse underwear after washing to avoid residual irritants. Do not use fabric softeners for underwear and swimsuits.  - Avoid tights, leotards, leggings, skinny jeans, and other tight-fitting clothing. Skirts and loose-fitting pants allow air to circulate.  - Avoid  pantyliners.  Instead use tampons or cotton pads.  - Use the restroom after intercourse to help prevent UTI's  - Daily warm bathing is helpful:     - Soak in clean water  (no soap) for 10 to 15 minutes. Adding vinegar or baking soda to the water  has not been specifically studied and may not be better than clean water  alone.      - Use soap to wash regions other than the genital area just before getting out of the tub. Limit use of any soap on genital areas. Use fragance-free soaps.     - Rinse the genital area well and gently pat dry.  Don't rub.  Hair dryer to assist with drying can be used only if on cool setting.     - Do not use bubble baths or perfumed soaps.  - Do not use any feminine sprays, douches or powders.  These contain chemicals that will irritate the skin.  - If the genital area is tender or swollen, cool compresses may relieve the discomfort. Unscented wet wipes can be used instead of toilet paper for wiping.   - Emollients, such as Vaseline, may help protect skin and can be applied to the irritated area.  - Always remember to wipe front-to-back after bowel movements. Pat dry after urination.  - Do not sit in wet swimsuits for long periods of time after swimming    If you have been instructed to have an in-person evaluation today at a local Urgent Care facility, please use the link below. It  will take you to a list of all of our available [facility], including address, phone number and hours of operation. Please do not delay care.  [facility]  If you or a family member do not have a primary care provider, use the link below to schedule a visit and establish care. When you choose a [facility] primary care physician or advanced practice provider, you gain a long-term partner in health. Find a Primary Care Provider  Learn more about [facility]'s in-office and virtual care options: [facility] - Get Care Now

## 2024-07-25 NOTE — Progress Notes (Signed)
 Virtual Visit Consent   Emily Cain, you are scheduled for a virtual visit with a [facility] provider today. Just as with appointments in the office, your consent must be obtained to participate. Your consent will be active for this visit and any virtual visit you may have with one of our providers in the next 365 days. If you have a MyChart account, a copy of this consent can be sent to you electronically.  As this is a virtual visit, video technology does not allow for your provider to perform a traditional examination. This may limit your provider's ability to fully assess your condition. If your provider identifies any concerns that need to be evaluated in person or the need to arrange testing (such as labs, EKG, etc.), we will make arrangements to do so. Although advances in technology are sophisticated, we cannot ensure that it will always work on either your end or our end. If the connection with a video visit is poor, the visit may have to be switched to a telephone visit. With either a video or telephone visit, we are not always able to ensure that we have a secure connection.  By engaging in this virtual visit, you consent to the provision of healthcare and authorize for your insurance to be billed (if applicable) for the services provided during this visit. Depending on your insurance coverage, you may receive a charge related to this service.  I need to obtain your verbal consent now. Are you willing to proceed with your visit today? Emily Cain has provided verbal consent on 07/25/2024 for a virtual visit (video or telephone). [name], NP  Date: 07/25/2024 11:41 AM   Virtual Visit via Video Note   I, [name], connected with  Emily Cain  ([id number], Nov 25, 1995) on 07/25/2024 at 11:45 AM EST by a video-enabled telemedicine application and verified that I am speaking with the correct person using two identifiers.  Location: Patient: Virtual Visit Location  Patient: Home Provider: Virtual Visit Location Provider: Home Office   I discussed the limitations of evaluation and management by telemedicine and the availability of in person appointments. The patient expressed understanding and agreed to proceed.    History of Present Illness: Emily Cain is a 28 y.o. who identifies as a female who was assigned female at birth, and is being seen today for yeast infection post getting treatment for BV  Onset was last week- post getting the finished with BV treatment- and had cycle. Now she is having increased in vaginal itching, and white clumpy discharge.   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  Resting comfortably  at home.  Head is normocephalic, atraumatic.  No labored breathing.  Speech is clear and coherent with logical content.  Patient is alert and oriented at baseline.    Assessment and Plan:   1. Yeast vaginitis (Primary)  - fluconazole  (DIFLUCAN ) 150 MG tablet; Take 1 tablet (150 mg total) by mouth  as directed. Repeat in 3 days as needed  Dispense: 2 tablet; Refill: 0  -no other red flags UTI, PID, or BV infection -increase fluids -complete medication as discussed -prevention discussed and on AVS    Reviewed side effects, risks and benefits of medication.    Patient acknowledged agreement and understanding of the plan.    Past Medical, Surgical, Social History, Allergies, and Medications have been Reviewed.     Follow Up Instructions: I discussed the assessment and treatment plan with the patient. The patient was provided an opportunity to ask questions and all were answered. The patient agreed with the plan and demonstrated an understanding of the instructions.  A copy of instructions were sent to the patient via MyChart unless otherwise noted below.    The patient was advised to call back or seek an in-person evaluation if the symptoms worsen or if the condition fails to improve as anticipated.    [name], NP     [1]  Allergies Allergen Reactions   Methadone Hives   Flagyl  [Metronidazole ] Nausea And Vomiting  [2] No current outpatient medications on file.

## 2024-08-28 ENCOUNTER — Ambulatory Visit (HOSPITAL_COMMUNITY)
Admission: EM | Admit: 2024-08-28 | Discharge: 2024-08-28 | Disposition: A | Discharge status: Home / Self Care | Attending: Emergency Medicine | Admitting: Emergency Medicine

## 2024-08-28 ENCOUNTER — Other Ambulatory Visit: Payer: Self-pay

## 2024-08-28 ENCOUNTER — Telehealth

## 2024-08-28 ENCOUNTER — Encounter (HOSPITAL_COMMUNITY): Payer: Self-pay | Admitting: Emergency Medicine

## 2024-08-28 DIAGNOSIS — R109 Unspecified abdominal pain: Secondary | ICD-10-CM

## 2024-08-28 DIAGNOSIS — N939 Abnormal uterine and vaginal bleeding, unspecified: Secondary | ICD-10-CM | POA: Diagnosis not present

## 2024-08-28 DIAGNOSIS — M25511 Pain in right shoulder: Secondary | ICD-10-CM

## 2024-08-28 LAB — POCT URINE DIPSTICK
Bilirubin, UA: NEGATIVE
Glucose, UA: NEGATIVE mg/dL
Leukocytes, UA: NEGATIVE
Nitrite, UA: NEGATIVE
POC PROTEIN,UA: 100 — AB
Spec Grav, UA: 1.02
Urobilinogen, UA: 1 U/dL
pH, UA: 7

## 2024-08-28 LAB — POCT URINE PREGNANCY: Preg Test, Ur: NEGATIVE

## 2024-08-28 MED ORDER — IBUPROFEN 800 MG PO TABS: 800.0000 mg | ORAL_TABLET | Freq: Three times a day (TID) | ORAL | 0 refills | Status: AC

## 2024-08-28 NOTE — ED Notes (Signed)
 Patient unable to provide urine, patient reports she was caught off guard by the passing of a large blood .  Encouraged patient to attempt another urine specimen

## 2024-08-28 NOTE — ED Provider Notes (Signed)
 " [facility]    CSN: [id number] Arrival date & time: 08/28/24  1717      History   Chief Complaint No chief complaint on file.   HPI Emily Cain is a 29 y.o. female.   Patient presents with intermittent abdominal cramping and vaginal bleeding that began shortly after taking an emergency contraceptive pill on 1/14.  Patient reports that she had unprotected sexual intercourse on 1/13.  LMP 1/8.  Patient reports that her vaginal bleeding is heavy at times, but denies going through a pad more than once an hour.  Denies any nausea, vomiting, diarrhea, dizziness, lightheadedness, or fever.  Patient also denies any abnormal vaginal discharge or odor. Patient reports that she has been taking Tylenol  and ibuprofen  with minimal relief when the gripping occurs.  Patient states that she has also been experiencing some right shoulder pain over the last week.  Patient reports that the pain is worse with movement.  Patient reports that she did workout recently feel like she may have strained something when working out.  Patient denies any falls or trauma to the shoulder.  The history is provided by the patient and medical records.    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 Authorizing Provider  ibuprofen  (ADVIL ) 800 MG tablet Take 1 tablet (800 mg total) by mouth 3 (three) times daily. 08/28/24  Yes Johnie, Yamilka Lopiccolo A, NP  sertraline  (ZOLOFT ) 25 MG tablet Take 1 tablet (25 mg total) by mouth daily. Patient not taking: Reported on 01/21/2018 07/05/17 04/26/19  Larwence Mliss SAILOR, PA-C    Family History Family History  Problem Relation Age of Onset   Cancer Maternal Aunt    Cancer Maternal Grandmother     Social History Social History[1]   Allergies   Methadone and Flagyl  [metronidazole ]   Review of Systems Review of Systems  Per HPI  Physical Exam Triage Vital Signs ED Triage Vitals  Encounter Vitals Group     BP 08/28/24 1839 113/66     Girls Systolic BP Percentile --      Girls Diastolic BP Percentile --      Boys Systolic BP Percentile --      Boys Diastolic BP Percentile --      Pulse Rate 08/28/24 1839 80     Resp 08/28/24 1839 18     Temp 08/28/24 1839 97.9 F (36.6 C)  Temp Source 08/28/24 1839 Oral     SpO2 08/28/24 1839 98 %     Weight --      Height --      Head Circumference --      Peak Flow --      Pain Score 08/28/24 1836 9     Pain Loc --      Pain Education --      Exclude from Growth Chart --    No data found.  Updated Vital Signs BP 113/66 (BP Location: Left Arm)   Pulse 80   Temp 97.9 F (36.6 C) (Oral)   Resp 18   LMP 08/16/2024   SpO2 98%   Visual Acuity Right Eye Distance:   Left Eye Distance:   Bilateral Distance:    Right Eye Near:   Left Eye Near:    Bilateral Near:     Physical Exam Vitals and nursing note reviewed.  Constitutional:      General: She is awake. She is not in acute distress.    Appearance: Normal appearance. She is well-developed and well-groomed. She is not ill-appearing.  Cardiovascular:     Rate and Rhythm: Normal rate and regular rhythm.  Pulmonary:     Effort: Pulmonary effort is normal.     Breath  sounds: Normal breath sounds.  Abdominal:     General: Abdomen is flat. Bowel sounds are normal.     Palpations: Abdomen is soft.     Tenderness: There is abdominal tenderness in the suprapubic area. There is no right CVA tenderness, left CVA tenderness, guarding or rebound. Negative signs include Rovsing's sign and McBurney's sign.     Comments: Mild tenderness noted to suprapubic region  Musculoskeletal:     Right shoulder: No swelling, deformity, tenderness or bony tenderness. Normal range of motion. Normal strength. Normal pulse.     Comments: No significant findings noted to the shoulder at this time.  Patient endorses mild pain with movement.  Skin:    General: Skin is warm and dry.  Neurological:     General: No focal deficit present.     Mental Status: She is alert and oriented to person, place, and time. Mental status is at baseline.  Psychiatric:        Behavior: Behavior is cooperative.      UC Treatments / Results  Labs (all labs ordered are listed, but only abnormal results are displayed) Labs Reviewed  POCT URINE DIPSTICK - Abnormal; Notable for the following components:      Result Value   Color, UA other (*)    Clarity, UA cloudy (*)    Ketones, POC UA trace (5) (*)    Blood, UA large (*)    POC PROTEIN,UA =100 (*)    All other components within normal limits  POCT URINE PREGNANCY    EKG   Radiology No results found.  Procedures Procedures (including critical care time)  Medications Ordered in UC Medications - No data to display  Initial Impression / Assessment and Plan / UC Course  I have reviewed the triage vital signs and the nursing notes.  Pertinent labs & imaging results that were available during my care of the patient were reviewed by me and considered in my medical decision making (see chart for details).     Patient is overall well-appearing.  Vitals are stable.  UPT negative, urinalysis unremarkable.  Abdominal cramping and vaginal  bleeding likely related to taking emergency contraceptive pill.  Prescribed ibuprofen   to alternate with Tylenol  as needed for abdominal cramping.  Suspect shoulder pain is likely muscular in nature.  Recommended ibuprofen  and Tylenol  for this as well.  Recommended alternate between ice and heat as needed for pain.  Discussed follow-up, return, and strict ER precautions. Final Clinical Impressions(s) / UC Diagnoses   Final diagnoses:  Abdominal cramping  Vaginal bleeding  Acute pain of right shoulder     Discharge Instructions      As discussed it is very common to develop abdominal cramping and vaginal bleeding after taking Plan B  or emergency contraceptive pill.  If your bleeding were to become severely heavy meaning that you are going through a pad or tampon more than once an hour please seek any medical treatment in the emergency department. You can take 800 mg ibuprofen  every 8 hours as needed for pain.  You can take 650 of Tylenol  every 6-8 hours as needed for breakthrough pain.  You can also try using a heating pad for pain. I believe your shoulder pain is likely muscular in nature.  You can also take Tylenol  ibuprofen  for this.  You can also try alternate between ice and heat as needed for pain. Follow-up with your primary care provider or return here as needed.   ED Prescriptions     Medication Sig Dispense Auth. Provider   ibuprofen  (ADVIL ) 800 MG tablet Take 1 tablet (800 mg total) by mouth 3 (three) times daily. 21 tablet Johnie Flaming A, NP      PDMP not reviewed this encounter.    [1]  Social History Tobacco Use   Smoking status: Former    Current packs/day: 0.25    Types: Cigarettes   Smokeless tobacco: Never  Vaping Use   Vaping status: Former  Substance Use Topics   Alcohol use: Yes    Comment: wine occasionally   Drug use: No     Johnie Flaming LABOR, NP 08/28/24 1946  "

## 2024-08-28 NOTE — Discharge Instructions (Addendum)
 As discussed it is very common to develop abdominal cramping and vaginal bleeding after taking Plan B  or emergency contraceptive pill.  If your bleeding were to become severely heavy meaning that you are going through a pad or tampon more than once an hour please seek any medical treatment in the emergency department. You can take 800 mg ibuprofen  every 8 hours as needed for pain.  You can take 650 of Tylenol  every 6-8 hours as needed for breakthrough pain.  You can also try using a heating pad for pain. I believe your shoulder pain is likely muscular in nature.  You can also take Tylenol  ibuprofen  for this.  You can also try alternate between ice and heat as needed for pain. Follow-up with your primary care provider or return here as needed.

## 2024-08-28 NOTE — ED Triage Notes (Signed)
 Complains of abdominal cramping on left side of abdomen.  Lmp was 08/16/2024.  Took a contraceptive 08/22/2024-your choice EC pill was the contraceptive taken. Reports vaginal bleeding.    Also complaining of right shoulder pain that worsens with movement.  Patient did a new workout recently
